# Patient Record
Sex: Female | Born: 1955 | Hispanic: No | Marital: Married | Smoking: Current every day smoker
Health system: Southern US, Community
[De-identification: ages and names within clinical notes are randomized; demographics above are authoritative.]

## PROBLEM LIST (undated history)

## (undated) DIAGNOSIS — I10 Essential (primary) hypertension: Secondary | ICD-10-CM

## (undated) DIAGNOSIS — F0280 Dementia in other diseases classified elsewhere without behavioral disturbance: Secondary | ICD-10-CM

## (undated) DIAGNOSIS — E7800 Pure hypercholesterolemia, unspecified: Secondary | ICD-10-CM

## (undated) DIAGNOSIS — E785 Hyperlipidemia, unspecified: Secondary | ICD-10-CM

## (undated) DIAGNOSIS — G309 Alzheimer's disease, unspecified: Secondary | ICD-10-CM

## (undated) HISTORY — DX: Essential (primary) hypertension: I10

## (undated) HISTORY — DX: Pure hypercholesterolemia, unspecified: E78.00

## (undated) HISTORY — DX: Hyperlipidemia, unspecified: E78.5

## (undated) HISTORY — DX: Dementia in other diseases classified elsewhere, unspecified severity, without behavioral disturbance, psychotic disturbance, mood disturbance, and anxiety: F02.80

## (undated) HISTORY — DX: Alzheimer's disease, unspecified: G30.9

---

## 1998-01-01 ENCOUNTER — Other Ambulatory Visit: Admission: RE | Admit: 1998-01-01 | Discharge: 1998-01-01 | Payer: Self-pay | Admitting: Obstetrics and Gynecology

## 1999-01-03 ENCOUNTER — Other Ambulatory Visit: Admission: RE | Admit: 1999-01-03 | Discharge: 1999-01-03 | Payer: Self-pay | Admitting: Obstetrics and Gynecology

## 2000-01-09 ENCOUNTER — Other Ambulatory Visit: Admission: RE | Admit: 2000-01-09 | Discharge: 2000-01-09 | Payer: Self-pay | Admitting: Obstetrics and Gynecology

## 2008-05-05 ENCOUNTER — Encounter: Payer: Self-pay | Admitting: Gastroenterology

## 2008-05-16 ENCOUNTER — Encounter: Payer: Self-pay | Admitting: Gastroenterology

## 2008-09-03 ENCOUNTER — Ambulatory Visit: Payer: Self-pay | Admitting: Gastroenterology

## 2008-09-27 ENCOUNTER — Ambulatory Visit: Payer: Self-pay | Admitting: Gastroenterology

## 2008-10-09 ENCOUNTER — Telehealth (INDEPENDENT_AMBULATORY_CARE_PROVIDER_SITE_OTHER): Payer: Self-pay | Admitting: *Deleted

## 2008-10-10 ENCOUNTER — Ambulatory Visit: Payer: Self-pay | Admitting: Gastroenterology

## 2008-10-11 ENCOUNTER — Telehealth (INDEPENDENT_AMBULATORY_CARE_PROVIDER_SITE_OTHER): Payer: Self-pay | Admitting: *Deleted

## 2008-10-17 ENCOUNTER — Encounter (INDEPENDENT_AMBULATORY_CARE_PROVIDER_SITE_OTHER): Payer: Self-pay | Admitting: *Deleted

## 2008-11-02 ENCOUNTER — Ambulatory Visit: Payer: Self-pay | Admitting: Gastroenterology

## 2008-11-06 ENCOUNTER — Ambulatory Visit: Payer: Self-pay | Admitting: Gastroenterology

## 2012-08-19 DIAGNOSIS — Z0289 Encounter for other administrative examinations: Secondary | ICD-10-CM

## 2013-10-11 ENCOUNTER — Encounter (INDEPENDENT_AMBULATORY_CARE_PROVIDER_SITE_OTHER): Payer: Self-pay

## 2013-10-11 ENCOUNTER — Encounter: Payer: Self-pay | Admitting: Neurology

## 2013-10-11 ENCOUNTER — Ambulatory Visit (INDEPENDENT_AMBULATORY_CARE_PROVIDER_SITE_OTHER): Payer: 59 | Admitting: Neurology

## 2013-10-11 VITALS — BP 113/70 | HR 89 | Temp 98.6°F | Ht 63.5 in | Wt 229.0 lb

## 2013-10-11 DIAGNOSIS — G478 Other sleep disorders: Secondary | ICD-10-CM

## 2013-10-11 DIAGNOSIS — G4733 Obstructive sleep apnea (adult) (pediatric): Secondary | ICD-10-CM

## 2013-10-11 DIAGNOSIS — F0280 Dementia in other diseases classified elsewhere without behavioral disturbance: Secondary | ICD-10-CM

## 2013-10-11 DIAGNOSIS — R400 Somnolence: Secondary | ICD-10-CM

## 2013-10-11 DIAGNOSIS — G309 Alzheimer's disease, unspecified: Secondary | ICD-10-CM

## 2013-10-11 DIAGNOSIS — F518 Other sleep disorders not due to a substance or known physiological condition: Secondary | ICD-10-CM

## 2013-10-11 DIAGNOSIS — G4710 Hypersomnia, unspecified: Secondary | ICD-10-CM

## 2013-10-11 DIAGNOSIS — E669 Obesity, unspecified: Secondary | ICD-10-CM

## 2013-10-11 NOTE — Patient Instructions (Signed)

## 2013-10-11 NOTE — Progress Notes (Signed)
Subjective:    Patient ID: Barbara Gill is a 58 y.o. female.  HPI    Star Age, MD, PhD [facility] [address] [place], [place] [address]  Dear Dr. Brigitte Pulse,  I saw your patient, Barbara Gill, upon your kind request in my neurologic clinic today for initial consultation of her sleep disorder, in particular, concern for underlying obstructive sleep apnea. The patient is accompanied by her husband today. As you know, Barbara Gill is a 58 year old right-handed woman with an underlying medical history of hypertension, hyperlipidemia, smoking, advanced Alderfer onset AD, anxiety, depression, ADD and chronic back pain, as well as severe obesity, who reports snoring and daytime somnolence. She has gained a lot of weight in the realm of 70 lb in the last year. 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  Her father had OSA and her brother has OSA. She has had some breathing pauses. She does not have a morning headache. She is a restless sleeper. She denies RLS symptoms and does not kick in her sleep. She talks in her sleep. She gets up at night 2 times per night, and sometimes goes to the living room and sleeps on the sofa. She goes to bed around 11 PM and falls asleep quickly. Her wake time is around 7-9 AM. She feels sluggish in the morning and naps during the day. She seems to sleep more in the last 18 months.    She denies cataplexy, sleep paralysis, hypnagogic or hypnopompic hallucinations, or sleep attacks. She does not report any vivid dreams, nightmares, dream enactments, or parasomnias, such as sleep talking or sleep walking. The patient has not had a sleep study or a home sleep test.  Her bedroom is usually dark and cool. There is no TV in the bedroom.   Her Past Medical History Is Significant For: Past Medical History  Diagnosis Date  . Hypertension   . High cholesterol   . Alzheimer's disease   . Hyperlipemia     Her Past  Surgical History Is Significant For: History reviewed. No pertinent past surgical history.  Her Family History Is Significant For: Family History  Problem Relation Age of Onset  . Cancer Father   . Hypothyroidism Father   . Hypertension Mother     borderline  . Diabetes Mother   . Breast cancer Mother     Her Social History Is Significant For: History   Social History  . Marital Status: Married    Spouse Name: [name]    Number of Children: 2  . Years of Education: 16   Occupational History  .      not employed   Social History Main Topics  . Smoking status: Current Every Day Smoker  . Smokeless tobacco: Never Used     Comment: 4 packs weekly  . Alcohol Use: Yes     Comment: occas.,once every two months  . Drug Use: No  . Sexual Activity: None   Other Topics Concern  . None   Social History Narrative   Patient is right handed and resides with husband    Her Allergies Are:  Allergies  Allergen Reactions  . Seasonal Ic [Cholestatin]     Runny nose, itchy eyes  . Poison Ivy Extract [Extract Of Poison Ivy] Rash  :   Her Current Medications Are:  Outpatient Encounter Prescriptions as of 10/11/2013  Medication Sig  . aspirin 81 MG tablet Take 81 mg by  mouth daily.  Marland Kitchen atorvastatin (LIPITOR) 20 MG tablet Take 20 mg by mouth at bedtime.  . Calcium-Vit D3-Phytosterols ([place]) [id number] MG-UNIT-MG TABS Take by mouth daily.  Marland Kitchen donepezil (ARICEPT) 10 MG tablet Take 10 mg by mouth at bedtime.  Marland Kitchen escitalopram (LEXAPRO) 10 MG tablet Take 10 mg by mouth daily.  . hydrochlorothiazide (HYDRODIURIL) 25 MG tablet Take 25 mg by mouth daily.  . Hydrocortisone (CORTIZONE-10 ECZEMA EX) Apply 1 % topically as needed.  . Memantine HCl ER (NAMENDA XR) 28 MG CP24 Take 28 mg by mouth daily.  . Multiple Vitamin (MULTIVITAMIN) capsule Take 1 capsule by mouth daily.  . Omega-3 Krill Oil 1000 MG CAPS Take by mouth daily.  Marland Kitchen telmisartan (MICARDIS) 80 MG tablet Take 80  mg by mouth daily.  :  Review of Systems:  Out of a complete 14 point review of systems, all are reviewed and negative with the exception of these symptoms as listed below:   Review of Systems  Constitutional: Positive for fatigue.       Weight gain  Respiratory: Positive for cough and wheezing.   Musculoskeletal:       Joint pain  Neurological:       Memory loss, confusion,slurred speech,snoring  Psychiatric/Behavioral:       Depression,anxiety,too much sleep    Objective:  Neurologic Exam  Physical Exam Physical Examination:   Filed Vitals:   10/11/13 1112  BP: 113/70  Pulse: 89  Temp: 98.6 F (37 C)    General Examination: The patient is a very pleasant 58 y.o. female in no acute distress. She appears well-developed and well-nourished and well groomed.   HEENT: Normocephalic, atraumatic, pupils are equal, round and reactive to light and accommodation. Funduscopic exam is normal with sharp disc margins noted. Extraocular tracking is good without limitation to gaze excursion or nystagmus noted. Normal smooth pursuit is noted. Hearing is grossly intact. Tympanic membranes are clear bilaterally. Face is symmetric with normal facial animation and normal facial sensation. Speech is clear with no dysarthria noted. There is no hypophonia. There is no lip, neck/head, jaw or voice tremor. Neck is supple with full range of passive and active motion. There are no carotid bruits on auscultation. Oropharynx exam reveals: mild mouth dryness, adequate dental hygiene and moderate airway crowding, due to larger tongue and thick soft palate and tonsils in place. Mallampati is class III. Tongue protrudes centrally and palate elevates symmetrically. Tonsils are 1+ in size. Neck size is 16.5 inches. She has a very mild overbite. Nasal inspection reveals no significant nasal mucosal bogginess or redness and no septal deviation.   Chest: Clear to auscultation without wheezing, rhonchi or crackles  noted.  Heart: S1+S2+0, regular and normal without murmurs, rubs or gallops noted.   Abdomen: Soft, non-tender and non-distended with normal bowel sounds appreciated on auscultation.  Extremities: There is no pitting edema in the distal lower extremities bilaterally. Pedal pulses are intact.  Skin: Warm and dry without trophic changes noted. There are no varicose veins.  Musculoskeletal: exam reveals no obvious joint deformities, tenderness or joint swelling or erythema.   Neurologically:  Mental status: The patient is awake, alert and oriented to self only. Her immediate is significantly impaired and remote memory seems good. There is no evidence of aphasia, agnosia, apraxia or anomia. Speech is clear with normal prosody and enunciation. Thought process is linear. Mood is normal and affect is normal.  Cranial nerves II - XII are as described above under HEENT  exam. In addition: shoulder shrug is normal with equal shoulder height noted. Motor exam: Normal bulk, strength and tone is noted. There is no drift, tremor or rebound. Romberg is negative. Reflexes are 2+ throughout. Babinski: Toes are flexor bilaterally. Fine motor skills and coordination: intact with normal finger taps, normal hand movements, normal rapid alternating patting, normal foot taps and normal foot agility.  Cerebellar testing: No dysmetria or intention tremor on finger to nose testing. Heel to shin is unremarkable bilaterally. There is no truncal or gait ataxia.  Sensory exam: intact to light touch, pinprick, vibration, temperature sense in the upper and lower extremities.  Gait, station and balance: She stands easily. No veering to one side is noted. No leaning to one side is noted. Posture is age-appropriate and stance is narrow based. Gait shows normal stride length and normal pace. No problems turning are noted. She turns en bloc. Tandem walk is unremarkable. Intact toe and heel stance is noted.               Assessment  and Plan:   In summary, JAYLAA GALLION is a very pleasant 59 y.o.-year old female with a history and physical exam concerning for obstructive sleep apnea (OSA). I had a long chat with the patient and her husband about my findings and the diagnosis of OSA, its prognosis and treatment options. We talked about medical treatments, surgical interventions and non-pharmacological approaches. I explained in particular the risks and ramifications of untreated moderate to severe OSA, especially with respect to developing cardiovascular disease down the Road, including congestive heart failure, difficult to treat hypertension, cardiac arrhythmias, or stroke. Even type 2 diabetes has, in part, been linked to untreated OSA. Symptoms of untreated OSA include daytime sleepiness, memory problems, mood irritability and mood disorder such as depression and anxiety, lack of energy, as well as recurrent headaches, especially morning headaches. We talked about smoking cessation and trying to maintain a healthy lifestyle in general, as well as the importance of weight control. I encouraged the patient to eat healthy, exercise daily and keep well hydrated, to keep a scheduled bedtime and wake time routine, to not skip any meals and eat healthy snacks in between meals. I advised the patient not to drive when feeling sleepy. I recommended the following at this time: sleep study with potential positive airway pressure titration. (We will score hypopneas at 4% and split the sleep study into diagnostic and treatment portion, if the estimated. 2 hour AHI is >20/h). Her husband will have to stay with her due to her memory disorder.   I explained the sleep test procedure to the patient and also outlined possible surgical and non-surgical treatment options of OSA, including the use of a custom-made dental device (which would require a referral to a specialist dentist or oral surgeon), upper airway surgical options, such as pillar implants,  radiofrequency surgery, tongue base surgery, and UPPP (which would involve a referral to an ENT surgeon). Rarely, jaw surgery such as mandibular advancement may be considered.  I also explained the CPAP treatment option to the patient, who indicated that she would be willing to try CPAP if the need arises. I explained the importance of being compliant with PAP treatment, not only for insurance purposes but primarily to improve Her symptoms, and for the patient's long term health benefit, including to reduce Her cardiovascular risks. I answered all their questions today and the patient and her husband were in agreement. I would like to see her back after  the sleep study is completed and encouraged her to call with any interim questions, concerns, problems or updates.   Thank you very much for allowing me to participate in the care of this nice patient. If I can be of any further assistance to you please do not hesitate to call me at [phone].  Sincerely,   Star Age, MD, PhD

## 2013-11-27 ENCOUNTER — Ambulatory Visit (INDEPENDENT_AMBULATORY_CARE_PROVIDER_SITE_OTHER): Payer: 59 | Admitting: Neurology

## 2013-11-27 DIAGNOSIS — G479 Sleep disorder, unspecified: Secondary | ICD-10-CM

## 2013-11-27 DIAGNOSIS — G4733 Obstructive sleep apnea (adult) (pediatric): Secondary | ICD-10-CM

## 2013-11-27 DIAGNOSIS — G4761 Periodic limb movement disorder: Secondary | ICD-10-CM

## 2013-11-27 DIAGNOSIS — G4720 Circadian rhythm sleep disorder, unspecified type: Secondary | ICD-10-CM

## 2013-11-27 DIAGNOSIS — G4734 Idiopathic sleep related nonobstructive alveolar hypoventilation: Secondary | ICD-10-CM

## 2013-11-27 NOTE — Sleep Study (Signed)
Please see the scanned sleep study interpretation located in the Procedure tab within the Chart Review section. 

## 2013-12-06 ENCOUNTER — Telehealth: Payer: Self-pay | Admitting: Neurology

## 2013-12-06 DIAGNOSIS — G4733 Obstructive sleep apnea (adult) (pediatric): Secondary | ICD-10-CM

## 2013-12-06 NOTE — Telephone Encounter (Signed)
Please call and notify the husband that the recent sleep study did confirm the diagnosis of obstructive sleep apnea and that I recommend treatment for this in the form of CPAP. This will require a repeat sleep study for proper titration and mask fitting. Please explain to patient - better husband - and arrange for a CPAP titration study. Please try to find an appointment as soon as possible for titration because of significant oxygen desaturations. I have placed an order in the chart. Thanks, Star Age, MD, PhD [facility])

## 2013-12-07 ENCOUNTER — Encounter: Payer: Self-pay | Admitting: Neurology

## 2013-12-07 NOTE — Telephone Encounter (Signed)
Patient was contacted and provided the results of her sleep study via her son [name] which revealed obstructive sleep apnea.  Patient's son was informed a CPAP titration study had been recommended and an appointment was made for Friday Dec. 18th at 08:00 pm.  Patient was mailed a copy of the results and a copy was sent to Dr. Marton Redwood.

## 2014-01-12 ENCOUNTER — Ambulatory Visit (INDEPENDENT_AMBULATORY_CARE_PROVIDER_SITE_OTHER): Payer: 59 | Admitting: Neurology

## 2014-01-12 VITALS — BP 144/80 | HR 88

## 2014-01-12 DIAGNOSIS — G4761 Periodic limb movement disorder: Secondary | ICD-10-CM

## 2014-01-12 DIAGNOSIS — G479 Sleep disorder, unspecified: Secondary | ICD-10-CM

## 2014-01-12 DIAGNOSIS — G4733 Obstructive sleep apnea (adult) (pediatric): Secondary | ICD-10-CM

## 2014-01-13 NOTE — Sleep Study (Signed)
Please see the attached sleep study interpretation located in the Procedure tab within the chart review section

## 2014-01-22 ENCOUNTER — Telehealth: Payer: Self-pay | Admitting: Neurology

## 2014-01-22 ENCOUNTER — Encounter: Payer: Self-pay | Admitting: Neurology

## 2014-01-22 DIAGNOSIS — G4733 Obstructive sleep apnea (adult) (pediatric): Secondary | ICD-10-CM

## 2014-01-22 NOTE — Telephone Encounter (Signed)
Please call and inform patient that I have entered an order for treatment with PAP. She did well during the latest sleep study with CPAP. We will, therefore, arrange for a machine for home use through a DME (durable medical equipment) company of Her choice; and I will see the patient back in follow-up in about 6 weeks. Please also explain to the patient that I will be looking out for compliance data downloaded from the machine, which can be done remotely through a modem at times or stored on an SD card in the back of the machine. At the time of the followup appointment we will discuss sleep study results and how it is going with PAP treatment at home. Please advise patient to bring Her machine at the time of the visit; at least for the first visit, even though this is cumbersome. Bringing the machine for every visit after that may not be needed, but often helps for the first visit. Please also make sure, the patient has a follow-up appointment with me in about 6 weeks from the setup date, thanks.   Mohanad Carsten, MD, PhD [facility] ([facility])  

## 2014-01-23 ENCOUNTER — Encounter: Payer: Self-pay | Admitting: *Deleted

## 2014-01-23 NOTE — Telephone Encounter (Signed)
Patient's husband returned my call and was provided the results of his wife's CPAP titration study.  He was informed that the CPAP treatment was effective in treatment and that CPAP therapy was recommended for home use.  Patient was referred to [employer] for CPAP set up.  The patient gave verbal permission to mail a copy of her test results.  Dr. Marton Redwood was faxed a copy of the results.   Patient instructed to contact our office 6-8 weeks post set up to schedule a follow up appointment.

## 2014-03-28 NOTE — Progress Notes (Signed)
Quick Note:  I reviewed the patient's CPAP compliance data from 01/30/2014 through 02/28/2014 which is a total of 30 days during which time she used her machine every night with percent used days greater than 4 hours of 77%, indicating adequate compliance with an average usage of 5 hours and 17 minutes and residual AHI low at 1.3 per hour and leak acceptable with the 95th percentile at 22.2 L/m on a pressure of 10 cm with EPR of 2. I will review this data with her during her appointment on 04/09/2014. No action required at this time. Star Age, MD, PhD [facility] ([facility])  ______

## 2014-04-09 ENCOUNTER — Ambulatory Visit (INDEPENDENT_AMBULATORY_CARE_PROVIDER_SITE_OTHER): Payer: 59 | Admitting: Neurology

## 2014-04-09 ENCOUNTER — Encounter: Payer: Self-pay | Admitting: Neurology

## 2014-04-09 VITALS — BP 99/66 | HR 72 | Temp 98.4°F | Resp 24 | Ht 63.5 in | Wt 232.0 lb

## 2014-04-09 DIAGNOSIS — G4733 Obstructive sleep apnea (adult) (pediatric): Secondary | ICD-10-CM | POA: Diagnosis not present

## 2014-04-09 DIAGNOSIS — G309 Alzheimer's disease, unspecified: Secondary | ICD-10-CM | POA: Diagnosis not present

## 2014-04-09 DIAGNOSIS — Z9989 Dependence on other enabling machines and devices: Principal | ICD-10-CM

## 2014-04-09 DIAGNOSIS — F0280 Dementia in other diseases classified elsewhere without behavioral disturbance: Secondary | ICD-10-CM

## 2014-04-09 NOTE — Patient Instructions (Signed)

## 2014-04-09 NOTE — Progress Notes (Signed)
Subjective:    Patient ID: Barbara Gill is a 59 y.o. female.  HPI     Interim history:   Barbara Gill is a very pleasant 59 year old right-handed woman with an underlying medical history of hypertension, hyperlipidemia, smoking, advanced Guevara onset AD, anxiety, depression, ADD and chronic back pain, as well as severe obesity, who presents for follow-up consultation of her obstructive sleep apnea. The patient is unaccompanied today. I first met her on 10/11/2013 at the request of her primary care physician, at which time she reported snoring and daytime somnolence as well as recent weight gain. I invited her back for sleep study. She had a baseline sleep study on 11/27/2013 followed by a CPAP titration study on 01/12/2014 and underwent over her test results with her and her husband in detail today. Her baseline sleep study from 11/27/2013 showed a sleep efficiency of 74% with a latency to sleep of 43.5 minutes and wake after sleep onset of 86 minutes with severe sleep fragmentation noted. She had absence of slow-wave and REM sleep. She had mild PLMS with very mild arousals. She had mild to moderate snoring. Her total AHI was 17.4 per hour. Average oxygen saturation was 88% only. Oxygen nadir was 78%. Based on her test results I asked her to come back for CPAP titration. She had this on 01/12/2014: Sleep efficiency was 90.2%, latency to sleep was 16.5 minutes, wake after sleep onset 28.5 minutes with moderate and mild sleep fragmentation noted. She had 23.1% of slow-wave sleep and 13.6% of REM sleep with a prolonged REM latency. CPAP was titrated from 5-10 cm of water pressure. Her AHI was 0 per hour on a pressure of 10. She had minimal periodic leg movements.  Today, 04/09/2014, I reviewed her compliance data from 03/07/2014 through 04/05/2014 which is a total of 30 days during which time she used her machine 29 days with percent used days greater than 4 hours of 50%, indicating suboptimal compliance  with an average usage of 4 hours and 3 minutes on a pressure of 10 cm with EPR of 2. Residual AHI at 2.5 per hour, leak acceptable with the 95th percentile at 19.4 L/m.   Today, 04/09/2014, her history is provided primarily by her husband and she provides some history but is only able to answer some brief simple questions. She feels that she is sleeping better. He feels that she is resting more soundly, her snoring is virtually gone, she seems to be sleeping with less interruptions. She had a checkup with her primary care physician. She has had no recent medication changes. It is difficult for her to maintain sleep with the mask on as she has a tendency to pull off the mask. He has issues with pain in his sleep is disrupted so he does not always catch it when she takes the mask off.  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  Her father had OSA and her brother has OSA. She has had some breathing pauses. She does not have a morning headache. She is a restless sleeper. She denies RLS symptoms and does not kick in her sleep. She talks in her sleep. She gets up at night 2 times per night, and sometimes goes to the living room and sleeps on the sofa. She goes to bed around 11 PM and falls asleep quickly. Her wake time is around 7-9 AM. She feels sluggish in the morning and naps during the day. She seems  to sleep more in the last 18 months.     She denies cataplexy, sleep paralysis, hypnagogic or hypnopompic hallucinations, or sleep attacks. She does not report any vivid dreams, nightmares, dream enactments, or parasomnias, such as sleep talking or sleep walking. The patient has not had a sleep study or a home sleep test.   Her bedroom is usually dark and cool. There is no TV in the bedroom.    Her Past Medical History Is Significant For: Past Medical History  Diagnosis Date  . Hypertension   . High cholesterol   . Alzheimer's disease   . Hyperlipemia     His Past  Surgical History Is Significant For: No past surgical history on file.  Her Family History Is Significant For: Family History  Problem Relation Age of Onset  . Cancer Father   . Hypothyroidism Father   . Hypertension Mother     borderline  . Diabetes Mother   . Breast cancer Mother     Her Social History Is Significant For: History   Social History  . Marital Status: Married    Spouse Name: Barbara Gill  . Number of Children: 2  . Years of Education: 16   Occupational History  .      not employed   Social History Main Topics  . Smoking status: Current Every Day Smoker  . Smokeless tobacco: Never Used     Comment: 4 packs weekly  . Alcohol Use: 0.0 oz/week    0 Standard drinks or equivalent per week     Comment: occas.,once every two months  . Drug Use: No  . Sexual Activity: Not on file   Other Topics Concern  . None   Social History Narrative   Patient is right handed and resides with husband    Her Allergies Are:  Allergies  Allergen Reactions  . Seasonal Ic [Cholestatin]     Runny nose, itchy eyes  . Poison Ivy Extract [Extract Of Poison Ivy] Rash  :   Her Current Medications Are:  Outpatient Encounter Prescriptions as of 04/09/2014  Medication Sig  . aspirin 81 MG tablet Take 81 mg by mouth daily.  Marland Kitchen atorvastatin (LIPITOR) 20 MG tablet Take 20 mg by mouth at bedtime.  . Calcium-Vit D3-Phytosterols (Cortez) [phone] MG-UNIT-MG TABS Take by mouth daily.  . cetirizine (ZYRTEC) 10 MG tablet Take 10 mg by mouth daily.  Marland Kitchen donepezil (ARICEPT) 10 MG tablet Take 10 mg by mouth at bedtime.  Marland Kitchen escitalopram (LEXAPRO) 10 MG tablet Take 10 mg by mouth daily.  . hydrochlorothiazide (HYDRODIURIL) 25 MG tablet Take 25 mg by mouth daily.  . Hydrocortisone (CORTIZONE-10 ECZEMA EX) Apply 1 % topically as needed.  . Memantine HCl ER (NAMENDA XR) 28 MG CP24 Take 28 mg by mouth daily.  . Multiple Vitamin (MULTIVITAMIN) capsule Take 1 capsule by mouth daily.   . Omega-3 Krill Oil 1000 MG CAPS Take by mouth daily.  Marland Kitchen telmisartan (MICARDIS) 80 MG tablet Take 80 mg by mouth daily.  :  Review of Systems:  Out of a complete 14 point review of systems, all are reviewed and negative with the exception of these symptoms as listed below:   Review of Systems  Respiratory: Positive for cough.   Musculoskeletal:       Joint pain   Allergic/Immunologic: Positive for environmental allergies.  Neurological:       Apnea, Daytime sleepiness, Snoring, Memory loss, Speech Difficulty   Psychiatric/Behavioral: Positive for confusion and decreased  concentration.    Objective:  Neurologic Exam  Physical Exam Physical Examination:   Filed Vitals:   04/09/14 0813  BP: 99/66  Pulse: 72  Temp: 98.4 F (36.9 C)  Resp: 24    General Examination: The patient is a very pleasant 59 y.o. female in no acute distress. She appears well-developed and well-nourished and well groomed.   HEENT: Normocephalic, atraumatic, pupils are equal, round and reactive to light and accommodation. Funduscopic exam is normal with sharp disc margins noted. Extraocular tracking is good without limitation to gaze excursion or nystagmus noted. Normal smooth pursuit is noted. Hearing is grossly intact. Face is symmetric with normal facial animation and normal facial sensation. Speech is clear with no dysarthria noted. There is no hypophonia. There is no lip, neck/head, jaw or voice tremor. Neck is supple with full range of passive and active motion. There are no carotid bruits on auscultation. Oropharynx exam reveals: mild mouth dryness, adequate dental hygiene and moderate airway crowding, due to larger tongue and thick soft palate and tonsils in place. Mallampati is class III. Tongue protrudes centrally and palate elevates symmetrically. Tonsils are 1+ in size. She has a very mild overbite. Nasal inspection reveals no significant nasal mucosal bogginess or redness and no septal deviation.    Chest: Clear to auscultation without wheezing, rhonchi or crackles noted.  Heart: S1+S2+0, regular and normal without murmurs, rubs or gallops noted.   Abdomen: Soft, non-tender and non-distended with normal bowel sounds appreciated on auscultation.  Extremities: There is no pitting edema in the distal lower extremities bilaterally. Pedal pulses are intact.  Skin: Warm and dry without trophic changes noted. There are no varicose veins.  Musculoskeletal: exam reveals no obvious joint deformities, tenderness or joint swelling or erythema.   Neurologically:  Mental status: The patient is awake, alert and oriented to self only. Her immediate is significantly impaired and remote memory seems good. There is no evidence of aphasia, agnosia, apraxia or anomia. Speech is clear with normal prosody and enunciation. Thought process is linear. Mood is normal and affect is normal.  Cranial nerves II - XII are as described above under HEENT exam. In addition: shoulder shrug is normal with equal shoulder height noted. Motor exam: Normal bulk, strength and tone is noted. There is no drift, tremor or rebound. Romberg is negative. Reflexes are 2+ throughout. Babinski: Toes are flexor bilaterally. Fine motor skills and coordination: intact with normal finger taps, normal hand movements, normal rapid alternating patting, normal foot taps and normal foot agility.  Cerebellar testing: No dysmetria or intention tremor on finger to nose testing. Heel to shin is unremarkable bilaterally. There is no truncal or gait ataxia.  Sensory exam: intact to light touch, pinprick, vibration, temperature sense in the upper and lower extremities.  Gait, station and balance: She stands easily. No veering to one side is noted. No leaning to one side is noted. Posture is age-appropriate and stance is narrow based. Gait shows normal stride length and normal pace. No problems turning are noted. She turns en bloc.               Assessment and Plan:   In summary, Barbara Gill is a very pleasant 59 year old female with an underlying medical history of hypertension, hyperlipidemia, smoking, advanced [place] onset AD, anxiety, depression, ADD and chronic back pain, as well as severe obesity, who presents for follow-up consultation of her moderate obstructive sleep apnea, now on treatment with CPAP. Today, we talked about her sleep  test results in detail. Her physical exam is stable. We talked about her compliance data and while she is suboptimally compliant as far as potential goes, she has done really well considering her medical issues. I congratulated her and her husband on the treatment adherence. I would like for her to try to maintain the mask throughout the night and he will work on this with her. As far as her memory loss, she has severe memory loss and a history of advanced Bogucki onset Alzheimer's disease for which she is on dual therapy. This is maintained by her primary care physician. From a sleep apnea standpoint, I talked to them about maintaining treatment with CPAP and how well she has done, thankfully she feels improved in her sleep and he agrees that she seems to sleep more soundly and with less interruptions. She does have to get up to use her bathroom once per night. We again talked about untreated OSA. Symptoms of untreated OSA include daytime sleepiness, memory problems, mood irritability and mood disorder such as depression and anxiety, lack of energy, as well as recurrent headaches, especially morning headaches. I explained the importance of being compliant with PAP treatment, not only for insurance purposes but primarily to improve Her symptoms, and for the patient's long term health benefit, including to reduce Her cardiovascular risks. I would like to see her back in 6 months, sooner if the need arises. I answered all his questions today and the patient and her husband were in agreement.   Most of my 25  minute visit today was spent in counseling and coordination of care, explaining test results, explaining compliance data and explaining the diagnosis of OSA, its prognosis and treatment options.

## 2014-04-11 ENCOUNTER — Encounter: Payer: Self-pay | Admitting: Neurology

## 2014-07-18 NOTE — Telephone Encounter (Signed)
Opened in error

## 2014-08-03 ENCOUNTER — Encounter: Payer: Self-pay | Admitting: Gastroenterology

## 2014-10-04 ENCOUNTER — Telehealth: Payer: Self-pay

## 2014-10-04 NOTE — Telephone Encounter (Signed)
I left a message asking if patient can change appt time on 9/14 from 11:45 to 10:30 that day. I asked for her to call back if she can change time, otherwise we will see her at 11:45.

## 2014-10-08 NOTE — Telephone Encounter (Signed)
There has been a cancellation on the schedule. If patient can come in at 11:30, that would be helpful.

## 2014-10-10 ENCOUNTER — Encounter: Payer: Self-pay | Admitting: Neurology

## 2014-10-10 ENCOUNTER — Ambulatory Visit (INDEPENDENT_AMBULATORY_CARE_PROVIDER_SITE_OTHER): Payer: 59 | Admitting: Neurology

## 2014-10-10 VITALS — BP 112/60 | HR 78 | Resp 22 | Ht 63.5 in | Wt 235.0 lb

## 2014-10-10 DIAGNOSIS — G309 Alzheimer's disease, unspecified: Secondary | ICD-10-CM

## 2014-10-10 DIAGNOSIS — E669 Obesity, unspecified: Secondary | ICD-10-CM

## 2014-10-10 DIAGNOSIS — G4733 Obstructive sleep apnea (adult) (pediatric): Secondary | ICD-10-CM | POA: Diagnosis not present

## 2014-10-10 DIAGNOSIS — Z9989 Dependence on other enabling machines and devices: Principal | ICD-10-CM

## 2014-10-10 DIAGNOSIS — F0280 Dementia in other diseases classified elsewhere without behavioral disturbance: Secondary | ICD-10-CM

## 2014-10-10 NOTE — Patient Instructions (Signed)

## 2014-10-10 NOTE — Progress Notes (Signed)
Subjective:    Patient ID: Barbara Gill is a 59 y.o. female.  HPI     Interim history:  Barbara Gill is a very pleasant 59 year old right-handed woman with an underlying medical history of hypertension, hyperlipidemia, smoking, advanced Prabhakar onset AD, anxiety, depression, ADD and chronic back pain, as well as severe obesity, who presents for follow-up consultation of Barbara obstructive sleep apnea, on treatment with CPAP. The patient is accompanied by Barbara Gill again today. I last saw Barbara on 04/09/2014, at which time she was not fully compliant with CPAP treatment. She reported very little history herself. Barbara Gill was providing Barbara history. He felt that she was sleeping better and resting more soundly. It was difficult for Barbara to maintain the mask on throughout the night as she had a tendency to pull the mask off.   Today, 10/10/2014: I reviewed Barbara CPAP compliance data from 09/09/2014 through 10/08/2014 which is a total of 30 days during which time she used Barbara machine 29 days with percent used days greater than 4 hours at 77%, indicating good compliance, improved from last time, with an average usage of 5 hours and 27 minutes, residual AHI low at 1.9 per hour, leak acceptable with the 95th percentile at 17 L/m on a pressure of 10 cm with EPR of 2.  Today, 10/10/2014: She reports doing well. Barbara Gill provides Barbara history mainly. He reports, that she has been able to tolerate the CPAP better. She needs new supplies. She was started on treatment in January 2016. He had recent back surgery. She has gained a little bit of weight. She does try to hydrate but does not always drink enough water. She likes to drink sodas and tea. She is doing well sleep wise.  Previously:  I first met Barbara on 10/11/2013 at the request of Barbara primary care physician, at which time she reported snoring and daytime somnolence as well as recent weight gain. I invited Barbara back for sleep study. She had a baseline sleep  study on 11/27/2013 followed by a CPAP titration study on 01/12/2014 and underwent over Barbara test results with Barbara and Barbara Gill in detail today. Barbara baseline sleep study from 11/27/2013 showed a sleep efficiency of 74% with a latency to sleep of 43.5 minutes and wake after sleep onset of 86 minutes with severe sleep fragmentation noted. She had absence of slow-wave and REM sleep. She had mild PLMS with very mild arousals. She had mild to moderate snoring. Barbara total AHI was 17.4 per hour. Average oxygen saturation was 88% only. Oxygen nadir was 78%. Based on Barbara test results I asked Barbara to come back for CPAP titration. She had this on 01/12/2014: Sleep efficiency was 90.2%, latency to sleep was 16.5 minutes, wake after sleep onset 28.5 minutes with moderate and mild sleep fragmentation noted. She had 23.1% of slow-wave sleep and 13.6% of REM sleep with a prolonged REM latency. CPAP was titrated from 5-10 cm of water pressure. Barbara AHI was 0 per hour on a pressure of 10. She had minimal periodic leg movements.  I reviewed Barbara compliance data from 03/07/2014 through 04/05/2014 which is a total of 30 days during which time she used Barbara machine 29 days with percent used days greater than 4 hours of 50%, indicating suboptimal compliance with an average usage of 4 hours and 3 minutes on a pressure of 10 cm with EPR of 2. Residual AHI at 2.5 per hour, leak acceptable with the 95th percentile at 19.4  L/m.   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   Barbara father had OSA and Barbara brother has OSA. She has had some breathing pauses. She does not have a morning headache. She is a restless sleeper. She denies RLS symptoms and does not kick in Barbara sleep. She talks in Barbara sleep. She gets up at night 2 times per night, and sometimes goes to the living room and sleeps on the sofa. She goes to bed around 11 PM and falls asleep quickly. Barbara wake time is around 7-9 AM. She feels sluggish in the morning and naps  during the day. She seems to sleep more in the last 18 months.     She denies cataplexy, sleep paralysis, hypnagogic or hypnopompic hallucinations, or sleep attacks. She does not report any vivid dreams, nightmares, dream enactments, or parasomnias, such as sleep talking or sleep walking. The patient has not had a sleep study or a home sleep test.   Barbara bedroom is usually dark and cool. There is no TV in the bedroom.   Barbara Past Medical History Is Significant For: Past Medical History  Diagnosis Date  . Hypertension   . High cholesterol   . Alzheimer's disease   . Hyperlipemia     Barbara Past Surgical History Is Significant For: No past surgical history on file.  Barbara Family History Is Significant For: Family History  Problem Relation Age of Onset  . Cancer Father   . Hypothyroidism Father   . Hypertension Mother     borderline  . Diabetes Mother   . Breast cancer Mother     Barbara Social History Is Significant For: Social History   Social History  . Marital Status: Married    Spouse Name: Barbara Gill  . Number of Children: 2  . Years of Education: 16   Occupational History  .      not employed   Social History Main Topics  . Smoking status: Current Every Day Smoker  . Smokeless tobacco: Never Used     Comment: 4 packs weekly  . Alcohol Use: 0.0 oz/week    0 Standard drinks or equivalent per week     Comment: occas.,once every two months  . Drug Use: No  . Sexual Activity: Not Asked   Other Topics Concern  . None   Social History Narrative   Patient is right handed and resides with Gill    Barbara Allergies Are:  Allergies  Allergen Reactions  . Seasonal Ic [Cholestatin]     Runny nose, itchy eyes  . Poison Ivy Extract [Extract Of Poison Ivy] Rash  :   Barbara Current Medications Are:  Outpatient Encounter Prescriptions as of 10/10/2014  Medication Sig  . aspirin 81 MG tablet Take 81 mg by mouth daily.  Marland Kitchen atorvastatin (LIPITOR) 20 MG tablet Take 20 mg by mouth at  bedtime.  . Calcium-Vit D3-Phytosterols ([place]) [phone] MG-UNIT-MG TABS Take by mouth daily.  . cetirizine (ZYRTEC) 10 MG tablet Take 10 mg by mouth daily.  Marland Kitchen donepezil (ARICEPT) 10 MG tablet Take 10 mg by mouth at bedtime.  Marland Kitchen escitalopram (LEXAPRO) 10 MG tablet Take 10 mg by mouth daily.  Marland Kitchen guaiFENesin (MUCINEX) 600 MG 12 hr tablet Take by mouth 2 (two) times daily.  . hydrochlorothiazide (HYDRODIURIL) 25 MG tablet Take 25 mg by mouth daily.  . Hydrocortisone (CORTIZONE-10 ECZEMA EX) Apply 1 % topically as needed.  . Memantine HCl ER (NAMENDA XR) 28  MG CP24 Take 28 mg by mouth daily.  . Multiple Vitamin (MULTIVITAMIN) capsule Take 1 capsule by mouth daily.  . Omega-3 Krill Oil 1000 MG CAPS Take by mouth daily.  Marland Kitchen telmisartan (MICARDIS) 80 MG tablet Take 80 mg by mouth daily.   No facility-administered encounter medications on file as of 10/10/2014.  :  Review of Systems:  Out of a complete 14 point review of systems, all are reviewed and negative with the exception of these symptoms as listed below:   Review of Systems  Neurological:       Patient and Gill states that she is doing well on CPAP, would like to talk about getting new supplies.     Objective:  Neurologic Exam  Physical Exam Physical Examination:   Filed Vitals:   10/10/14 1133  BP: 112/60  Pulse: 78  Resp: 22   General Examination: The patient is a very pleasant 59 y.o. female in no acute distress. She appears well-developed and well-nourished and well groomed. She looks to Barbara Gill for most answers.  HEENT: Normocephalic, atraumatic, pupils are equal, round and reactive to light and accommodation. Extraocular tracking is good without limitation to gaze excursion or nystagmus noted. Normal smooth pursuit is noted. Hearing is grossly intact. Face is symmetric with normal facial animation and normal facial sensation. Speech is clear with no dysarthria noted. There is no hypophonia. There  is no lip, neck/head, jaw or voice tremor. Neck is supple with full range of passive and active motion. There are no carotid bruits on auscultation. Oropharynx exam reveals: mild mouth dryness, adequate dental hygiene and moderate airway crowding, due to larger tongue and thick soft palate and tonsils in place. Mallampati is class III. Tongue protrudes centrally and palate elevates symmetrically. Tonsils are 1+ in size.   Chest: Clear to auscultation without wheezing, rhonchi or crackles noted.  Heart: S1+S2+0, regular and normal without murmurs, rubs or gallops noted.   Abdomen: Soft, non-tender and non-distended with normal bowel sounds appreciated on auscultation.  Extremities: There is no pitting edema in the distal lower extremities bilaterally. Pedal pulses are intact.  Skin: Warm and dry without trophic changes noted. There are no varicose veins.  Musculoskeletal: exam reveals no obvious joint deformities, tenderness or joint swelling or erythema.   Neurologically:  Mental status: The patient is awake, alert and oriented to self only. Barbara immediate is significantly impaired and remote memory seems good. There is no evidence of aphasia, agnosia, apraxia or anomia. Speech is clear with normal prosody and enunciation. Thought process is linear. Mood is normal and affect is normal.  Cranial nerves II - XII are as described above under HEENT exam. In addition: shoulder shrug is normal with equal shoulder height noted. Motor exam: Normal bulk, strength and tone is noted. There is no drift, tremor or rebound. Romberg is negative. Reflexes are 2+ throughout. Fine motor skills and coordination: intact with normal finger taps, normal hand movements, normal rapid alternating patting, normal foot taps and normal foot agility.  Cerebellar testing: No dysmetria or intention tremor on finger to nose testing. Heel to shin is unremarkable bilaterally. There is no truncal or gait ataxia.  Sensory exam:  intact to light touch in the upper and lower extremities.  Gait, station and balance: She stands easily. No veering to one side is noted. No leaning to one side is noted. Posture is age-appropriate and stance is narrow based. Gait shows normal stride length and normal pace. No problems turning are noted. She  turns en bloc.              Assessment and Plan:   In summary, Barbara Gill is a very pleasant 59 year old female with an underlying medical history of hypertension, hyperlipidemia, smoking, advanced [place] onset AD, anxiety, depression, ADD and chronic back pain, as well as severe obesity, who presents for follow-up consultation of Barbara moderate obstructive sleep apnea, now on treatment with CPAP. Today, we talked about Barbara most recent compliance data. She has improved in Barbara compliance and is commended for this. She has a stable physical exam. I talked to the patient and Barbara Gill about healthy lifestyle and weight loss. She does not always drink enough water and does not have enough physical activity. Once he is able to walk more after his recent back surgery, he will try to get Barbara to walk with him.  I talked to them about maintaining treatment with CPAP and how well she has done, feeling improved in Barbara sleep and he agrees that she seems to sleep more soundly and with less interruptions. I placed an order for new CPAP supplies. I explained the importance of being compliant with PAP treatment, not only for insurance purposes but primarily to improve Barbara symptoms, and for the patient's long term health benefit, including to reduce Barbara cardiovascular risks. I would like to see Barbara back in 6 months, sooner if the need arises. I answered all his questions today and the patient and Barbara Gill were in agreement.   Most of my 15 minute visit today was spent in counseling and coordination of care, explaining test results, explaining compliance data and explaining the diagnosis of OSA, its prognosis and  treatment options.

## 2015-04-09 ENCOUNTER — Ambulatory Visit: Payer: 59 | Admitting: Neurology

## 2015-04-10 ENCOUNTER — Ambulatory Visit (INDEPENDENT_AMBULATORY_CARE_PROVIDER_SITE_OTHER): Payer: PPO | Admitting: Neurology

## 2015-04-10 ENCOUNTER — Encounter: Payer: Self-pay | Admitting: Neurology

## 2015-04-10 VITALS — BP 126/64 | HR 78 | Resp 22 | Ht 63.5 in | Wt 240.0 lb

## 2015-04-10 DIAGNOSIS — F0280 Dementia in other diseases classified elsewhere without behavioral disturbance: Secondary | ICD-10-CM | POA: Diagnosis not present

## 2015-04-10 DIAGNOSIS — E669 Obesity, unspecified: Secondary | ICD-10-CM

## 2015-04-10 DIAGNOSIS — Z9989 Dependence on other enabling machines and devices: Principal | ICD-10-CM

## 2015-04-10 DIAGNOSIS — G4733 Obstructive sleep apnea (adult) (pediatric): Secondary | ICD-10-CM | POA: Diagnosis not present

## 2015-04-10 DIAGNOSIS — Z7729 Contact with and (suspected ) exposure to other hazardous substances: Secondary | ICD-10-CM

## 2015-04-10 DIAGNOSIS — Z7722 Contact with and (suspected) exposure to environmental tobacco smoke (acute) (chronic): Secondary | ICD-10-CM

## 2015-04-10 DIAGNOSIS — G300 Alzheimer's disease with early onset: Secondary | ICD-10-CM | POA: Diagnosis not present

## 2015-04-10 NOTE — Progress Notes (Signed)
Subjective:    Patient ID: EMALINE KARNES is a 60 y.o. female.  HPI     Interim history:   Ms. Ainley is a very pleasant 60 year old right-handed woman with an underlying medical history of hypertension, hyperlipidemia, smoking, advanced Mcconico onset AD, anxiety, depression, ADD and chronic back pain, as well as severe obesity, who presents for follow-up consultation of her obstructive sleep apnea, on treatment with CPAP. The patient is accompanied by her husband again today. I last saw her on 10/10/2014, at which time she was doing fairly well. She was able to tolerate CPAP better. She had started treatment in January 2016. He reported that he recently had back surgery. She had gained a little bit of weight.  Today, 04/10/2015: I reviewed her CPAP compliance data from 03/10/2015 through 04/08/2015 which is a total of 30 days during which time she used her CPAP 25 days with percent used days greater than 4 hours at only 7%, indicating poor compliance with an average usage for all days of only 1 hour and 27 minutes, residual AHI 4 per hour, leak at times high with the 95th percentile at 41.3 L/m on a pressure of 10 cm with EPR of 2.  Today, 04/10/2015: She reports very little in terms of her history. Her husband has had his own health issues and does not always sleep in the bed. Sometimes he sleeps in the recliner and then she gets up out of bed and sleeps in the couch. While they are able to put the CPAP, almost every night she does not keep it on on most nights be on a couple of hours. This has become a little worse and may be the result of multiple issues including his retirement and lack of sleep schedule, his sleep disturbance secondary to health issues that he has had, and her worsening memory loss.  Previously:  I saw her on 04/09/2014, at which time she was not fully compliant with CPAP treatment. She reported very little history herself. Her husband was providing her history. He felt that  she was sleeping better and resting more soundly. It was difficult for her to maintain the mask on throughout the night as she had a tendency to pull the mask off.   I reviewed her CPAP compliance data from 09/09/2014 through 10/08/2014 which is a total of 30 days during which time she used her machine 29 days with percent used days greater than 4 hours at 77%, indicating good compliance, improved from last time, with an average usage of 5 hours and 27 minutes, residual AHI low at 1.9 per hour, leak acceptable with the 95th percentile at 17 L/m on a pressure of 10 cm with EPR of 2.  I first met her on 10/11/2013 at the request of her primary care physician, at which time she reported snoring and daytime somnolence as well as recent weight gain. I invited her back for sleep study. She had a baseline sleep study on 11/27/2013 followed by a CPAP titration study on 01/12/2014 and underwent over her test results with her and her husband in detail today. Her baseline sleep study from 11/27/2013 showed a sleep efficiency of 74% with a latency to sleep of 43.5 minutes and wake after sleep onset of 86 minutes with severe sleep fragmentation noted. She had absence of slow-wave and REM sleep. She had mild PLMS with very mild arousals. She had mild to moderate snoring. Her total AHI was 17.4 per hour. Average oxygen saturation was 88%  only. Oxygen nadir was 78%. Based on her test results I asked her to come back for CPAP titration. She had this on 01/12/2014: Sleep efficiency was 90.2%, latency to sleep was 16.5 minutes, wake after sleep onset 28.5 minutes with moderate and mild sleep fragmentation noted. She had 23.1% of slow-wave sleep and 13.6% of REM sleep with a prolonged REM latency. CPAP was titrated from 5-10 cm of water pressure. Her AHI was 0 per hour on a pressure of 10. She had minimal periodic leg movements.  I reviewed her compliance data from 03/07/2014 through 04/05/2014 which is a total of 30 days during  which time she used her machine 29 days with percent used days greater than 4 hours of 50%, indicating suboptimal compliance with an average usage of 4 hours and 3 minutes on a pressure of 10 cm with EPR of 2. Residual AHI at 2.5 per hour, leak acceptable with the 95th percentile at 19.4 L/m.   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   Her father had OSA and her brother has OSA. She has had some breathing pauses. She does not have a morning headache. She is a restless sleeper. She denies RLS symptoms and does not kick in her sleep. She talks in her sleep. She gets up at night 2 times per night, and sometimes goes to the living room and sleeps on the sofa. She goes to bed around 11 PM and falls asleep quickly. Her wake time is around 7-9 AM. She feels sluggish in the morning and naps during the day. She seems to sleep more in the last 18 months.     She denies cataplexy, sleep paralysis, hypnagogic or hypnopompic hallucinations, or sleep attacks. She does not report any vivid dreams, nightmares, dream enactments, or parasomnias, such as sleep talking or sleep walking. The patient has not had a sleep study or a home sleep test.   Her bedroom is usually dark and cool. There is no TV in the bedroom.     Her Past Medical History Is Significant For: Past Medical History  Diagnosis Date  . Hypertension   . High cholesterol   . Alzheimer's disease   . Hyperlipemia     Her Past Surgical History Is Significant For: No past surgical history on file.  Her Family History Is Significant For: Family History  Problem Relation Age of Onset  . Cancer Father   . Hypothyroidism Father   . Hypertension Mother     borderline  . Diabetes Mother   . Breast cancer Mother     Her Social History Is Significant For: Social History   Social History  . Marital Status: Married    Spouse Name: [name]  . Number of Children: 2  . Years of Education: 16   Occupational History  .      not  employed   Social History Main Topics  . Smoking status: Current Every Day Smoker  . Smokeless tobacco: Never Used     Comment: 4 packs weekly  . Alcohol Use: 0.0 oz/week    0 Standard drinks or equivalent per week     Comment: occas.,once every two months  . Drug Use: No  . Sexual Activity: Not Asked   Other Topics Concern  . None   Social History Narrative   Patient is right handed and resides with husband    Her Allergies Are:  Allergies  Allergen Reactions  . Seasonal Ic [Cholestatin]  Runny nose, itchy eyes  . Poison Ivy Extract [Extract Of Poison Ivy] Rash  :   Her Current Medications Are:  Outpatient Encounter Prescriptions as of 04/10/2015  Medication Sig  . aspirin 81 MG tablet Take 81 mg by mouth daily.  Marland Kitchen atorvastatin (LIPITOR) 20 MG tablet Take 20 mg by mouth at bedtime.  . Calcium-Vit D3-Phytosterols ([place]) [phone] MG-UNIT-MG TABS Take by mouth daily.  . cetirizine (ZYRTEC) 10 MG tablet Take 10 mg by mouth daily.  . diphenhydrAMINE (BENADRYL) 25 MG tablet Take 25 mg by mouth every 6 (six) hours as needed.  . donepezil (ARICEPT) 10 MG tablet Take 10 mg by mouth at bedtime.  Marland Kitchen escitalopram (LEXAPRO) 10 MG tablet Take 10 mg by mouth daily.  Marland Kitchen guaiFENesin (MUCINEX) 600 MG 12 hr tablet Take by mouth 2 (two) times daily.  . hydrochlorothiazide (HYDRODIURIL) 25 MG tablet Take 25 mg by mouth daily.  . Hydrocortisone (CORTIZONE-10 ECZEMA EX) Apply 1 % topically as needed.  . Memantine HCl ER (NAMENDA XR) 28 MG CP24 Take 28 mg by mouth daily.  . Multiple Vitamin (MULTIVITAMIN) capsule Take 1 capsule by mouth daily.  . Omega-3 Krill Oil 1000 MG CAPS Take by mouth daily.  Marland Kitchen telmisartan (MICARDIS) 80 MG tablet Take 80 mg by mouth daily.   No facility-administered encounter medications on file as of 04/10/2015.  :  Review of Systems:  Out of a complete 14 point review of systems, all are reviewed and negative with the exception of these  symptoms as listed below:   Review of Systems  Neurological:       Husband reports that patient has been taking her CPAP more at night.     Objective:  Neurologic Exam  Physical Exam Physical Examination:   Filed Vitals:   04/10/15 0840  BP: 126/64  Pulse: 78  Resp: 22   General Examination: The patient is a very pleasant 60 y.o. female in no acute distress. She appears well-developed and well-nourished and well groomed. She is minimally verbal. She has trouble following verbal commands and even some trouble mimicking.  HEENT: Normocephalic, atraumatic, pupils are equal, round and reactive to light and accommodation. Extraocular tracking is  impaired today. She has difficulty following commands. There is no lip, neck/head, jaw or voice tremor. Neck is supple with full range of passive and active motion. There are no carotid bruits on auscultation. Oropharynx exam reveals: mild mouth dryness, adequate dental hygiene and moderate airway crowding, due to larger tongue and thick soft palate and tonsils in place. Mallampati is class III. Tongue protrudes centrally and palate elevates symmetrically. Tonsils are 1+ in size.   Chest: Clear to auscultation without wheezing, rhonchi or crackles noted.  Heart: S1+S2+0, regular and normal without murmurs, rubs or gallops noted.   Abdomen: Soft, non-tender and non-distended with normal bowel sounds appreciated on auscultation.  Extremities: There is no pitting edema in the distal lower extremities bilaterally. Pedal pulses are intact.  Skin: Warm and dry without trophic changes noted. There are no varicose veins.  Musculoskeletal: exam reveals no obvious joint deformities, tenderness or joint swelling or erythema.   Neurologically:  Mental status: The patient is awake, alert and oriented to self only. Her memory is significantly impaired.  she is minimally verbal. Mood is normal and affect is normal.  Cranial nerves II - XII are as described  above under HEENT exam. In addition: shoulder shrug is normal with equal shoulder height noted. Motor exam: Normal bulk,  strength and tone is noted. There is no drift, tremor or rebound. Romberg is negative. Reflexes are 1-2+ throughout. Fine motor skills and coordination: difficult to test today as she has trouble following complex commands but is able to do simple commands and one-step actions.  Cerebellar testing: No dysmetria or intention tremor. There is no truncal or gait ataxia.  Sensory exam: intact to light touch in the upper and lower extremities.  Gait, station and balance: She stands easily. No veering to one side is noted. No leaning to one side is noted. Posture is age-appropriate and stance is narrow based. Gait shows normal stride length and normal pace. No problems turning are noted. She turns en bloc.  she is unable to do tandem walk              Assessment and Plan:   In summary, KENSEY LUEPKE is a very pleasant 60 year old female with an underlying medical history of hypertension, hyperlipidemia, smoking, advanced Mottram onset AD, anxiety, depression, ADD and chronic back pain, as well as severe obesity, who presents for follow-up consultation of her moderate obstructive sleep apnea, now on treatment with CPAP. We again reviewed her sleep study results from November 2015 and December 2015. She had some telltale improvement in her CPAP titration study which I pointed out again today. We also reviewed her most recent compliance data. She is generally speaking using her CPAP every night but not keeping and on. She has declined in her compliance in that regard. Some of this may have to do with the sleep problems her husband has had. When he goes to the recliner in another room, she has a tendency to get out of bed and lay down on the couch close to him. She has been using a nasal mask. We will make sure that this mask is a good fit for her. They had an insurance change since he retired  recently. Overall, I commended him for trying to make sure she uses her CPAP. Her memory loss has continued to progress. She has difficulty following verbal commands. He is again advised to help her quit smoking. Since his retirement they no longer get free cigarettes. That may actually help her quit as well. She does not smoke heavily. Her physical exam is otherwise stable. I would like to see her back in 6 months, sooner if needed. I answered all their questions today and the patient and her husband were in agreement. Most of my 20 minute visit today was spent in counseling and coordination of care, explaining test results, explaining compliance data and explaining the diagnosis of OSA, its prognosis and treatment options.

## 2015-04-10 NOTE — Patient Instructions (Signed)
Please continue using your CPAP regularly. While your insurance requires that you use CPAP at least 4 hours each night on 70% of the nights, I recommend, that you not skip any nights and use it throughout the night if you can. Getting used to CPAP and staying with the treatment long term does take time and patience and discipline. Untreated obstructive sleep apnea when it is moderate to severe can have an adverse impact on cardiovascular health and raise her risk for heart disease, arrhythmias, hypertension, congestive heart failure, stroke and diabetes. Untreated obstructive sleep apnea causes sleep disruption, nonrestorative sleep, and sleep deprivation. This can have an impact on your day to day functioning and cause daytime sleepiness and impairment of cognitive function, memory loss, mood disturbance, and problems focussing. Using CPAP regularly can improve these symptoms.  Keep up the good work! I will see you back in 6 months for sleep apnea check up.  

## 2015-04-11 ENCOUNTER — Telehealth: Payer: Self-pay

## 2015-04-11 NOTE — Telephone Encounter (Signed)
I spoke to husband, patient was to come in to get CPAP mask fitting. Shirlean Mylar is out sick today. Rescheduled fitting for Monday at 2pm.

## 2015-10-14 ENCOUNTER — Encounter: Payer: Self-pay | Admitting: Neurology

## 2015-10-14 ENCOUNTER — Ambulatory Visit (INDEPENDENT_AMBULATORY_CARE_PROVIDER_SITE_OTHER): Payer: PPO | Admitting: Neurology

## 2015-10-14 VITALS — BP 98/62 | HR 78 | Resp 20 | Ht 63.5 in | Wt 240.0 lb

## 2015-10-14 DIAGNOSIS — G300 Alzheimer's disease with early onset: Secondary | ICD-10-CM

## 2015-10-14 DIAGNOSIS — E669 Obesity, unspecified: Secondary | ICD-10-CM

## 2015-10-14 DIAGNOSIS — G4733 Obstructive sleep apnea (adult) (pediatric): Secondary | ICD-10-CM

## 2015-10-14 DIAGNOSIS — Z9989 Dependence on other enabling machines and devices: Principal | ICD-10-CM

## 2015-10-14 DIAGNOSIS — F0280 Dementia in other diseases classified elsewhere without behavioral disturbance: Secondary | ICD-10-CM | POA: Diagnosis not present

## 2015-10-14 NOTE — Progress Notes (Signed)
Subjective:    Patient ID: Barbara Gill is a 60 y.o. female.  HPI     Interim history:   Barbara Gill is a very pleasant 60 year old right-handed woman with an underlying medical history of hypertension, hyperlipidemia, smoking, advanced Issac onset AD, anxiety, depression, ADD, chronic back pain, and obesity, who presents for follow-up consultation of Barbara Gill obstructive sleep apnea, on treatment with CPAP. The patient is accompanied by Barbara Gill husband Gill today. I last saw Barbara Gill on 04/10/2015, at which time Barbara Gill husband reported that she would not always keep the CPAP on at night. Unfortunately, he had his own health issues and they were not typically sleeping the same back together.   Today, 10/14/2015: I reviewed Barbara Gill CPAP compliance data from 09/10/2015 through 10/09/2015 which is a total of 30 days, during which time she used Barbara Gill machine 20 days with percent used days greater than 4 hours at 30%, indicating poor compliance with an average usage for all nights of 2 hours and 21 minutes, residual AHI 2.4 per hour, leak at times high, 95th percentile of 24.9 L/m on a pressure of 10 cm with EPR of 2.   Today, 10/14/2015: Barbara Gill husband reports that she has good nights and bad nights with the CPAP. Tends to pull of CPAP mask in the middle of the night and he has to help Barbara Gill get the mask back on. He is trying to push oral water intake. She does not exercise very much and he has a hard time getting Barbara Gill to do any physical activity. He also has to take care of his mother who is 35 years old and lives in [place].   Previously:  I saw Barbara Gill on 10/10/2014, at which time she was doing fairly well. She was able to tolerate CPAP better. She had started treatment in January 2016. He reported that he recently had back surgery. She had gained a little bit of weight.   I reviewed Barbara Gill CPAP compliance data from 03/10/2015 through 04/08/2015 which is a total of 30 days during which time she used Barbara Gill CPAP 25 days with  percent used days greater than 4 hours at only 7%, indicating poor compliance with an average usage for all days of only 1 hour and 27 minutes, residual AHI 4 per hour, leak at times high with the 95th percentile at 41.3 L/m on a pressure of 10 cm with EPR of 2.    I saw Barbara Gill on 04/09/2014, at which time she was not fully compliant with CPAP treatment. She reported very little history herself. Barbara Gill husband was providing Barbara Gill history. He felt that she was sleeping better and resting more soundly. It was difficult for Barbara Gill to maintain the mask on throughout the night as she had a tendency to pull the mask off.    I reviewed Barbara Gill CPAP compliance data from 09/09/2014 through 10/08/2014 which is a total of 30 days during which time she used Barbara Gill machine 29 days with percent used days greater than 4 hours at 77%, indicating good compliance, improved from last time, with an average usage of 5 hours and 27 minutes, residual AHI low at 1.9 per hour, leak acceptable with the 95th percentile at 17 L/m on a pressure of 10 cm with EPR of 2.   I first met Barbara Gill on 10/11/2013 at the request of Barbara Gill primary care physician, at which time she reported snoring and daytime somnolence as well as recent weight gain. I invited Barbara Gill back for sleep study. She had  a baseline sleep study on 11/27/2013 followed by a CPAP titration study on 01/12/2014 and underwent over Barbara Gill test results with Barbara Gill and Barbara Gill husband in detail today. Barbara Gill baseline sleep study from 11/27/2013 showed a sleep efficiency of 74% with a latency to sleep of 43.5 minutes and wake after sleep onset of 86 minutes with severe sleep fragmentation noted. She had absence of slow-wave and REM sleep. She had mild PLMS with very mild arousals. She had mild to moderate snoring. Barbara Gill total AHI was 17.4 per hour. Average oxygen saturation was 88% only. Oxygen nadir was 78%. Based on Barbara Gill test results I asked Barbara Gill to come back for CPAP titration. She had this on 01/12/2014: Sleep efficiency was  90.2%, latency to sleep was 16.5 minutes, wake after sleep onset 28.5 minutes with moderate and mild sleep fragmentation noted. She had 23.1% of slow-wave sleep and 13.6% of REM sleep with a prolonged REM latency. CPAP was titrated from 5-10 cm of water pressure. Barbara Gill AHI was 0 per hour on a pressure of 10. She had minimal periodic leg movements.   I reviewed Barbara Gill compliance data from 03/07/2014 through 04/05/2014 which is a total of 30 days during which time she used Barbara Gill machine 29 days with percent used days greater than 4 hours of 50%, indicating suboptimal compliance with an average usage of 4 hours and 3 minutes on a pressure of 10 cm with EPR of 2. Residual AHI at 2.5 per hour, leak acceptable with the 95th percentile at 19.4 L/m.    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   Barbara Gill father had OSA and Barbara Gill brother has OSA. She has had some breathing pauses. She does not have a morning headache. She is a restless sleeper. She denies RLS symptoms and does not kick in Barbara Gill sleep. She talks in Barbara Gill sleep. She gets up at night 2 times per night, and sometimes goes to the living room and sleeps on the sofa. She goes to bed around 11 PM and falls asleep quickly. Barbara Gill wake time is around 7-9 AM. She feels sluggish in the morning and naps during the day. She seems to sleep more in the last 18 months.     She denies cataplexy, sleep paralysis, hypnagogic or hypnopompic hallucinations, or sleep attacks. She does not report any vivid dreams, nightmares, dream enactments, or parasomnias, such as sleep talking or sleep walking. The patient has not had a sleep study or a home sleep test.   Barbara Gill bedroom is usually dark and cool. There is no TV in the bedroom.     Barbara Gill Past Medical History Is Significant For: Past Medical History:  Diagnosis Date  . Alzheimer's disease   . High cholesterol   . Hyperlipemia   . Hypertension     Barbara Gill Past Surgical History Is Significant For: No past surgical history on  file.  Barbara Gill Family History Is Significant For: Family History  Problem Relation Age of Onset  . Cancer Father   . Hypothyroidism Father   . Hypertension Mother     borderline  . Diabetes Mother   . Breast cancer Mother     Barbara Gill Social History Is Significant For: Social History   Social History  . Marital status: Married    Spouse name: Onalee Hua  . Number of children: 2  . Years of education: 16   Occupational History  .      not employed   Social History Main Topics  .  Smoking status: Current Every Day Smoker  . Smokeless tobacco: Never Used     Comment: 1 pack weekly   . Alcohol use 0.0 oz/week     Comment: occas.,once every two months  . Drug use: No  . Sexual activity: Not Asked   Other Topics Concern  . None   Social History Narrative   Patient is right handed and resides with husband    Barbara Gill Allergies Are:  Allergies  Allergen Reactions  . Seasonal Ic [Cholestatin]     Runny nose, itchy eyes  . Poison Ivy Extract [Poison Ivy Extract] Rash  :   Barbara Gill Current Medications Are:  Outpatient Encounter Prescriptions as of 10/14/2015  Medication Sig  . aspirin 81 MG tablet Take 81 mg by mouth daily.  Marland Kitchen atorvastatin (LIPITOR) 20 MG tablet Take 20 mg by mouth at bedtime.  . Calcium-Vit D3-Phytosterols (CITRACAL PLUS HEART HEALTH) [phone] MG-UNIT-MG TABS Take by mouth daily.  . cetirizine (ZYRTEC) 10 MG tablet Take 10 mg by mouth daily.  . diphenhydrAMINE (BENADRYL) 25 MG tablet Take 25 mg by mouth every 6 (six) hours as needed.  . donepezil (ARICEPT) 10 MG tablet Take 10 mg by mouth at bedtime.  Marland Kitchen escitalopram (LEXAPRO) 10 MG tablet Take 10 mg by mouth daily.  Marland Kitchen guaiFENesin (MUCINEX) 600 MG 12 hr tablet Take by mouth 2 (two) times daily.  . hydrochlorothiazide (HYDRODIURIL) 25 MG tablet Take 25 mg by mouth daily.  . Hydrocortisone (CORTIZONE-10 ECZEMA EX) Apply 1 % topically as needed.  . Memantine HCl ER (NAMENDA XR) 28 MG CP24 Take 28 mg by mouth daily.  .  Multiple Vitamin (MULTIVITAMIN) capsule Take 1 capsule by mouth daily.  . Omega-3 Krill Oil 1000 MG CAPS Take by mouth daily.  Marland Kitchen telmisartan (MICARDIS) 80 MG tablet Take 80 mg by mouth daily.   No facility-administered encounter medications on file as of 10/14/2015.   :  Review of Systems:  Out of a complete 14 point review of systems, all are reviewed and negative with the exception of these symptoms as listed below: Review of Systems  Neurological:       Husband reports that patient has good nights and bad nights with CPAP. Husband has to put the CPAP back on Barbara Gill.     Objective:  Neurologic Exam  Physical Exam Physical Examination:   Vitals:   10/14/15 1601  BP: 98/62  Pulse: 78  Resp: 20   General Examination: The patient is a very pleasant 60 y.o. female in no acute distress. She appears well-developed and well-nourished and well groomed. She is minimally verbal. She has trouble following verbal commands, but can follow simple commands by mimicking.  HEENT: Normocephalic, atraumatic, pupils are equal, round and reactive to light and accommodation. Extraocular tracking is  impaired today. She has difficulty following commands. There is no lip, neck/head, jaw or voice tremor. Neck is supple with full range of passive and active motion. There are no carotid bruits on auscultation. Oropharynx exam reveals: mild to moderate mouth dryness, adequate dental hygiene and moderate airway crowding, due to larger tongue and thick soft palate and tonsils in place. Mallampati is class III. Tongue protrudes centrally and palate elevates symmetrically. Tonsils are 1+ in size.   Chest: Clear to auscultation without wheezing, rhonchi or crackles noted.  Heart: S1+S2+0, regular and normal without murmurs, rubs or gallops noted.   Abdomen: Soft, non-tender and non-distended with normal bowel sounds appreciated on auscultation.  Extremities: There is no pitting edema  in the distal lower extremities  bilaterally. Pedal pulses are intact.  Skin: Warm and dry without trophic changes noted. There are no varicose veins.  Musculoskeletal: exam reveals no obvious joint deformities, tenderness or joint swelling or erythema.   Neurologically:  Mental status: The patient is awake, alert and oriented to self only. Barbara Gill memory is significantly impaired. She is minimally verbal. Mood is normal and affect is normal.  Cranial nerves II - XII are as described above under HEENT exam. In addition: shoulder shrug is normal with equal shoulder height noted.  Motor exam: Normal bulk, strength and tone is noted. There is no drift, tremor or rebound. Romberg is negative. Reflexes are 1-2+ throughout. Fine motor skills and coordination: difficult to test today as she has trouble following complex commands but is able to do simple commands and one-step actions.  Cerebellar testing: No dysmetria or intention tremor. There is no truncal or gait ataxia.  Sensory exam: intact to light touch in the upper and lower extremities.  Gait, station and balance: She stands easily. No veering to one side is noted. No leaning to one side is noted. Posture is age-appropriate and stance is narrow based. Gait shows normal stride length and normal pace. No problems turning are noted. She is unable to do tandem walk              Assessment and Plan:   In summary, Barbara Gill is a very pleasant 60 year old female with an underlying medical history of hypertension, hyperlipidemia, smoking, advanced Henkels onset AD, anxiety, depression, ADD and chronic back pain, as well as severe obesity, who presents for follow-up consultation of Barbara Gill moderate obstructive sleep apnea, on treatment with CPAP with off and on compliance. We Gill reviewed Barbara Gill sleep study results from November 2015 and December 2015 briefly today. She had some telltale improvement of Barbara Gill sleep apnea post CPAP. She has been using CPAP compliance is a problem because of Barbara Gill  advanced dementia. Barbara Gill exam is stable. I think we have reached a plateau in Barbara Gill compliance. I asked Barbara Gill husband to try as best as he can to remind Barbara Gill to put the CPAP mask back on in the middle of the night. She needs new supplies and I renewed Barbara Gill order in that regard. I suggested a one-year checkup, sooner as needed. I commended Barbara Gill husband for trying to make sure she uses Barbara Gill CPAP and for pushing oral water intake. She has advanced dementia of Wynns onset, on dual therapy.  I answered all his questions today and the patient and Barbara Gill husband were in agreement. Most of my 25 minute visit today was spent in counseling and coordination of care, explaining test results, explaining compliance data and explaining the diagnosis of OSA, its prognosis and treatment options.

## 2015-10-14 NOTE — Patient Instructions (Signed)
Please continue using your CPAP regularly. While your insurance requires that you use CPAP at least 4 hours each night on 70% of the nights, I recommend, that you not skip any nights and use it throughout the night if you can. Getting used to CPAP and staying with the treatment long term does take time and patience and discipline. Untreated obstructive sleep apnea when it is moderate to severe can have an adverse impact on cardiovascular health and raise her risk for heart disease, arrhythmias, hypertension, congestive heart failure, stroke and diabetes. Untreated obstructive sleep apnea causes sleep disruption, nonrestorative sleep, and sleep deprivation. This can have an impact on your day to day functioning and cause daytime sleepiness and impairment of cognitive function, memory loss, mood disturbance, and problems focussing. Using CPAP regularly can improve these symptoms.  I will see you back in 12 months for sleep apnea check up.

## 2015-10-21 DIAGNOSIS — G4733 Obstructive sleep apnea (adult) (pediatric): Secondary | ICD-10-CM | POA: Diagnosis not present

## 2015-10-22 DIAGNOSIS — I10 Essential (primary) hypertension: Secondary | ICD-10-CM | POA: Diagnosis not present

## 2015-10-22 DIAGNOSIS — G4733 Obstructive sleep apnea (adult) (pediatric): Secondary | ICD-10-CM | POA: Diagnosis not present

## 2015-10-22 DIAGNOSIS — E784 Other hyperlipidemia: Secondary | ICD-10-CM | POA: Diagnosis not present

## 2015-10-22 DIAGNOSIS — E119 Type 2 diabetes mellitus without complications: Secondary | ICD-10-CM | POA: Diagnosis not present

## 2015-10-22 DIAGNOSIS — R7301 Impaired fasting glucose: Secondary | ICD-10-CM | POA: Diagnosis not present

## 2015-10-22 DIAGNOSIS — R946 Abnormal results of thyroid function studies: Secondary | ICD-10-CM | POA: Diagnosis not present

## 2015-10-22 DIAGNOSIS — G308 Other Alzheimer's disease: Secondary | ICD-10-CM | POA: Diagnosis not present

## 2015-10-22 DIAGNOSIS — E1165 Type 2 diabetes mellitus with hyperglycemia: Secondary | ICD-10-CM | POA: Diagnosis not present

## 2015-10-22 DIAGNOSIS — Z1389 Encounter for screening for other disorder: Secondary | ICD-10-CM | POA: Diagnosis not present

## 2015-10-22 DIAGNOSIS — Z6841 Body Mass Index (BMI) 40.0 and over, adult: Secondary | ICD-10-CM | POA: Diagnosis not present

## 2015-11-01 ENCOUNTER — Other Ambulatory Visit: Payer: Self-pay

## 2015-11-01 NOTE — Patient Outreach (Signed)
[place] [facility]) Care Management  11/01/2015  ANIRA BLOODSAW 04/21/1955 [id number]   Telephonic Screening   Referral Date:  10/29/15 Source:  [facility]  Issue:  Severe Dementia. / advanced [place] onset Alzheimer's Disease.   Contact:  Husband [name] PCP:  Dr. Hunt Oris A1C 8.500 % 10/22/2015  Outreach call to patient's caregiver/husband/[name] Smedberg.  [phone] home [phone] cell ([phone]) Contact not reached.  RN CM left HIPAA compliant voice message with name and number.  RN CM scheduled for next contact call within one week.    [name], BSN, RN, [place] [facility] Care Management Coordinator [phone] Direct [phone] Cell [phone] Office [phone] Fax [email or web address]

## 2015-11-07 ENCOUNTER — Other Ambulatory Visit: Payer: Self-pay

## 2015-11-07 DIAGNOSIS — E1165 Type 2 diabetes mellitus with hyperglycemia: Secondary | ICD-10-CM

## 2015-11-07 DIAGNOSIS — G4733 Obstructive sleep apnea (adult) (pediatric): Secondary | ICD-10-CM

## 2015-11-07 DIAGNOSIS — G300 Alzheimer's disease with early onset: Secondary | ICD-10-CM

## 2015-11-07 DIAGNOSIS — F329 Major depressive disorder, single episode, unspecified: Secondary | ICD-10-CM

## 2015-11-07 DIAGNOSIS — F411 Generalized anxiety disorder: Secondary | ICD-10-CM

## 2015-11-07 DIAGNOSIS — F32A Depression, unspecified: Secondary | ICD-10-CM

## 2015-11-07 DIAGNOSIS — F17210 Nicotine dependence, cigarettes, uncomplicated: Secondary | ICD-10-CM

## 2015-11-07 DIAGNOSIS — F988 Other specified behavioral and emotional disorders with onset usually occurring in childhood and adolescence: Secondary | ICD-10-CM

## 2015-11-07 DIAGNOSIS — F0280 Dementia in other diseases classified elsewhere without behavioral disturbance: Secondary | ICD-10-CM

## 2015-11-07 NOTE — Patient Outreach (Signed)
[place] [facility]) Care Management  11/07/2015  Barbara Gill [birth year] [id number]   Telephonic Screening   Referral Date:  10/29/15 Source:  [facility]  Issue:  Severe Dementia. / advanced Gift onset Alzheimer's Disease.   Contact:  Husband [name] PCP:  Dr. Hunt Oris A1C 8.500 % 10/22/2015  Outreach call #2 to patient's caregiver/husband/[name] Fortunato.  [phone] home [phone] cell  Contact not reached.  RN CM left HIPAA compliant voice message with name and number and requested return call.   RN CM scheduled for next contact call within one week.  [name], BSN, RN, [place] [facility] Care Management Coordinator [phone] Direct [phone] Cell [phone] Office [phone] Fax [email or web address]

## 2015-11-07 NOTE — Patient Outreach (Addendum)
[facility]) Care Management  11/07/2015  Barbara Gill 1955-05-31 [id number]   Telephonic Screening and Initial Assessment    Referral Date:10/29/2015 Referral Source: Dr. Gwyndolyn Saxon [facility] Issue: Severe Dementia Insurance: [employer] Medicare Program:   Providers: Primary MD: Dr. Lendon Ka. Brigitte Pulse -  last appt: 10/30/2015   next appt:  Neurologist:  Past services with [facility] but is no longer seeing.  Neurologist:  Dr. Star Age, [facility]  (OSA & CPAP services)  HH: None  Psycho/Social: Patient lives in the home with her husband, caregiver, Kaarina Folkert [phone] cell.  Husband is also trying to manage his 35 yo mothers care in [place], [place].  Husband retired last year and is a Quarry manager.  H/o remarried with 2 sons, 1 granddaughter and 1 grandson.  Husband reports no support provided by family and he is the sole caregiver.  Husband states the care of the house has deteriorated due to the time it takes to manage his wife's care.  Mobility: ambulates with no assistive devices Falls: none Pain: none Depression: none Transportation: Husband Caregiver: Husband  Emergency Contact: Husband Advance Directive: Yes; Lonna Cobb Consent:  Per Dr. Brigitte Pulse:  Contact patients PCG/husband/[name] Trent.  Husband consents to [facility] services.   LTC Plan:  Husband states he is not ready to place patient in a Memory Care Unit but has been thinking about options for LTC needs.  Husband has not yet researched or visited any facilities. Husband confirms NO LTC insurance.   Husband is open to resource materials regarding options of Memory Care Units in [facility] and / or in-home service options to assist with care and increasing level of care needs.  DME: CBG meter and supplies, CPAP, eyeglasses   Co-morbidities:   Alzheimer's / Severe Dementia, DM type 2 with hyperglycemia, Hyperlipidemia,  Nicotine addiction, anxiety, depression, ADD, OSA with CPAP, Morbid Obesity Admissions: 0 ER visits: 0  DM Weight:  239 10/29/2015 Height 63 in 10/29/2015 BP:  96/70  10/29/2015 BMI:  42.34  10/29/2015 A1C 8.500 % 10/22/2015 CBG: 255 10/29/2015 H/o Newly diagnosed diabetes with significant increase in A1c with dietary indiscretion. H/o drinking lots of cokes and eating more desserts.  No weight loss, urinary frequency, polydipsia. Treatment started with metformin to hep prevent significant hyperglycemia.  Primary MD hopeful to improve with diet modification to avoid additional medication management due to progressive dementia.  Husband reports diarrhea episodes while in the shower since starting Metformin but have resolved.   Alzheimer's  / Severe Dementia Husband reports memory is deteriorating  increasing memory problems and patient has difficulty completing sentences.  States patient has to have 24/7 supervised care and is unable to perform ADLs or IADLs without supervision and assistance.  Husband has noted rapid changes in memory over the past several months.    Medications:  Patient taking more than / less than 15 medications  Co-pay cost issues: none  Prevnar (PCV13) Va N/D Pneumovax (PPSV2 05/22/2008 Flu Vaccine 10/22/2015 tDAP Vaccine N/D Medication Reconciliation completed with husband 11/08/15  Encounter Medications:  Outpatient Encounter Prescriptions as of 11/07/2015  Medication Sig Note  . aspirin 81 MG tablet Take 81 mg by mouth daily.   Marland Kitchen atorvastatin (LIPITOR) 20 MG tablet Take 20 mg by mouth at bedtime.   . metFORMIN (GLUCOPHAGE) 500 MG tablet Take 500 mg by mouth 2 (two) times daily with a meal.   . telmisartan (MICARDIS) 80 MG tablet Take 80  mg by mouth daily.   . Calcium-Vit D3-Phytosterols ([place]) [phone] MG-UNIT-MG TABS Take by mouth daily.   . cetirizine (ZYRTEC) 10 MG tablet Take 10 mg by mouth daily.   . diphenhydrAMINE (BENADRYL) 25 MG  tablet Take 25 mg by mouth every 6 (six) hours as needed.   . donepezil (ARICEPT) 10 MG tablet Take 10 mg by mouth at bedtime.   Marland Kitchen escitalopram (LEXAPRO) 10 MG tablet Take 10 mg by mouth daily.   Marland Kitchen guaiFENesin (MUCINEX) 600 MG 12 hr tablet Take by mouth 2 (two) times daily.   . hydrochlorothiazide (HYDRODIURIL) 25 MG tablet Take 25 mg by mouth daily. 11/08/2015: Not Taking:  Discontinued 10/29/15 by Dr. Brigitte Pulse due to relative hypotension.    . Hydrocortisone (CORTIZONE-10 ECZEMA EX) Apply 1 % topically as needed.   . Memantine HCl ER (NAMENDA XR) 28 MG CP24 Take 28 mg by mouth daily.   . Multiple Vitamin (MULTIVITAMIN) capsule Take 1 capsule by mouth daily.   . Omega-3 Krill Oil 1000 MG CAPS Take by mouth daily.    No facility-administered encounter medications on file as of 11/07/2015.    Fall Risk  11/08/2015  Falls in the past year? No  Risk for fall due to : Impaired vision;Medication side effect;Mental status change    Preventives: Hearing: no issues / never screened Eyes: Dr.   Due for an exam and wears glasses.  Dentist: yearly Podiatrist: none  Mammogram: 06/13/2013 PAP: 09/05/2012 Bone Density: unknown Colonoscopy:  11/06/2008 Smoker:  Current everyday smoker<1/2 pack per day    Plan:  Referral Date:  103/2017 Screening and Initial Assessment: 11/07/2015 Telephonic RN CM date:  11/07/2015 Program:  DM  11/07/2015  DM:  Newly diagnosed diabetes with significant increase in A1c 8.5 with dietary indiscretion -RN CM will provide education on DM education and management.  -RN CM will provide Diabetes diet education.  -RN CM will discuss health risk associated to unamanged diabetes Nature conservation officer (mailed 11/08/2015) -Diabetes Diet - Type 2 -Diabetes: Why Get Your A1C Checked? -Diabetes: Controlling Blood Sugar -Diabetes: How To Check Your Blood Sugar -Diabetes - When You Are Sick -Blood Sugar, Weight and  Blood Pressure tracking forms  Alzheimer with  progressive memory decline -RN CM will discuss health and  safety concerns relating to smoking and memory issues.  Emmi Educational Materials (mailed 11/08/2015) -Alzheimer's - Resources   Advance Directives: -RN CM requested copy of Advance Directive to place on file in the [place]. -RN CM encouraged to provide copy of Advance Directive to Dr. Brigitte Pulse  [facility] Social Work Referral H/o Alzheimer's disease, severe with progression over the past several months. -Emergency planning/management officer for [employer]; [facility] for [employer];  and / or LTC resources for in-home services.     RN CM advised in next [facility] scheduled contact call within next 30 days for monthly assessment and / or care coordination services as needed.  RN CM advised to please notify MD of any changes in condition prior to scheduled appt's.   RN CM provided contact name and # [phone] or main office # [phone] and 24-hour nurse line # [phone].  RN CM confirmed patient is aware of 911 services for urgent emergency needs.  RN CM notified [place] Management Assistant: agreed to services/case opened RN CM sent successful outreach letter and  [facility] Introductory package. RN CM sent Physician Enrollment/Barriers Letter and Initial Assessment to Primary MD  [facility] CM Care Plan Problem One  Flowsheet Row Most Recent Value  Care Plan Problem One  Knowledge deficit relating to new diagnosis of DM 2.   Role Documenting the Problem One  Care Management Telephonic Coordinator  Care Plan for Problem One  Active  THN Long Term Goal (31-90 days)  PCG's will engage in DM education and demonstrate improved knowledge of home self management program over the next 31-90 days.   THN Long Term Goal Start Date  11/08/15  Interventions for Problem One Long Term Goal  RN CM will provide verbal and written eduation over the next 31-90 days.   THN CM Short Term Goal #1 (0-30 days)  PCG will monitor home BS readings over the next  30 days.   THN CM Short Term Goal #1 Start Date  11/08/15  Interventions for Short Term Goal #1  RN CM will provide education on BS management interventions over the next 30 days.   THN CM Short Term Goal #2 (0-30 days)  PCG will improve knowledge of DM diet over the next 30 days.   THN CM Short Term Goal #2 Start Date  11/08/15  Interventions for Short Term Goal #2  RN CM will provide education on DM diet mangement over the next 30 days.   THN CM Short Term Goal #3 (0-30 days)  PCG will review a couple of [employer] to be discussed over the next 30 days.   THN CM Short Term Goal #3 Start Date  11/08/15  Interventions for Short Tern Goal #3  RN CM will send Emmi Education materials to prepare for future calls and education over the next 30 days.     [facility] CM Care Plan Problem Two   Flowsheet Row Most Recent Value  Care Plan Problem Two  Community Resources   Role Documenting the Problem Two  Care Management Telephonic Coordinator  Care Plan for Problem Two  Active  THN CM Short Term Goal #1 (0-30 days)  PCG will engage with [facility] SW Referral over the next 30 days.   THN CM Short Term Goal #1 Start Date  11/08/15  Interventions for Short Term Goal #2   RN CM will send SW Referral for Community LTC resources over the next 30 days.     [facility] CM Care Plan Problem Three   Flowsheet Row Most Recent Value  Care Plan Problem Three  Knowledge deficit associated to patietn with Alzheimers and smoking.   Role Documenting the Problem Three  Care Management Telephonic Coordinator  Care Plan for Problem Three  Active  THN CM Short Term Goal #1 (0-30 days)  PCG will monitor safety and remain with patient while smoking over the next 30 days.   THN CM Short Term Goal #1 Start Date  11/08/15  Interventions for Short Term Goal #1  RN CM to provide ongoing safety monitoring of Alzheimer's patient and smoking over the next 30 days.      [name], BSN, RN, [place] Management Care Management Coordinator [phone] Direct [phone] Cell [phone] Office [phone] Fax [email or web address]

## 2015-11-08 DIAGNOSIS — E119 Type 2 diabetes mellitus without complications: Secondary | ICD-10-CM | POA: Insufficient documentation

## 2015-11-08 DIAGNOSIS — F411 Generalized anxiety disorder: Secondary | ICD-10-CM | POA: Insufficient documentation

## 2015-11-08 DIAGNOSIS — F329 Major depressive disorder, single episode, unspecified: Secondary | ICD-10-CM | POA: Insufficient documentation

## 2015-11-08 DIAGNOSIS — E785 Hyperlipidemia, unspecified: Secondary | ICD-10-CM | POA: Insufficient documentation

## 2015-11-08 DIAGNOSIS — G4733 Obstructive sleep apnea (adult) (pediatric): Secondary | ICD-10-CM | POA: Insufficient documentation

## 2015-11-08 DIAGNOSIS — F32A Depression, unspecified: Secondary | ICD-10-CM | POA: Insufficient documentation

## 2015-11-08 DIAGNOSIS — F17200 Nicotine dependence, unspecified, uncomplicated: Secondary | ICD-10-CM | POA: Insufficient documentation

## 2015-11-08 DIAGNOSIS — F988 Other specified behavioral and emotional disorders with onset usually occurring in childhood and adolescence: Secondary | ICD-10-CM | POA: Insufficient documentation

## 2015-11-08 DIAGNOSIS — G309 Alzheimer's disease, unspecified: Secondary | ICD-10-CM

## 2015-11-08 DIAGNOSIS — F0280 Dementia in other diseases classified elsewhere without behavioral disturbance: Secondary | ICD-10-CM | POA: Insufficient documentation

## 2015-11-08 NOTE — Addendum Note (Signed)
Addended by: Standley Brooking on: 11/08/2015 12:17 PM   Modules accepted: Orders

## 2015-11-11 ENCOUNTER — Other Ambulatory Visit: Payer: Self-pay | Admitting: Licensed Clinical Social Worker

## 2015-11-11 ENCOUNTER — Ambulatory Visit: Payer: Self-pay

## 2015-11-11 NOTE — Patient Outreach (Signed)
[place] [facility]) Care Management  11/11/2015  Barbara Gill 05/27/1955 [id number]   Assessment-CSW completed initial outreach attempt today after receiving new referral on 11/08/15. CSW unable to reach patient successfully. CSW left a HIPPA compliant voice message encouraging patient to return call once available.  Plan-CSW will await return call or complete an additional outreach if needed.  [name], BSW, MSW, [email or web address] Phone: [phone] Fax: [phone]

## 2015-11-12 ENCOUNTER — Other Ambulatory Visit: Payer: Self-pay | Admitting: Licensed Clinical Social Worker

## 2015-11-12 NOTE — Patient Outreach (Signed)
[facility]) Care Management  11/12/2015  NOAMI AMOR 1955/03/14 [id number]   Assessment-CSW completed second outreach attempt today. CSW unable to reach patient successfully. CSW left a HIPPA compliant voice message encouraging patient to return call once available.  Plan-CSW will await return call or complete an additional outreach if needed.  [name], BSW, MSW, [email or web address] Phone: [phone] Fax: [phone]

## 2015-11-14 ENCOUNTER — Other Ambulatory Visit: Payer: Self-pay | Admitting: Licensed Clinical Social Worker

## 2015-11-15 ENCOUNTER — Encounter: Payer: Self-pay | Admitting: Licensed Clinical Social Worker

## 2015-11-15 NOTE — Patient Outreach (Signed)
[facility]) Care Management  11/15/2015  ANASHIA RILL 1955-01-30 [id number]   Assessment- CSW received voice message from spouse requesting return call. CSW completed call back to family. Spouse answered and provided HIPPA verifications. He is agreeable to schedule a home visit for 11/19/15 at 1:00pm.  Plan-CSW will send involvement letter to PCP. CSW will complete home visit next week.  [name], BSW, MSW, [email or web address] Phone: [phone] Fax: [phone]

## 2015-11-19 ENCOUNTER — Other Ambulatory Visit: Payer: Self-pay | Admitting: Licensed Clinical Social Worker

## 2015-11-19 NOTE — Patient Outreach (Signed)
[place] [facility]) Care Management  [facility] Social Work  11/19/2015  Barbara Gill [birth year] [id number]  Encounter Medications:  Outpatient Encounter Prescriptions as of 11/19/2015  Medication Sig Note  . aspirin 81 MG tablet Take 81 mg by mouth daily.   Marland Kitchen atorvastatin (LIPITOR) 20 MG tablet Take 20 mg by mouth at bedtime.   . Calcium-Vit D3-Phytosterols ([place]) [phone] MG-UNIT-MG TABS Take by mouth daily.   . cetirizine (ZYRTEC) 10 MG tablet Take 10 mg by mouth daily.   . diphenhydrAMINE (BENADRYL) 25 MG tablet Take 25 mg by mouth every 6 (six) hours as needed.   . donepezil (ARICEPT) 10 MG tablet Take 10 mg by mouth at bedtime.   Marland Kitchen escitalopram (LEXAPRO) 10 MG tablet Take 10 mg by mouth daily.   Marland Kitchen guaiFENesin (MUCINEX) 600 MG 12 hr tablet Take by mouth 2 (two) times daily.   . hydrochlorothiazide (HYDRODIURIL) 25 MG tablet Take 25 mg by mouth daily. 11/08/2015: Not Taking:  Discontinued 10/29/15 by Dr. Brigitte Pulse due to relative hypotension.    . Hydrocortisone (CORTIZONE-10 ECZEMA EX) Apply 1 % topically as needed.   . Memantine HCl ER (NAMENDA XR) 28 MG CP24 Take 28 mg by mouth daily.   . metFORMIN (GLUCOPHAGE) 500 MG tablet Take 500 mg by mouth 2 (two) times daily with a meal.   . Multiple Vitamin (MULTIVITAMIN) capsule Take 1 capsule by mouth daily.   . Omega-3 Krill Oil 1000 MG CAPS Take by mouth daily.   Marland Kitchen telmisartan (MICARDIS) 80 MG tablet Take 80 mg by mouth daily.    No facility-administered encounter medications on file as of 11/19/2015.     Functional Status:  In your present state of health, do you have any difficulty performing the following activities: 11/19/2015 11/08/2015  Hearing? N N  Vision? N N  Difficulty concentrating or making decisions? Tempie Donning  Walking or climbing stairs? N N  Dressing or bathing? Y Y  Doing errands, shopping? Tempie Donning  Preparing Food and eating ? - Y  Using the Toilet? - Y  In the past six months, have you  accidently leaked urine? - N  Do you have problems with loss of bowel control? - Y  Managing your Medications? - Y  Managing your Finances? - Y  Housekeeping or managing your Housekeeping? - Y  Some recent data might be hidden    Fall/Depression Screening:  PHQ 2/9 Scores 11/19/2015 11/08/2015  PHQ - 2 Score 0 0    Assessment: CSW completed home visit with patient and spouse on 11/19/15. Patient has severe dementia and is in need of personal care resources. Family is not agreeable to LTC placement at this time and would like to gain resources to help keep patient at home as long as possible. Patient and spouse have been married for 6 years. Patient has been suffering from dementia for almost 6 years as well. Patient has difficulty articulating words and cannot state her name, DOB or address. Patient often repeats exactly what is being said to her. Family own home and have several assets including a truck, $150,000 in retirement for patient, another house in patient's name, owns their current residence and spouse's savings. Patient would not be eligible for Full Adult Medicaid even though she receives $900 per month due to these assets. Spouses income is $1,500 per month and he retired last year. Spouse is the primary caregiver for patient and states that he also provides care when he can to  his 22 year old mother. Spouse reports that he has to assist patient with showering, preparing meals and putting on her clothes. Patient does not have any medical equipment that she uses other than eyeglasses, CBG Meter and CPAP. Spouse reports that he can leave patient at home in order to run errands for up to two hours. CSW provided education on Mobile Meals but family denied this service. Family deny no concerns with transportation. Spouse requested information on legal guardianship and education was provided. Patient has both an HCPOA and Living Will but copy is not in chart.   CSW provided family with a packet  of resources that included: Development worker, community, personal care resources, [place], PACE, ACE, [employer] and LTC planning tips. CSW reviewed each resource family. Family understand that private pay aides average cost in [place] is $18.00. Family state that they are more interested in ACE, [facility], [facility] [employer] and [employer]. Spouse reports he has went to PACE to discuss enrollment but that they could not afford their monthly cost. Patient and spouse are interested in ACE because they would not have to change primary care providers and could afford to go there a few days out of the week. CSW provided location of newest ACE and family have agreed to contact them within 30 days. CSW educated family on In Home Aide Services and [facility] and family are agreeable to BOTH referrals. [facility] wait list is 1 year and would only be $4.00 per hour for patient. [facility] [place] will be a free service for family but has a wait list of 2 years and a requirement to serve clients ages 29 and over. Family is also interested in [facility] and [facility] but CSW informed them that patient will not be able to be left alone there. CSW also educated family on LTC planning. Family appreciative of all resource information.   CSW contacted [facility] and successfully put patient on the wait list for CNA.  CSW contacted [employer] program and completed referral. Patient is eligible for Level 2 program which is 8 hours per week.   Plan: CSW will route entire assessment to PCP and will follow up with family within 30 days.  [facility] CM Care Plan Problem One   Flowsheet Row Most Recent Value  Care Plan Problem One  [place] deficit related to personal care resources  Role Documenting the Problem One  Clinical Social Worker  Care Plan for Problem One  Active  THN Long Term Goal (31-90 days)  Family will be educated on appropriate personal care resources within 90 days as evidenced by need  for more support in the home  [facility] Long Term Goal Start Date  11/15/15  Interventions for Problem One Long Term Goal  CSW will complete home visit and will provide handouts and education on community resources in [place] to assist them with care in the home. CSW will re educate them as needed and make referrals if needed as well.  THN CM Short Term Goal #1 (0-30 days)  Family will contact ACE program within 30 days in order to gather further information on program due to patient needed more support and socialization  [facility] CM Short Term Goal #1 Start Date  11/19/15  Interventions for Short Term Goal #1  CSW completed home visit and assisted family with providing resource education on ACE, their average price, what their program includes, how often they can go, etc. CSW  will follow up with family within 30 days.       [name], BSW, MSW, [email or web address] Phone: [phone] Fax: [phone]

## 2015-12-06 ENCOUNTER — Other Ambulatory Visit: Payer: Self-pay | Admitting: Licensed Clinical Social Worker

## 2015-12-06 ENCOUNTER — Other Ambulatory Visit: Payer: Self-pay

## 2015-12-06 NOTE — Patient Outreach (Signed)
[place] [facility]) Care Management  12/06/2015  Barbara Gill [birth year] [id number]   Telephonic Monthly Assessment  Outreach call #1.  Patient/Caregiver/Husband not reached.  RN CM left HIPAA complaint voice message with name and number.  RN CM scheduled for next contact call within one week.   [name], BSN, RN, [place] Care Management Care Management Coordinator [phone] Direct [phone] Cell [phone] Office [phone] Fax [email or web address]

## 2015-12-06 NOTE — Patient Outreach (Signed)
[facility]) Care Management  12/06/2015  ETOSHA HRBEK 1955-05-17 [id number]  Assessment-CSW completed outreach attempt today. CSW unable to reach patient or patient's spouse successfully. CSW left a HIPPA compliant voice message encouraging spouse to return call once available.  Plan-CSW will await return call or complete an additional outreach if needed within two weeks.  [name], BSW, MSW, [email or web address] Phone: [phone] Fax: [phone]

## 2015-12-10 ENCOUNTER — Other Ambulatory Visit: Payer: Self-pay | Admitting: Licensed Clinical Social Worker

## 2015-12-10 ENCOUNTER — Ambulatory Visit: Payer: Self-pay

## 2015-12-10 NOTE — Patient Outreach (Signed)
[place] [facility]) Care Management  12/10/2015  Barbara Gill February 03, 1955 [id number]   Assessment-CSW completed second outreach attempt today. CSW unable to reach patient successfully. CSW left a HIPPA compliant voice message encouraging patient to return call once available.  Plan-CSW will await return call or complete an additional outreach within two to three weeks.  [name], BSW, MSW, [email or web address] Phone: [phone] Fax: [phone]

## 2015-12-11 ENCOUNTER — Other Ambulatory Visit: Payer: Self-pay

## 2015-12-11 NOTE — Patient Outreach (Signed)
[facility]) Care Management  12/11/2015  Barbara Gill Oct 19, 1955 [id number]   Telephonic Monthly Assessment   Referral Date:10/29/2015 Referral Source: Dr. Gwyndolyn Saxon [facility] Issue: Severe Dementia Insurance: Health Team Advantage Medicare Program:  DM  11/07/2015  Providers: Primary MD: Dr. Lendon Ka. Brigitte Pulse -  last appt: 10/30/2015  Neurologist:  Past services with [facility] but is no longer seeing.  Neurologist:  Dr. Star Age, [facility]  (OSA & CPAP services)  HH: None  Psycho/Social: Patient lives in the home with her husband, caregiver, Danamarie Minami [phone] cell.  Husband is also trying to manage his 89 yo mothers care in [place], [place].  Husband retired last year and is a Quarry manager.  H/o remarried with 2 sons, 1 granddaughter and 1 grandson.  Husband reports no support provided by family and he is the sole caregiver.   Mobility: ambulates with no assistive devices Falls: none Pain: none Depression: none Transportation: Husband Caregiver: Husband  Emergency Contact: Husband Advance Directive: Yes; HCPOA, Husband, [name] Consent:  Per Dr. Brigitte Pulse:  Contact patients PCG/husband/[name] Curfman.  Husband consents to [facility] services.   LTC Plan:  H/o Husband is not ready to place patient in a Memory Care Unit but has been thinking about options for LTC needs.  Husband has not yet researched or visited any facilities. Patient has NO LTC insurance.   Husband is open to resource materials regarding options of Memory Care Units in [facility] and / or in-home service options to assist with care and increasing level of care needs.  Resources provided on 11/07/2015 but husband states he has not had a chance to follow-up this month.  DME: CBG meter and supplies, CPAP, eyeglasses   Co-morbidities:   Alzheimer's / Severe Dementia, DM type 2 with hyperglycemia, Hyperlipidemia, Nicotine addiction,  anxiety, depression, ADD, OSA with CPAP, Morbid Obesity Admissions: 0 ER visits: 0  DM Weight:  239 10/29/2015 Height 63 in 10/29/2015 BP:  96/70  10/29/2015 BMI:  42.34  10/29/2015 A1C 8.500 % 10/22/2015 CBG: 255 10/29/2015 H/o Newly diagnosed diabetes with significant increase in A1c with dietary indiscretion. H/o drinking lots of cokes and eating more desserts.  No weight loss, urinary frequency, polydipsia. Treatment started with metformin to hep prevent significant hyperglycemia.  Primary MD hopeful to improve with diet modification to avoid additional medication management due to progressive dementia.   Alzheimer's  / Severe Dementia H/o patient has memory problems and difficulty completing sentences.  Patient requires 24/7 supervised care and is unable to perform ADLs or IADLs without supervision and assistance.  Husband has noted rapid changes in memory over the past several months; states no worsening symptoms over the past month.     Medications:  Co-pay cost issues: none  Prevnar (PCV13) Va N/D Pneumovax (PPSV2 05/22/2008 Flu Vaccine 10/22/2015 tDAP Vaccine N/D Medication Reconciliation completed with husband 12/11/2015  Encounter Medications:  Outpatient Encounter Prescriptions as of 12/11/2015  Medication Sig Note  . aspirin 81 MG tablet Take 81 mg by mouth daily.   Marland Kitchen atorvastatin (LIPITOR) 20 MG tablet Take 20 mg by mouth at bedtime.   . Calcium-Vit D3-Phytosterols ([place]) [phone] MG-UNIT-MG TABS Take by mouth daily.   . cetirizine (ZYRTEC) 10 MG tablet Take 10 mg by mouth daily.   . diphenhydrAMINE (BENADRYL) 25 MG tablet Take 25 mg by mouth every 6 (six) hours as needed.   . donepezil (ARICEPT) 10 MG tablet Take 10 mg by  mouth at bedtime.   Marland Kitchen escitalopram (LEXAPRO) 10 MG tablet Take 10 mg by mouth daily.   Marland Kitchen guaiFENesin (MUCINEX) 600 MG 12 hr tablet Take by mouth 2 (two) times daily.   . hydrochlorothiazide (HYDRODIURIL) 25 MG tablet Take 25 mg by  mouth daily. 11/08/2015: Not Taking:  Discontinued 10/29/15 by Dr. Brigitte Pulse due to relative hypotension.    . Hydrocortisone (CORTIZONE-10 ECZEMA EX) Apply 1 % topically as needed.   . Memantine HCl ER (NAMENDA XR) 28 MG CP24 Take 28 mg by mouth daily.   . metFORMIN (GLUCOPHAGE) 500 MG tablet Take 500 mg by mouth 2 (two) times daily with a meal.   . Multiple Vitamin (MULTIVITAMIN) capsule Take 1 capsule by mouth daily.   . Omega-3 Krill Oil 1000 MG CAPS Take by mouth daily.   Marland Kitchen telmisartan (MICARDIS) 80 MG tablet Take 80 mg by mouth daily.    No facility-administered encounter medications on file as of 12/11/2015.    Fall Risk  11/19/2015 11/08/2015  Falls in the past year? No No  Risk for fall due to : Impaired vision;Medication side effect;Mental status change Impaired vision;Medication side effect;Mental status change    Preventives: Hearing: no issues / never screened Eyes: Dr.   Due for an exam and wears glasses.  Dentist: yearly Podiatrist: none  Mammogram: 06/13/2013 PAP: 09/05/2012 Colonoscopy:  11/06/2008 Smoker:  Current everyday smoker<1/2 pack per day  Assessment: Husband states he has not had time to follow-up on resources provided and requested another follow-up call in one month to discuss again.   Plan:  Referral Date:  103/2017 Screening and Initial Assessment: 11/07/2015 Telephonic RN CM date:  11/07/2015 Program:  DM  11/07/2015  DM:  Newly diagnosed diabetes with significant increase in A1c 8.5 with dietary indiscretion -RN CM provided education on DM education and management.  -RN CM provided  Diabetes diet education.  -RN CM provided health risk associated to unmanaged diabetes  Emmi Educational Materials (mailed 11/08/2015 - reviewed 12/11/15) -Diabetes Diet - Type 2 -Diabetes: Why Get Your A1C Checked? -Diabetes: Controlling Blood Sugar -Diabetes: How To Check Your Blood Sugar -Diabetes - When You Are Sick -Blood Sugar, Weight and  Blood Pressure  tracking forms  Alzheimer with progressive memory decline -RN CM discussed health and  safety concerns relating to smoking and memory issues.  Emmi Educational Materials (mailed 11/08/2015 - reviewed 12/11/2015 but husband has not followed-up yet.) -Alzheimer's - Resources   Advance Directives: -RN CM requested copy of Advance Directive to place on file in the [place]. -RN CM encouraged to provide copy of Advance Directive to Dr. Brigitte Pulse No progress over the past 30 days. / Continued.  [facility] Social Work Referral 11/07/2015 (remains active) H/o Alzheimer's disease, severe with progression over the past several months. -Emergency planning/management officer for Alzheimer's; [facility] for [employer];  and / or LTC resources for in-home services.     RN CM advised in next [facility] scheduled contact call within next 30 days for monthly assessment and / or care coordination services as needed.  RN CM advised to please notify MD of any changes in condition prior to scheduled appt's.   RN CM provided contact name and # [phone] or main office # [phone] and 24-hour nurse line # [phone].  RN CM confirmed patient is aware of 911 services for urgent emergency needs.  [facility] CM Care Plan Problem One   Flowsheet Row Most Recent Value  Care Plan Problem One  Knowledge deficit relating  to new diagnosis of DM 2.   Role Documenting the Problem One  Care Management Telephonic Coordinator  Care Plan for Problem One  Active  THN Long Term Goal (31-90 days)  PCG's will engage in DM education and demonstrate improved knowledge of home self management program over the next 31-90 days.   THN Long Term Goal Start Date  11/08/15  Interventions for Problem One Long Term Goal  RN CM will provide verbal and written eduation over the next 31-90 days.   THN CM Short Term Goal #1 (0-30 days)  PCG will monitor home BS readings over the next 30 days.   THN CM Short Term Goal #1 Start Date  12/11/15   Interventions for Short Term Goal #1  RN CM will provide education on BS management interventions over the next 30 days.   THN CM Short Term Goal #2 (0-30 days)  PCG will improve knowledge of DM diet over the next 30 days.   THN CM Short Term Goal #2 Start Date  12/11/15  Interventions for Short Term Goal #2  RN CM will provide education on DM diet mangement over the next 30 days.   THN CM Short Term Goal #3 (0-30 days)  PCG will review a couple of [employer] to be discussed over the next 30 days.   THN CM Short Term Goal #3 Start Date  11/08/15  Interventions for Short Tern Goal #3  RN CM will send Emmi Education materials to prepare for future calls and education over the next 30 days.     [facility] CM Care Plan Problem Two   Flowsheet Row Most Recent Value  Care Plan Problem Two  Community Resources   Role Documenting the Problem Two  Care Management Telephonic Coordinator  Care Plan for Problem Two  Active  THN CM Short Term Goal #1 (0-30 days)  PCG will engage with [facility] SW Referral over the next 30 days.   THN CM Short Term Goal #1 Start Date  11/08/15  [facility] CM Short Term Goal #1 Met Date   12/11/15  Interventions for Short Term Goal #2   RN CM will send SW Referral for Community LTC resources over the next 30 days.     [facility] CM Care Plan Problem Three   Flowsheet Row Most Recent Value  Care Plan Problem Three  Knowledge deficit associated to patietn with Alzheimers and smoking.   Role Documenting the Problem Three  Care Management Telephonic Coordinator  Care Plan for Problem Three  Active  THN CM Short Term Goal #1 (0-30 days)  PCG will monitor safety and remain with patient while smoking over the next 30 days.   THN CM Short Term Goal #1 Start Date  12/11/15  THN CM Short Term Goal #1 Met Date  12/11/15  Interventions for Short Term Goal #1  RN CM to provide ongoing safety monitoring of Alzheimer's patient and smoking over the next 30 days.      [name],  BSN, RN, [place] Management Care Management Coordinator [phone] Direct [phone] Cell [phone] Office [phone] Fax [email or web address]

## 2015-12-23 ENCOUNTER — Other Ambulatory Visit: Payer: Self-pay | Admitting: Licensed Clinical Social Worker

## 2015-12-23 NOTE — Patient Outreach (Signed)
[facility]) Care Management  12/23/2015  OSCEOLA WEVER 07-15-1955 [id number]   Assessment-CSW completed third outreach attempt today. CSW unable to reach patient successfully. CSW left a HIPPA compliant voice message encouraging patient to return call once available.  Plan-CSW will await return call or will complete fourth and final outreach within two weeks.  [name], BSW, MSW, [email or web address] Phone: [phone] Fax: [phone]

## 2015-12-25 ENCOUNTER — Other Ambulatory Visit: Payer: Self-pay | Admitting: Licensed Clinical Social Worker

## 2015-12-25 ENCOUNTER — Encounter: Payer: Self-pay | Admitting: Licensed Clinical Social Worker

## 2015-12-25 NOTE — Patient Outreach (Signed)
[facility]) Care Management  12/25/2015  RONNEKA KELCH [birth year] [id number]   Assessment- CSW completed final outreach attempt on 12/25/15 but was unable to reach family successfully. CSW has had difficulty maintaining contact with family after initial home visit. CSW will send outreach barrier letter at this time.  Plan-CSW will await 10 business days before completing social work discharge.  [name], BSW, MSW, [email or web address] Phone: [phone] Fax: [phone]

## 2016-01-03 ENCOUNTER — Ambulatory Visit: Payer: Self-pay

## 2016-01-07 ENCOUNTER — Other Ambulatory Visit: Payer: Self-pay | Admitting: Licensed Clinical Social Worker

## 2016-01-07 NOTE — Patient Outreach (Signed)
[place] [facility]) Care Management  01/07/2016  Barbara Gill [birth year] [id number]   Assessment- CSW will complete social work discharge at this time. CSW has been unable to maintain contact with family. CSW has mailed outreach barrier letter and waited to hear back without success.  Plan-CSW will provide notification to [facility] and discharge patient from caseload at this time.  Barbara Gill, BSW, MSW, [name] Phone: [phone] Fax: [phone]

## 2016-01-08 ENCOUNTER — Ambulatory Visit: Payer: Self-pay

## 2016-01-09 ENCOUNTER — Ambulatory Visit: Payer: Self-pay

## 2016-01-10 ENCOUNTER — Other Ambulatory Visit: Payer: Self-pay

## 2016-01-10 NOTE — Patient Outreach (Signed)
[facility] [facility]) Care Management  01/10/2016  Barbara Gill [birth year] [id number]   Telephonic Monthly Assessment   Referral Date:10/29/2015 Referral Source: Dr. Gwyndolyn Saxon [facility] Issue: Severe Dementia Insurance: [employer] Ad[employer]age Medicare Program:  DM  11/07/2015  Providers: Primary MD: Dr. Lendon Ka. Brigitte Pulse -  last appt: 10/30/2015  Neurologist:  Past services with [facility] but is no longer seeing.  Neurologist:  Dr. Star Age, [facility]  (OSA & CPAP services)  HH: None  Psycho/Social: [place] [address] [phone] (H) Patient lives in the home with her husband, caregiver, Kataryna Kin [phone] cell.  Husband is also manages his 3 yo mothers care in [place], [place].  Husband retired last year and is a Quarry manager.  H/o remarried with 2 sons, 1 granddaughter and 1 grandson.  Husband reports no support provided by family and he is the sole caregiver.   Mobility: ambulates with no assistive devices Falls: none Pain: none Depression: none- states patient is laughing more over the last month and seems happier.  Transportation: Husband Caregiver: Husband  Emergency Contact: Husband Advance Directive: Yes; HCPOA, Husband, Khalise Bizzle Consent:  Per Dr. Brigitte Pulse:  Contact patients PCG/husband/[name] Offerdahl.  Husband consents to [facility] services.  DME: CBG meter and supplies, CPAP, eyeglasses   Co-morbidities:   Alzheimer's / Severe Dementia, DM type 2 with hyperglycemia, Hyperlipidemia, Nicotine addiction, anxiety, depression, ADD, OSA with CPAP, Morbid Obesity Admissions: 0 ER visits: 0  DM 2 H/o Newly diagnosed diabetes with significant increase in A1c 8.5  with dietary indiscretion.  H/o drinking lots of cokes and eating more desserts.  No weight loss, urinary frequency, polydipsia.  Treatment started with metformin to hep prevent significant hyperglycemia.  Primary MD hopeful to improve  with diet modification to avoid additional medication management due to progressive dementia.  Once a day blood sugar checks:  Mornings 120-130 average readings.   BP 96/70 11/07/2015 Weight 239 lb (108 kg) 11/07/2015 Height 63 in (160 cm) 11/07/2015 BMI 42.40 (Obese Class III) 11/08/2015  Lipid Panel completed 08/13/2014 HDL 34.000 % 08/13/2014 LDL 66.000 mg 08/13/2014 Cholesterol, total 131.000 m 08/13/2014 Triglycerides 153.000 08/13/2014 A1C 8.500 % 10/22/2015 Glucose Random 228.000 m 10/23/2015  Alzheimer's  / Severe Dementia H/o patient has memory problems and difficulty completing sentences.  Patient requires 24/7 supervised care and is unable to perform ADLs or IADLs without supervision and assistance.  Husband has noted rapid changes in memory over the past several months; states no worsening symptoms over the past month.     LTC Plan:   H/o Husband is not ready to place patient in a Memory Care Unit but has been thinking about options for LTC needs.  Husband has not yet researched or visited any facilities. Patient has NO LTC insurance.   Husband is open to resource materials regarding options of Memory Care Units in [facility] and / or in-home service options to assist with care and increasing level of care needs.  Resources provided on 11/07/2015 but husband states he has not had a chance to follow-up this month.   Medications:  Co-pay cost issues: none  Prevnar (PCV13) Va N/D Pneumovax (PPSV2 05/22/2008 Flu Vaccine 10/22/2015 tDAP Vaccine N/D  Encounter Medications:  Outpatient Encounter Prescriptions as of 01/10/2016  Medication Sig Note  . aspirin 81 MG tablet Take 81 mg by mouth daily.   Marland Kitchen atorvastatin (LIPITOR) 20 MG tablet Take 20 mg by mouth at bedtime.   Marland Kitchen  Calcium-Vit D3-Phytosterols ([place]) [phone] MG-UNIT-MG TABS Take by mouth daily.   . cetirizine (ZYRTEC) 10 MG tablet Take 10 mg by mouth daily.   . diphenhydrAMINE (BENADRYL) 25 MG  tablet Take 25 mg by mouth every 6 (six) hours as needed.   . donepezil (ARICEPT) 10 MG tablet Take 10 mg by mouth at bedtime.   Marland Kitchen escitalopram (LEXAPRO) 10 MG tablet Take 10 mg by mouth daily.   Marland Kitchen guaiFENesin (MUCINEX) 600 MG 12 hr tablet Take by mouth 2 (two) times daily.   . hydrochlorothiazide (HYDRODIURIL) 25 MG tablet Take 25 mg by mouth daily. 11/08/2015: Not Taking:  Discontinued 10/29/15 by Dr. Brigitte Pulse due to relative hypotension.    . Hydrocortisone (CORTIZONE-10 ECZEMA EX) Apply 1 % topically as needed.   . Memantine HCl ER (NAMENDA XR) 28 MG CP24 Take 28 mg by mouth daily.   . metFORMIN (GLUCOPHAGE) 500 MG tablet Take 500 mg by mouth 2 (two) times daily with a meal.   . Multiple Vitamin (MULTIVITAMIN) capsule Take 1 capsule by mouth daily.   . Omega-3 Krill Oil 1000 MG CAPS Take by mouth daily.   Marland Kitchen telmisartan (MICARDIS) 80 MG tablet Take 80 mg by mouth daily.    No facility-administered encounter medications on file as of 01/10/2016.    Fall Risk  12/11/2015 11/19/2015 11/08/2015  Falls in the past year? No No No  Risk for fall due to : Impaired vision;Medication side effect;Mental status change Impaired vision;Medication side effect;Mental status change Impaired vision;Medication side effect;Mental status change    Preventives: Hearing: no issues / never screened Eyes: Dr.   Due for an exam and wears glasses.  Dentist: yearly Podiatrist: none  Mammogram: 06/13/2013 PAP: 09/05/2012 Colonoscopy:  11/06/2008 Smoker:  Current everyday smoker<1/2 pack per day  Assessment: Husband states he has not had time to follow-up on resources provided and requested another follow-up call in one month to discuss again.  Unsuccessful SW engagement.   RN CM will continue to follow A1C following new diagnosis, new treatment and home Blood sugar monitoring.  RN CM will discharge once A1C repeated and shows improvement.    Plan:  Referral Date:  103/2017 Screening and Initial Assessment:  11/07/2015 Telephonic RN CM date:  11/07/2015 Program:  DM  11/07/2015 MD Quarterly Report due 01/2016  DM:  Newly diagnosed diabetes with significant increase in A1c 8.5 with dietary indiscretion -RN CM provided education on DM education and management.  -RN CM provided  Diabetes diet education.  -RN CM provided health risk associated to unmanaged diabetes  Emmi Educational Materials (mailed 11/08/2015 - reviewed 12/11/15.  Husband is also diabetic and verbalized understanding of DM management. ) -Diabetes Diet - Type 2 -Diabetes: Why Get Your A1C Checked? -Diabetes: Controlling Blood Sugar -Diabetes: How To Check Your Blood Sugar -Diabetes - When You Are Sick -Blood Sugar, Weight and  Blood Pressure tracking forms  Alzheimer with progressive memory decline -RN CM discussed health and  safety concerns relating to smoking and memory issues.  Emmi Educational Materials (mailed 11/08/2015 - reviewed 12/11/2015) -Alzheimer's - Resources   Advance Directives: -RN CM requested copy of Advance Directive to place on file in the [place]. -RN CM encouraged to provide copy of Advance Directive to Dr. Brigitte Pulse No progress over the past 30 days. / Continued.  [facility] Social Work Referral 11/07/2015  (closed 01/07/16 due to unsuccessful outreach).  H/o Alzheimer's disease, severe with progression over the past several months. -Emergency planning/management officer for Alzheimer's;  [facility] for [employer] Units;  and / or LTC resources for in-home services.  Husband states he is just not ready to start any thing until after the first of the year (01/2016).   RN CM advised to notify RN CM or Primary when ready and New SW Referral can be sent.      RN CM advised in next [facility] scheduled contact call within next 30 days for monthly assessment and / or care coordination services as needed.  RN CM advised to please notify MD of any changes in condition prior to scheduled appt's.   RN CM provided contact name  and # [phone] or main office # [phone] and 24-hour nurse line # [phone].  RN CM confirmed patient is aware of 911 services for urgent emergency needs.  [name], BSN, RN, [place] Management Care Management Coordinator [phone] Direct [phone] Cell [phone] Office [phone] Fax [email or web address]

## 2016-01-13 DIAGNOSIS — D485 Neoplasm of uncertain behavior of skin: Secondary | ICD-10-CM | POA: Diagnosis not present

## 2016-01-13 DIAGNOSIS — L814 Other melanin hyperpigmentation: Secondary | ICD-10-CM | POA: Diagnosis not present

## 2016-01-13 DIAGNOSIS — L700 Acne vulgaris: Secondary | ICD-10-CM | POA: Diagnosis not present

## 2016-01-13 DIAGNOSIS — L821 Other seborrheic keratosis: Secondary | ICD-10-CM | POA: Diagnosis not present

## 2016-02-03 DIAGNOSIS — I10 Essential (primary) hypertension: Secondary | ICD-10-CM | POA: Diagnosis not present

## 2016-02-03 DIAGNOSIS — E784 Other hyperlipidemia: Secondary | ICD-10-CM | POA: Diagnosis not present

## 2016-02-03 DIAGNOSIS — Z6841 Body Mass Index (BMI) 40.0 and over, adult: Secondary | ICD-10-CM | POA: Diagnosis not present

## 2016-02-03 DIAGNOSIS — G308 Other Alzheimer's disease: Secondary | ICD-10-CM | POA: Diagnosis not present

## 2016-02-03 DIAGNOSIS — R946 Abnormal results of thyroid function studies: Secondary | ICD-10-CM | POA: Diagnosis not present

## 2016-02-03 DIAGNOSIS — E1165 Type 2 diabetes mellitus with hyperglycemia: Secondary | ICD-10-CM | POA: Diagnosis not present

## 2016-02-07 ENCOUNTER — Ambulatory Visit: Payer: Self-pay

## 2016-02-10 ENCOUNTER — Other Ambulatory Visit: Payer: Self-pay

## 2016-02-10 NOTE — Patient Outreach (Signed)
[place] [facility]) Care Management  02/10/2016  KRISTEY TOMSON 1956-01-16 [id number]   Telephonic Monthly Assessment  Outreach call #1.  Contact not reached.  RN CM left HIPAA compliant voice message with name and number for call back.   [name], BSN, RN, [place] Management Care Management Coordinator [phone] Direct [phone] Cell [phone] Office [phone] Fax [email or web address]

## 2016-02-11 ENCOUNTER — Other Ambulatory Visit: Payer: Self-pay

## 2016-02-11 NOTE — Patient Outreach (Signed)
[place] [facility]) Care Management  02/11/2016  [name] 1955-07-08 [id number]   Telephonic Monthly Assessment  Outreach call #2.  Contact not reached. No answer following multiple rings.  RN CM will follow up with next contact attempt within one week.   [name], BSN, RN, [place] Care Management Care Management Coordinator [phone] Direct [phone] Cell [phone] Office [phone] Fax [email or web address]

## 2016-02-12 ENCOUNTER — Other Ambulatory Visit: Payer: Self-pay

## 2016-02-12 NOTE — Patient Outreach (Signed)
[place] [facility]) Care Management  02/12/2016  Barbara Gill 12-21-1955 [id number]   Telephonic Monthly Assessment  Outreach call #3.  Contact, husband, caregiver, [name] [name] [phone] cell not reached.  RN CM left HIPAA compliant voice message with name and number for call back.   RN CM mailed unsuccessful outreach letter and will close case within 2 weeks if no response.   [name], BSN, RN, [place] Management Care Management Coordinator [phone] Direct [phone] Cell [phone] Office [phone] Fax [name]

## 2016-02-26 ENCOUNTER — Other Ambulatory Visit: Payer: Self-pay

## 2016-02-26 NOTE — Patient Outreach (Addendum)
[facility]) Care Management  02/26/2016  Barbara Gill 02/18/1955 [id number]   Case Closure  Patient or Caregiver not reached with several attempted calls and no response from unsuccessful outreach letter.  H/o PER MR: KPN:   A1C 6.100 % 02/03/2016 BP 118/83 02/03/2016 Weight 233 lb (106 kg) 02/03/2016 Height 63 in (160 cm) 02/03/2016 BMI 41.27 (Obese Class III) 02/03/2016 Case closure letter sent to PCP and patient.  [place] notified.    [name], BSN, RN, [place] Management Care Management Coordinator [phone] Direct [phone] Cell [phone] Office [phone] Fax [email or web address]

## 2016-05-04 DIAGNOSIS — I10 Essential (primary) hypertension: Secondary | ICD-10-CM | POA: Diagnosis not present

## 2016-05-04 DIAGNOSIS — E784 Other hyperlipidemia: Secondary | ICD-10-CM | POA: Diagnosis not present

## 2016-05-04 DIAGNOSIS — E1165 Type 2 diabetes mellitus with hyperglycemia: Secondary | ICD-10-CM | POA: Diagnosis not present

## 2016-05-08 DIAGNOSIS — G4733 Obstructive sleep apnea (adult) (pediatric): Secondary | ICD-10-CM | POA: Diagnosis not present

## 2016-05-08 DIAGNOSIS — E1165 Type 2 diabetes mellitus with hyperglycemia: Secondary | ICD-10-CM | POA: Diagnosis not present

## 2016-05-08 DIAGNOSIS — F17201 Nicotine dependence, unspecified, in remission: Secondary | ICD-10-CM | POA: Diagnosis not present

## 2016-05-08 DIAGNOSIS — I10 Essential (primary) hypertension: Secondary | ICD-10-CM | POA: Diagnosis not present

## 2016-05-08 DIAGNOSIS — R4701 Aphasia: Secondary | ICD-10-CM | POA: Diagnosis not present

## 2016-05-08 DIAGNOSIS — E784 Other hyperlipidemia: Secondary | ICD-10-CM | POA: Diagnosis not present

## 2016-05-08 DIAGNOSIS — G309 Alzheimer's disease, unspecified: Secondary | ICD-10-CM | POA: Diagnosis not present

## 2016-05-08 DIAGNOSIS — Z0000 Encounter for general adult medical examination without abnormal findings: Secondary | ICD-10-CM | POA: Diagnosis not present

## 2016-05-08 DIAGNOSIS — F900 Attention-deficit hyperactivity disorder, predominantly inattentive type: Secondary | ICD-10-CM | POA: Diagnosis not present

## 2016-05-08 DIAGNOSIS — F418 Other specified anxiety disorders: Secondary | ICD-10-CM | POA: Diagnosis not present

## 2016-05-08 DIAGNOSIS — Z23 Encounter for immunization: Secondary | ICD-10-CM | POA: Diagnosis not present

## 2016-08-12 DIAGNOSIS — I10 Essential (primary) hypertension: Secondary | ICD-10-CM | POA: Diagnosis not present

## 2016-08-12 DIAGNOSIS — F325 Major depressive disorder, single episode, in full remission: Secondary | ICD-10-CM | POA: Diagnosis not present

## 2016-08-12 DIAGNOSIS — E1165 Type 2 diabetes mellitus with hyperglycemia: Secondary | ICD-10-CM | POA: Diagnosis not present

## 2016-08-12 DIAGNOSIS — G308 Other Alzheimer's disease: Secondary | ICD-10-CM | POA: Diagnosis not present

## 2016-08-12 DIAGNOSIS — Z6838 Body mass index (BMI) 38.0-38.9, adult: Secondary | ICD-10-CM | POA: Diagnosis not present

## 2016-08-12 DIAGNOSIS — E784 Other hyperlipidemia: Secondary | ICD-10-CM | POA: Diagnosis not present

## 2016-08-12 DIAGNOSIS — R4701 Aphasia: Secondary | ICD-10-CM | POA: Diagnosis not present

## 2016-10-14 ENCOUNTER — Ambulatory Visit: Payer: PPO | Admitting: Neurology

## 2016-11-20 DIAGNOSIS — I10 Essential (primary) hypertension: Secondary | ICD-10-CM | POA: Diagnosis not present

## 2016-11-20 DIAGNOSIS — E1165 Type 2 diabetes mellitus with hyperglycemia: Secondary | ICD-10-CM | POA: Diagnosis not present

## 2016-11-20 DIAGNOSIS — F325 Major depressive disorder, single episode, in full remission: Secondary | ICD-10-CM | POA: Diagnosis not present

## 2016-11-20 DIAGNOSIS — E7849 Other hyperlipidemia: Secondary | ICD-10-CM | POA: Diagnosis not present

## 2016-11-20 DIAGNOSIS — R4701 Aphasia: Secondary | ICD-10-CM | POA: Diagnosis not present

## 2016-11-20 DIAGNOSIS — G309 Alzheimer's disease, unspecified: Secondary | ICD-10-CM | POA: Diagnosis not present

## 2016-11-20 DIAGNOSIS — Z23 Encounter for immunization: Secondary | ICD-10-CM | POA: Diagnosis not present

## 2016-11-20 DIAGNOSIS — Z6838 Body mass index (BMI) 38.0-38.9, adult: Secondary | ICD-10-CM | POA: Diagnosis not present

## 2016-11-24 ENCOUNTER — Other Ambulatory Visit: Payer: Self-pay

## 2016-11-24 NOTE — Patient Outreach (Signed)
[place] [facility]) Care Management  11/24/2016  Barbara Gill 1955/09/08 [id number]   Telephone Screen  Referral Date: 11/24/16 Referral Source: MD office (Dr. Brigitte Pulse) Referral Reason: "severe Alzheimer's disease, DM, HTN, needs resources"  Insurance: HTA     Outreach attempt # 1 to patient. Spoke with spouse([name]). Patient has documented history of "severe Alzheimer's and nonverbal." Spouse reported he is unable to complete screening call at this time and requested a call back possibly on tomorrow.      Plan: RN CM will make outreach attempt to spouse within three business days.    [name], RN,BSN,CCM [place] Management Telephonic Care Management Coordinator Direct Phone: [phone] Toll Free: [phone] Fax: [phone]

## 2016-11-25 ENCOUNTER — Other Ambulatory Visit: Payer: Self-pay

## 2016-11-25 NOTE — Patient Outreach (Addendum)
[place] [facility]) Care Management  11/25/2016  Kimm Sider [birth year] [id number]   Telephone Screen  Referral Date: 11/24/16 Referral Source: MD office (Dr. Brigitte Pulse) Referral Reason: "severe Alzheimer's disease, DM, HTN, needs resources"  Insurance: HTA   Outreach attempt #2 to patient/spouse. Spoke with spouse and screening call completed. Social: Patient resides in he home along with spouse. Spouse vices that fairly is dependent and requires assistance with all ADLs/IADLS. He voices that last fall was about six months ago. No assistive devices currently in the home. He is taking patient to all MD appts. Spouse voices that he is interested in some caregiver support/resources. He has no family support to assist with care. He states that occasionally the patient's mother who lives in [place] will come and visit and assist with care. He is interested in info on possible adult daycare and day programs as well as respite options to allow him some free time to run errands and do things. He voices that currently when he has to run errands he either takes patient with him or leaves her home alone for a brief period of time. Spouse desires to keep patient in the home.   Conditions: Per chart review patient has PMH of DM,HTN, "sever Alzheimer's disease, depression, anxiety, HLD, and former smoker. Spouse voices that patient is basically nonverbal. He states that on occassions she will respond with "yes or no" responses. He reports that he had serious issues with her wandering and leaving the home but since her meds have been adjusted the issue has seemed to resolve. Patient was taken off diabetic med as diabetes is under control at present. He is checking cbgs about once a day and reports cbgs ranges are 100-120. They have a BP monitor in the home but he is not routinely checking patient's BP. Spouse stets he needs further support and education on how to safely manage and care for patient in the  home as her disease progresses. He is interested in any help/support he can get.   Medications: Spouse reports patient is taking about seven meds. He denies any issues affording/managing meds.   Appointments: Patient is only followed by PCP at present and saw MD on 11/20/16.  Advance Directives: Spouse states that he is patient's HC POA. He does not recall providing medical team with copy of documents. RN CM encouraged spouse to do so. He also states he "thinks" patient is a DNR but is unsure.  Consent: [facility] services reviewed and discussed. Spouse gave verbal consent for [facility] services.   Plan: RN CM will notify [facility] administrative assistant of case status. RN CM will send referral to [facility] RN for further in home eval/assessment of care needs and management of chronic conditions. RN CM will send [facility] SW referral for possible assistance with community resources related to adult care programs, respite and caregiver support options.    [name], RN,BSN,CCM [place] Management Telephonic Care Management Coordinator Direct Phone: [phone] Toll Free: [phone] Fax: [phone]

## 2016-11-26 ENCOUNTER — Other Ambulatory Visit: Payer: Self-pay | Admitting: Licensed Clinical Social Worker

## 2016-11-26 NOTE — Patient Outreach (Signed)
[place] [facility]) Care Management  11/26/2016  Barbara Gill [birth year] [id number]  Assessment-CSW completed outreach attempt today after receiving new referral on patient. CSW has been active with patient in the past. CSW unable to reach patient successfully as phone is disconnected. CSW was unable to leave a voice message.   Plan-CSW will await return call or complete an additional outreach within one week.   [name], BSW, MSW, [email or web address] Phone: [phone] Fax: [phone]

## 2016-11-27 ENCOUNTER — Encounter: Payer: Self-pay | Admitting: *Deleted

## 2016-11-27 ENCOUNTER — Other Ambulatory Visit: Payer: Self-pay | Admitting: *Deleted

## 2016-11-27 ENCOUNTER — Other Ambulatory Visit: Payer: Self-pay | Admitting: Licensed Clinical Social Worker

## 2016-11-27 NOTE — Patient Outreach (Signed)
[place] [facility]) Care Management Barbara Gill Telephone Outreach  11/27/2016  Barbara Gill 1955/06/03 [id number]  Unsuccessful telephone outreach to Barbara Gill, spouse/ caregiver of Barbara Gill, 61 y/o female referred to [place] on PCP referral; [facility] telephonic RN CM completed screening on 11/25/16, and placed referral to [place] CM for further evaluation and assessment of patient/ caregiver needs/ resources.  Patient has history including, but not limited to, DM, OSA on CPAP, HLD/ HTN, severe alzheimer's disease/ nonverbal, and anxiety/ depression, including ADD.  Attempted both phone numbers listed for patient/ spouse; phone number #1 received automated outgoing voicemail message, stating that the number had been changed, disconnected, and was no longer in service.  Phone number #2 (listed as spouse's number) rang multiple times without physical or voice mail pick up; was unable to leave HIPAA compliant voice mail message  requesting return call back.  Plan:  Will re-attempt [place] telephone outreach again next week if I do not hear back from patient/ spouse first.  Barbara Rack, RN, BSN, [employer] Coordinator [facility] Care Management  [phone]

## 2016-11-27 NOTE — Patient Outreach (Signed)
[place] [facility]) Care Management  11/27/2016  Barbara Gill 1955/09/12 [id number]  Assessment-CSW completed second outreach attempt today. CSW unable to reach patient or spouse successfully. CSW left a HIPPA compliant voice message encouraging family to return call once available.  Plan-CSW will await return call or complete an additional outreach if needed.  [name], BSW, MSW, [email or web address] Phone: [phone] Fax: [phone]

## 2016-11-30 ENCOUNTER — Other Ambulatory Visit: Payer: Self-pay | Admitting: Licensed Clinical Social Worker

## 2016-11-30 NOTE — Patient Outreach (Signed)
[place] [facility]) Care Management  11/30/2016  Barbara Gill August 13, 1955 [id number]  Assessment- CSW completing second outreach attempt and was able to reach patient's spouse successfully. HIPPA verifications were provided. Spouse remembers CSW who previously worked with family. Spouse reports that it has becoming more difficult to take care of patient and that he is in need of personal care resources. Spouse does not wish to place patient at a facility at this time and prefers to not place her until it is the last resort. Spouse reports that he is interested in gaining an aide or taking patient to an adult day program. CSW successfully completed referral to [facility] [place] last year. Spouse reports receiving a letter from them in the mail not too long ago. Spouse is wanting to know how much longer she has on the wait list. CSW completed call to [facility] on the other line and was able to reach a staff member. CSW was informed that patient is on the "Under 69 years old wait list" and is number 24 on the wait list. However, patient is going to turn 62 on 02/20/17. CSW was informed that patient should contact their program once she turns 63 and then she will be transitioned up the front of the wait list. CSW provided information to spouse and he is very appreciative of this update. He understands that he should contact program as soon as she turns 61 years of age. CSW questioned if spouse would like for CSW to complete a home visit this week in order to review the rest of the personal care resources. Spouse prefers to go over resources again over the phone. However, he is about to take patient to the dentist and prefers CSW to contact him later in the week. CSW agreeable to do so.  Plan-CSW will complete additional outreach this week to review personal care resources.  [name], BSW, MSW, [email or web address] Phone:  [phone] Fax: [phone]

## 2016-12-01 ENCOUNTER — Encounter: Payer: Self-pay | Admitting: *Deleted

## 2016-12-01 ENCOUNTER — Other Ambulatory Visit: Payer: Self-pay | Admitting: *Deleted

## 2016-12-01 NOTE — Patient Outreach (Signed)
[facility]) Care Management [place] Telephone Outreach  12/01/2016  YOCELINE BAZAR 1955/11/27 [id number]  Successful telephone outreach to [name], spouse/ caregiver of Irais Mottram, 61 y/o female referred to [place] on PCP referral; [facility] telephonic RN CM completed screening on 11/25/16, and placed referral to [place] CM for further evaluation and assessment of patient/ caregiver needs/ resources.  Patient has history including, but not limited to, DM, OSA on CPAP, HLD/ HTN, severe alzheimer's disease/ nonverbal, and anxiety/ depression, including ADD.  HIPAA/ identity verified with patient's spouse during phone call today, and verbal consent for [place] involvement in patient's care was obtained.  Medications: -- Has all medicationsand takes as prescribed;spouse denies questions about current medications.  -- spouse verbalizes good general understanding of the purpose and scheduling of medications.   -- spouse reports that he prepares patient's medications and patient takes when he provides to her. -- denies issues with swallowing medications -- spouse reports that patient is currently on depakote, which is not included on medication list in EMR; reports he is not sure of exact dosing during time of phone conversation; reports that patient was put on this medication "about a year ago" when she last visited with PCP; reports patient had been "leaving home and wandering around" prior to being on this medication.  Reports this medication "has really helped" patient, and that she is no longer leaving home without his knowledge and wandering around the neighborhood  Provider appointments: -- No upcoming provider appointments scheduled, per review of EMR; spouse reports last PCP (neurology) visit "was about a year ago."  Safety/ Mobility/ Falls: -- denies new/ recent falls, but states that patient fell "not too long ago," when in the  bathroom on wet floor after it was mopped -- spouse denies that patient uses assistive devices -- general fall risks/ prevention education briefly discussed with patient's spouse today  Holiday representative needs: -- spouse endorses community resource needs, stating that he solely cares for patient without assistance from family; reports that he "occasionally" can rely on patient's mother who lives out of town to assist, but this is not a regular occurrence -- spouse reports that he provides transportation for patient to all provider appointments, errands, etc -- spouse reports that patient's daily care needs have increased, as patient has become incontinent of urine and stool, and that he provides all care for patient with ADL's and IADL's; spouse reports that he is becoming fatigued and would like additional resources for patient's daily care -- spouse also reports that he is the primary family support for his elderly mother, who is 20 y/o and currently lives at [place] -- spouse confirms that he has spoken with/ actively working with [facility] CSW for information/ evaluation of additional resources for patient's care and caregiver resources  Scientist, physiological (AD) Planning:   -- spouse reports patient currently does have exisisting AD in place for [place] (himself), as well as Living Will, and denies desire to make changes in either today -- discussed need to have patient's AD's uploaded to EMR, spouse reports he will have this done  Self-health management of Alzheimer's Disease/ OSA/ HTN/ DM: -- spouse reports that he provides "essentially all daily care" for patient and endorses that this has become increasing difficult for him as patient's cognitive decline around Alzheimer's disease has steadily progressed   -- spouse reports that patient is no longer using her CPAP, as patient "just cannot keep" the  CPAP unit on," and he can't manage keeping the unit on her either, as he would not  get any sleep at all if he attempted to do so -- spouse endorses that patient "is physically pretty healthy," stating that his/ patient's primary needs revolve around daily care needs/ assistance  Patient's spouse denies further issues, concerns, or problems today.  I provided him with my direct phone number, the main [facility] CM office phone number, and the [facility] CM 24-hour nurse advice phone number should issues arise prior to next scheduled [place] outreach, and we scheduled [place] initial home visit in 2 weeks around patient's spouse scheduling preferences.  Plan:  Patient will take medications as prescribed and will attend all scheduled provider appointments  Patient's spouse will continue actively workingwith [facility] CSW for community/ care needs resource evaluation  I will make patient's PCP aware of [facility] CM involvement in patient's care  [place] RN CM involvement in patient's care to continue with initial home visit in 2 weeks   [facility] CM Care Plan Problem One     Most Recent Value  Care Plan Problem One  Level of care needs/ resources for patient with severe alzheimers who requires total assistance with ADL's, as evidenced by patient's spouse reporting  Role Documenting the Problem One  Care Management [place] for Problem One  Active  [facility] Long Term Goal   Over the next 60 days, patient's spouse will verbalize plan for additional resources for patient's care while she remains at home, as evidenced by patient's caregiver reporting during [facility] RN CCM outreach  [place] Term Goal Start Date  12/01/16  Interventions for Problem One Long Term Goal  Discussed with patient's caregiver current patient needs, current functional and cognitive status,  confirmed that caregiver is actively working with [facility] CSW, and scheduled [facility] RN CCM initial home visit with patient's careiver/ spouse     I appreciate the opportunity to participate in [place] care,  [name], RN, BSN, [employer] Coordinator [facility] Care Management  [phone]

## 2016-12-02 ENCOUNTER — Encounter: Payer: Self-pay | Admitting: *Deleted

## 2016-12-04 ENCOUNTER — Other Ambulatory Visit: Payer: Self-pay | Admitting: Licensed Clinical Social Worker

## 2016-12-04 NOTE — Patient Outreach (Signed)
[place] [facility]) Care Management  12/04/2016  ASHLYNE OLENICK 1955/04/28 [id number]  Assessment- CSW completed outreach call to patient's spouse and spouse answered. CSW questioned if this was a good time to go over personal care resources. Spouse states that he is in the middle of something and questions if CSW can contact him back later this afternoon. CSW happy to do so.  Plan-CSW will complete additional outreach this afternoon.  [name], BSW, MSW, [email or web address] Phone: [phone] Fax: [phone]

## 2016-12-04 NOTE — Patient Outreach (Signed)
Request received from Brooke Joyce, LCSW to mail patient personal care resources.  Information mailed today. 

## 2016-12-04 NOTE — Patient Outreach (Signed)
[place] [facility]) Care Management  12/04/2016  Barbara Gill 1955/03/30 [id number]   Assessment- CSW completed additional outreach to family today in order to discuss requested personal care assistance resources. Spouse answered and provided HIPPA verifications. Spouse is not interested in LTC placement and wishes to keep patient at home for as long as possible. However, family is unable to do PACE program. CSW spent time educating spouse on each available senior resource within [facility]. Spouse expresses interest in the [facility] (Adult Day Service) which is exactly 2.3 miles away from their residence. Program starts at $60 a day and they are able to provide financial assistance if needed. Spouse is also interested in the [facility] [place] which is their half day program and starts out at $43 per day. Spouse denies interest in both [facility] and the [facility]. CSW reminded spouse that he will need to contact [facility] [place] [facility] on patient's birthday to inform them that she is now 61 years of age and can start receiving services. This is a free personal care service provided through [place]. CSW also reviewed other personal care resources including private pay aide options. CSW questioned if spouse would like for CSW to complete a home visit in order to review all of these resources in person. Spouse declined but thanked CSW for the offer. Spouse would prefer for CSW to mail out information on [facility], [facility] Day [facility], [facility] (included a reminder to contact program on patient's birthday) and available personal care service resources. Spouse was very appreciative of phone call and resource review and denies any other social work needs and is agreeable to social work discharge.  Plan-CSW will update [facility] RNCM on social work discharge. CSW will send  requested resources to patient's residence.  Barbara Gill, BSW, MSW, [email or web address] Phone: [phone] Fax: [phone]

## 2016-12-15 ENCOUNTER — Other Ambulatory Visit: Payer: Self-pay | Admitting: *Deleted

## 2016-12-15 ENCOUNTER — Encounter: Payer: Self-pay | Admitting: *Deleted

## 2016-12-15 NOTE — Patient Outreach (Signed)
[facility]) Care Management  [place] Initial Home Visit 12/15/2016  Barbara Gill August 08, 1955 [id number]  KELIN NIXON is an 61 y.o. female referred to [place] on PCP referral; [facility] telephonic RN CM completed screening on 11/25/16, and placed referral to [place] CM for further evaluation and assessment of patient/ caregiver needs/ resources.Patient has history including, but not limited to, DM, OSA-previously on CPAP, HLD/ HTN, severe alzheimer's disease/ nonverbal, and anxiety/ depression, including ADD.  HIPAA/ identity verified for patient with patient's spouse, in person during home visit today, and spouse again provides verbal consent for [facility] RN CM involvement in patient's care.  Viewed prior [facility] CM written consent from 2017 with caregiver, and he declines making changes at this time.   Today, patient is awake and alert during entirety of home visit, and she frequently smiles and makes eye contact, although she is essentially nonverbal; patient is able to follow simple short requests, and she appears to be in no apparent or obvious distress throughout home visit.  Patient occasionally able to answer basic questions appropriately, but intermittently babbles incoherently throughout visit.  Spouse/ caregiver is present for home visit, and actively participates in all aspects of visit; caregiver/ spouse Barbara Gill is primary voice and essentially sole caregiver for patient.  Pleasant 75 minute visit.  Subjective: (Caregiver): "I am doing the best I can to take good care of her, but I know I'm going to need help as her disease worsens; I am already exhausted."   Assessment:  Barbara Gill has a very pleasant demeanor, and occasionally is able to appropriately follow requests from others, but she is essentially nonverbal due to progression of Alzheimer's Disease, and her spouse/ primary caregiver Barbara Gill is her primary voice/ caregiver.  Patient has had  increased care needs over last several months, including increasing episodes of incontinence, and spouse is becoming fatigued as her care needs have increased.  Patient and caregiver have no local family support.  Patient's caregiver/ spouse is interested in obtaining additional support for patient and caregiver assistance.  Caregiver/ spouse Barbara Gill further reports:   Medications: -- Has all medicationsand takes as prescribed;spouse denies questions about current medications.  -- spouse verbalizes good general understanding of the purpose and scheduling of medications.   -- spouse reports that he prepares patient's medications and patient takes when he provides to her. -- denies issues with swallowing medications -- medication reconciliation performed during home visit today  Provider appointments: -- No upcoming provider appointments scheduled, spouse reports last PCP (neurology) visit "was about a year ago," and verbalizes that he has PCP phone number with good rapport with provider; spouse reports he "feels free" to contact provider for any new issues/ concerns/ problems  Safety/ Mobility/ Falls: -- denies new/ recent falls, reports one fall "months ago" when in the bathroom on wet floor after it was mopped -- spouse denies that patient uses assistive devices, although he has walker/ cane and wheelchair available if needed -- patient demonstrates steady, purposeful gait with ambulation around her home today, without use of assistive devices; home has moderate clutter lying on floor, as spouse reports he is unable to stay "on top of" housework due to constant daily demands of caring for patient -- general fall risks/ prevention education discussed with patient/ spouse today  Social/ Community Resource needs: -- spouse endorses that he solely cares for patient without assistance from family; reports that he "occasionally" can rely on patient's mother who lives out of  town to assist, but  this is not a regular occurrence -- spouse reports that he provides transportation for patient to all provider appointments, errands, etc -- spouse also reports that he is the primary family support for his elderly mother, who is 39 y/o and currently lives at [place] -- spouse confirms that he has received printed list of care support resources from [place] and we reviewed these lists together today.  Spouse confirms that he has [place] phone numbers should additional needs/ questions arise in the future.  Advanced Directive (AD) Planning:   -- spouse reports patient currently does have exisisting AD in place for HCPOA (himself), as well as Living Will, and previously denied desire to make changes in either today; however, today, he reports that he is planning to make an appointment "soon" with attorney to have these documents "updated." -- discussed need to have patient's AD's uploaded to EMR, spouse reports he will have this done once documents are updated  Self-health management of Alzheimer's Disease/ OSA/ HTN/ DM: -- spouse reports that he provides "essentially all daily care" for patient and endorses that this has become increasing difficult for him as patient's cognitive decline has steadily progressed "over the last few months."  We discussed options for caregiver support, and I explained different levels of care available for consideration (CNA vs. Sitter/ personal care assistant, etc).  Together, we started a list of questions for caregiver to use as he begins making phone calls to various agencies, and I encouraged him to expand upon this list as he begins this process. -- spouse reports that patient's daily care needs have increased, as patient has become incontinent of urine and stool, and that he provides all care for patient with ADL's and IADL's; spouse reports that he is becoming fatigued and "knows" this will "only get worse."  Encouraged caregiver to contact resources for  information on resources for caregivers, provided pamphlets for [employer] and reviewed list from [facility] CSW -- reports skin around perineum intact, "without rash," and states that patient's skin "looks good;" we had a long pointed discussion around patient's increased incontinence, and I provided/ reviewed with caregiver EMMI educational material on bowel/ bladder training, as well as importance of general skin care with close monitoring to prevent skin complications; patient's exposed skin today is clean, dry, and intact and patient is well groomed.  Caregiver reports patient is able to still bathe herself with minimal assistance, although he remains physically present with patient when she bathes; spouse reports that during times of incontinence, he cleans patient himself, and reports that patient allows him to do so without hesitation. -- spouse again reports that patient is no longer using her CPAP, as patient "just cannot keep" the CPAP unit on," and he can't manage keeping the unit on her either, as he would not get any sleep at all if he attempted to do so; spouse reports that both he and patient "are night owls," and "sometimes stay up until 4 or 5 in the morning" before going to sleep.  Spouse reports that patient's PCP is aware that patient is no longer using CPAP -- spouse endorses that patient "is physically pretty healthy," stating that his/ patient's primary needs revolve around daily care needs/ assistance  Patient's spouse denies further issues, concerns, or problems today. I confirmed that he hasmy direct phone number, the main [facility] CM office phone number, and the [facility] CM 24-hour nurse advice phone number should issues arise prior to next  scheduled [facility] CM outreach by phone in 2 weeks.  Objective:    BP 98/64   Pulse (!) 52   Resp 16   SpO2 98%   Review of Systems  Constitutional: Negative.  Negative for diaphoresis, malaise/fatigue and weight loss.       Pleasant  obese female in no distress  Respiratory: Negative.  Negative for cough, shortness of breath and wheezing.   Cardiovascular: Negative.  Negative for chest pain and leg swelling.  Gastrointestinal: Negative.  Negative for constipation.       Spouse/ caregiver notes patient has become increasingly incontinent of stool/ urine; reports patient wears depends  Genitourinary: Negative.  Negative for dysuria.       Spouse/ caregiver notes patient has become increasingly incontinent of stool/ urine; reports patient wears depends   Musculoskeletal: Negative for falls and myalgias.  Skin: Negative.   Neurological: Negative for weakness.       Patient is essentially nonverbal; occasionally appropriately answers simple questions, and is intermittently able to follow simple requests; unable to state her name or her spouse's name  Psychiatric/Behavioral: Positive for memory loss. The patient is not nervous/anxious.    Physical Exam  Constitutional: She appears well-developed and well-nourished. No distress.  Cardiovascular: Regular rhythm, normal heart sounds and intact distal pulses. Bradycardia present.  Respiratory: Effort normal and breath sounds normal. No respiratory distress. She has no wheezes. She has no rales.  GI: Soft. Bowel sounds are normal.  Musculoskeletal: She exhibits no edema.  Neurological: She is alert.  Skin: Skin is warm and dry. No erythema.  Psychiatric: She has a normal mood and affect. Her behavior is normal.   Encounter Medications:   Outpatient Encounter Medications as of 12/15/2016  Medication Sig Note  . aspirin 81 MG tablet Take 81 mg by mouth daily.   Marland Kitchen atorvastatin (LIPITOR) 20 MG tablet Take 20 mg by mouth at bedtime.   . Calcium-Vit D3-Phytosterols (Supreme) [phone] MG-UNIT-MG TABS Take by mouth daily.   . cetirizine (ZYRTEC) 10 MG tablet Take 10 mg by mouth daily.   . diphenhydrAMINE (BENADRYL) 25 MG tablet Take 25 mg by mouth every 6 (six)  hours as needed.   . donepezil (ARICEPT) 10 MG tablet Take 10 mg by mouth at bedtime.   Marland Kitchen escitalopram (LEXAPRO) 10 MG tablet Take 10 mg by mouth daily.   Marland Kitchen guaiFENesin (MUCINEX) 600 MG 12 hr tablet Take by mouth 2 (two) times daily.   . hydrochlorothiazide (HYDRODIURIL) 25 MG tablet Take 25 mg by mouth daily. 11/08/2015: Not Taking:  Discontinued 10/29/15 by Dr. Brigitte Pulse due to relative hypotension.    . Hydrocortisone (CORTIZONE-10 ECZEMA EX) Apply 1 % topically as needed.   . Memantine HCl ER (NAMENDA XR) 28 MG CP24 Take 28 mg by mouth daily.   . metFORMIN (GLUCOPHAGE) 500 MG tablet Take 500 mg by mouth 2 (two) times daily with a meal.   . Multiple Vitamin (MULTIVITAMIN) capsule Take 1 capsule by mouth daily.   . Omega-3 Krill Oil 1000 MG CAPS Take by mouth daily.   Marland Kitchen telmisartan (MICARDIS) 80 MG tablet Take 80 mg by mouth daily.    No facility-administered encounter medications on file as of 12/15/2016.    Functional Status:   In your present state of health, do you have any difficulty performing the following activities: 12/01/2016  Hearing? N  Comment per patient's spouse Barbara Gill  Vision? N  Comment per patient's spouse Barbara Gill; however, he reports patient "does  not wear" her newly acquired glasses (3 months ago)  Difficulty concentrating or making decisions? Y  Comment per patient's spouse Barbara Gill, patient has had "severe" Alzheimers Disease" for "many years"  Walking or climbing stairs? N  Comment per patient's spouse Barbara Gill  Dressing or bathing? Y  Comment per patient's spouse Barbara Gill, patient requires assistance  Doing errands, shopping? Y  Comment per patient's spouse Barbara Gill, patient can not do without his assistance  Preparing Food and eating ? Y  Comment per patient's spouse Barbara Gill, patient can feed self "at times;" reports he prepares all patient meals  Using the Toilet? Y  Comment per patient's spouse Barbara Gill, patient is now incontinent and wears adult diapers at all times  In the past  six months, have you accidently leaked urine? Y  Comment per patient's spouse Barbara Gill, patient is now incontinent  Do you have problems with loss of bowel control? Y  Comment per patient's spouse Barbara Gill, patient is now incontinent  Managing your Medications? Y  Comment per patient's spouse Barbara Gill, patient medications are managed by him  Managing your Finances? Y  Comment per patient's spouse Barbara Gill, patient can not manage finances  Housekeeping or managing your Housekeeping? Y  Comment per patient's spouse Barbara Gill, patient is unable to do housework  Some recent data might be hidden   Fall/Depression Screening:    Fall Risk  12/01/2016 11/25/2016 01/10/2016  Falls in the past year? Yes Yes No  Comment per patient's spouse Barbara Gill - -  Number falls in past yr: 1 1 -  Comment per patient's spouse Barbara Gill - -  Injury with Fall? No No -  Comment per patient's spouse Barbara Gill - -  Risk for fall due to : Mental status change History of fall(s);Impaired balance/gait;Impaired mobility;Medication side effect;Mental status change;Impaired vision -  Follow up Falls prevention discussed Falls evaluation completed -  Comment discussed with patient's spouse Barbara Gill   [facility] 2/9 Scores 11/25/2016 01/10/2016 11/19/2015 11/08/2015  PHQ - 2 Score - 0 0 0  Exception Documentation Other- indicate reason in comment box - - -  Not completed call completed with spouse - - -   Plan:  Patient will take medications as prescribed and will attend all scheduled provider appointments  Caregiver/ spouse will promptly notify patient's medical providers for any new concerns, issues, problems that arise  Patient's spouse will continue reviewing printed care support resources mailed to him by [facility] CSW   Patient's spouse/ caregiver will develop a list of questions important to him to ask care support resources as he begins interview process  Patient's caregiver/ spouse will begin to make contact with care support resources by  telephone or by visiting in person  I will share notes from today's [place] initial home visit with patient's PCP   [facility] RN CM involvement in patient's care to continue with scheduled telephone outreach in 2 weeks  [facility] CM Care Plan Problem One     Most Recent Value  Care Plan Problem One  Level of care needs/ resources for patient with severe alzheimers who requires total assistance with ADL's, as evidenced by patient's spouse reporting  Role Documenting the Problem One  Care Management Coordinator  Care Plan for Problem One  Active  [facility] Long Term Goal   Over the next 60 days, patient's spouse will verbalize plan for additional resources for patient's care while she remains at home, as evidenced by patient's caregiver reporting during [facility] RN CCM outreach  [place]  Goal Start Date  12/01/16  Interventions for Problem One Long Term Goal  Discussed with patient's caregiver current patient needs and levels of support available (CNA vs. sitter/ personal care assistant),  confirmed that caregiver received printed resource list mailed by [facility] CSW, and reviewed resources with caregiver,  encouraged patient to begin making a list of questions to ask when he begins calling resources for information,  [facility] RN CCM initial home visit completed  [facility] CM Short Term Goal #1   Over the next 14 days, patient will develop a list of questions important to him to ask resource agencies as he begins calling for information about their programs, as evidenced by caregiver reporting during [facility] RN CM outreach  [facility] CM Short Term Goal #1 Start Date  12/15/16  Interventions for Short Term Goal #1  Using teachback method, reviewed with caregiver aspects of patient care important to him, and assisted him with developing a starter-list of questions to ask about as he begins calling/ researching care support agencies and programs,  discussed various levels of care with patient's caregiver  [facility] CM Short Term  Goal #2   Over the next 30 days, patient's caregiver will contact 3 care agencies to inquire about care support options for patient/ caregiver relief, as evidenced by caregiver reporting during [facility] RN CM ouytreach  [facility] CM Short Term Goal #2 Start Date  12/15/16  Interventions for Short Term Goal #2  Discussed several care support programs with patient's caregiver and explained differing levels of care support, i.e., sitter vs. CNA support    [facility] CM Care Plan Problem Two     Most Recent Value  Care Plan Problem Two  Caregiver concern with patient's increased episodes of incontinence, as evidenced by caregiver reporting of same  Role Documenting the Problem Two  Care Management Coordinator  Care Plan for Problem Two  Active  Interventions for Problem Two Long Term Goal   Using teachback method, provides/ reviewed and discussed EMMI educational material on urinary/ fecal incontinence,  discussed caregiver's current strategies for managing patient's increased episodes of incontinence, and introduced concept of bowel/ bladder training for incontinence,  discussed importance of maintaining skin integrity, appropriate skin care with incontinence, and prevention of skin complications in setting of incontinence   [facility] Long Term Goal  Over the next 45 days, patient's caregiver will verbalize plan for managing patient's incontinence, as evidenced by caregiver reporting of same during [facility] RN CCM outreach   [facility] Long Term Goal Start Date  12/15/16     [name], RN, BSN, [employer] Coordinator [facility] Care Management  [phone]

## 2016-12-16 ENCOUNTER — Encounter: Payer: Self-pay | Admitting: *Deleted

## 2016-12-29 ENCOUNTER — Encounter: Payer: Self-pay | Admitting: *Deleted

## 2016-12-29 ENCOUNTER — Other Ambulatory Visit: Payer: Self-pay | Admitting: *Deleted

## 2016-12-29 NOTE — Patient Outreach (Addendum)
[facility]) Care Management [place] Telephone Outreach  12/29/2016  Barbara Gill December 15, 1955 [id number]  Successful telephone outreach to Barbara Gill, spouse/ caregiver (on [facility] CM written consent) of Barbara Gill, 61 y.o. female referred to [place] on PCP referral; [facility] telephonic RN CM completed screening on 11/25/16, and placed referral to [place] CM for further evaluation and assessment of patient/ caregiver needs/ resources.Patient has history including, but not limited to, DM, OSA-previously on CPAP, HLD/ HTN, severe alzheimer's disease/ nonverbal, and anxiety/ depression, including ADD.HIPAA/ identity verified with patient's spouse during phone call today.  Today, Barbara Gill reports that patient is "doing about the same," and he denies that she is in pain or distress of any nature.  Barbara Gill reports that he "has not been as productive" as he would have like to have been in contacting resources/ agencies provided to him during [place] initial home visit, stating time issues around caring for both his mother who is in an ALF and patient.  Caregiver/ spouse Barbara Gill further reports:  -- Has all medicationsand takes as prescribed;spousedenies questions about current medications, denies recent changes to medications.   --Noupcoming provider appointments scheduled, confirms patient received flu shot for this season during last PCP office visit  -- spouse confirms that he has continued reviewing printed list of care support resources from [facility] CSW and that he has continued reviewing list of questions we began developing together during [place] initial home visit and has expanded upon this list "a little bit."  -- reports that he has not yet started the process of contacting agencies, as he has just been "too busy," and states that he will make this a priority soon, as he would like to have additional care in place for patient "by the first of  the year."  I made patient aware of upcoming caregiver event through [place] and again provided him the number for [employer]; encouraged caregiver to begin process of contacting agencies soon, and he stated he would do  -- has started slowly attempting to take steps to introduce bowel/ bladder training, as we discussed during initial [place] initial home visit; reports that patient has responded well to this, and has decreased her episodes of incontinence "about 50%."  Reports patient's skin continues "looking good without issues," and is able to verbalize good general practices for skin care with times of incontinence.  Patient's spousedenies further issues, concerns, or problems today. Iconfirmedthat he hasmy direct phone number, the main [facility] CM office phone number, and the [facility] CM 24-hour nurse advice phone number should issues arise prior to next scheduled [place] outreach by phone in 2 weeks.  Plan:  Patient will take medications as prescribed and will attend all scheduled provider appointments  Caregiver/ spouse will promptly notify patient's medical providers for any new concerns, issues, problems that arise  Patient's spouse will continue reviewing printed care support resources mailed to him by [facility] CSW   Patient's spouse/ caregiver will continue developing list of questions important to him to ask care support resources as he begins interview process  Patient's caregiver/ spouse will begin to make contact with care support resources by telephone or by visiting in person  [place] RN CM involvement in patient's care to continue with scheduled telephone outreach in 2 weeks  [facility] CM Care Plan Problem One     Most Recent Value  Care Plan Problem One  Level of care needs/  resources for patient with severe alzheimers who requires total assistance with ADL's, as evidenced by patient's spouse reporting  Role Documenting the Problem One  Care  Management [place] for Problem One  Active  THN Long Term Goal   Over the next 60 days, patient's spouse will verbalize plan for additional resources for patient's care while she remains at home, as evidenced by patient's caregiver reporting during [facility] RN CCM outreach  [place] Term Goal Start Date  12/01/16  Interventions for Problem One Long Term Goal  Discussed with patient's caregiver progress made to address stated need for additional level of care support and encouraged him to begin this process,  confirmed that caregiver has phone numbers for resources and made him aware of upcoming caregiver event through [employer],  encouraged caregiver to continue expanding list of questions to ask when he begins calling resources for information  THN CM Short Term Goal #1   Over the next 14 days, patient will develop a list of questions important to him to ask resource agencies as he begins calling for information about their programs, as evidenced by caregiver reporting during [facility] RN CM outreach  [facility] CM Short Term Goal #1 Start Date  12/15/16  [facility] CM Short Term Goal #1 Met Date  12/29/16  THN CM Short Term Goal #2   Over the next 30 days, patient's caregiver will contact 3 care agencies to inquire about care support options for patient/ caregiver relief, as evidenced by caregiver reporting during [facility] RN CM ouytreach  THN CM Short Term Goal #2 Start Date  12/15/16  Interventions for Short Term Goal #2  Encouraged caregiver to begin this process and confirmed that he has list of agencies along with their phone numbers    [facility] CM Care Plan Problem Two     Most Recent Value  Care Plan Problem Two  Caregiver concern with patient's increased episodes of incontinence, as evidenced by caregiver reporting of same  Role Documenting the Problem Two  Care Management Coordinator  Care Plan for Problem Two  Active  Interventions for Problem Two Long Term Goal   Discussed caregivers progress in  beginning bowel/ bladder training, and confirmed that caregiver is able to verbalize appopriate skin care measures and report of concerning signs/ symptoms to report if they arise  THN Long Term Goal  Over the next 45 days, patient's caregiver will verbalize plan for managing patient's incontinence, as evidenced by caregiver reporting of same during [facility] RN CCM outreach   [place] Term Goal Start Date  12/15/16     [name], RN, BSN, [employer] Coordinator [facility] Care Management  [phone]

## 2017-01-11 ENCOUNTER — Encounter: Payer: Self-pay | Admitting: *Deleted

## 2017-01-11 ENCOUNTER — Other Ambulatory Visit: Payer: Self-pay | Admitting: *Deleted

## 2017-01-11 NOTE — Patient Outreach (Signed)
[facility]) Care Management [place] Telephone Outreach 01/11/2017  [name] 1955/08/28 [id number]  Successful telephone outreach to Barbara Gill, spouse/ caregiver (on [facility] CM written consent) of Barbara Gill, 61 y.o.femalereferred to [place] on PCP referral; [facility] telephonic RN CM completed screening on 11/25/16, and placed referral to [place] CM for further evaluation and assessment of patient/ caregiver needs/ resources.Patient has history including, but not limited to, DM, OSA-previouslyon CPAP, HLD/ HTN, severe alzheimer's disease/ nonverbal, and anxiety/ depression, including ADD.HIPAA/ identity verifiedwith patient's spouse during phone call today.  Today, [name] again reports that patient is "doing about the same," and he denies that she is in pain or distress of any nature.  [name] reports that he "has contacted [employer]" and is planning to go to [employer] in person" after the first of the year."  Reports that thus far, he has not yet contacted other agencies/ resources, citing time issues around caring for both his mother who is in an ALF and patient, as well as recent snow and holiday considerations.  Caregiver/ spouse [name] further reports:  -- Has all medicationsand takes as prescribed;spousedenies questions/ concerns about current medications, denies recent changes to medications.   --Noupcoming provider appointments scheduled; states will call patient's PCP promptly if issues arise  -- spouse confirms that he hascontinued reviewing printed list of care support resources from[facility]N CSW and that he has continued reviewing/ expanding list of questions we began developing together during [place] initial home visit   -- reports that he has not contacted as many agencies as he would have liked, as he has just been "too busy," and states that he will make this a priority after the first of the new  year.  -- reports he has continued with strategies around bowel/ bladder training, reports patient "has good days and bad days," and he admits that on same days he is more consistent than others.  Continues to deny concerns around patient's skin integrity; reports "no changes."  Patient's spousedenies further issues, concerns, or problems today. Iconfirmedthat he hasmy direct phone number, the main THN CM office phone number, and the [facility] CM 24-hour nurse advice phone number should issues arise prior to next scheduled [place] outreach by phone in 2 weeks, possibly for case closure, which spouse agrees with.  Plan:  Patient will take medications as prescribed and will attend all scheduled provider appointments  Caregiver/ spouse will promptly notify patient's medical providers for any new concerns, issues, problems that arise  Patient's spouse will continuereviewing printed care support resources mailed to him by [name] CSW  Patient's spouse/ caregiver will continue developing list of questions important to him to ask care support resourcesas he begins interview process  Patient's caregiver/ spouse will begin to make contact with care support resources by telephone or by visiting in person  [place] RN CM involvement in patient's care to continue withscheduled telephone outreachin 2 weeks, possibly for case closure  [facility] CM Care Plan Problem One     Most Recent Value  Care Plan Problem One  Level of care needs/ resources for patient with severe alzheimers who requires total assistance with ADL's, as evidenced by patient's spouse reporting  Role Documenting the Problem One  Care Management Coordinator  Care Plan for Problem One  Active  THN Long Term Goal   Over the next 60 days, patient's spouse will verbalize plan for additional resources for patient's care while she remains at  home, as evidenced by patient's caregiver reporting during [place] outreach  [name]  Term Goal Start Date  12/01/16  Interventions for Problem One Long Term Goal  Discussed with patient's caregiver progress made to address stated need for additional level of care support,  discussed his outreach attempts to resources, and encouraged him to continuing makeing efforts to contact resources as provided to him by [facility] CSW/ RN CM,  confirmed that caregiver has completed list of questions to ask when he calls resources for information  THN CM Short Term Goal #2   Over the next 30 days, patient's caregiver will contact 3 care agencies to inquire about care support options for patient/ caregiver relief, as evidenced by caregiver reporting during [facility] RN CM ouytreach  THN CM Short Term Goal #2 Start Date  12/15/16  Interventions for Short Term Goal #2  Encouraged caregiver to continue this process and re-confirmed that he has list of agencies and phone numbers    [name] CM Care Plan Problem Two     Most Recent Value  Care Plan Problem Two  Caregiver concern with patient's increased episodes of incontinence, as evidenced by caregiver reporting of same  Role Documenting the Problem Two  Care Management Coordinator  Care Plan for Problem Two  Active  Interventions for Problem Two Long Term Goal   Discussed caregivers progress in maintaing bowel/ bladder training, and confirmed that patient has concerning signs/ symptoms of skin breakdown/ changes.  Encouraged caregiver to continue efforts of bowel/ bladder training in consistent manner  THN Long Term Goal  Over the next 45 days, patient's caregiver will verbalize plan for managing patient's incontinence, as evidenced by caregiver reporting of same during [place] outreach   [place] Term Goal Start Date  12/15/16     [name], RN, BSN, [place] Care Management  [phone]

## 2017-01-28 ENCOUNTER — Ambulatory Visit: Payer: Self-pay | Admitting: *Deleted

## 2017-01-29 ENCOUNTER — Other Ambulatory Visit: Payer: Self-pay | Admitting: *Deleted

## 2017-01-29 ENCOUNTER — Encounter: Payer: Self-pay | Admitting: *Deleted

## 2017-01-29 NOTE — Patient Outreach (Signed)
[facility]) Care Management [place] Telephone Outreach  01/29/2017  Barbara Gill January 13, 1956 [id number]  Unsuccessful telephone outreach to Barbara Gill, spouse/ caregiver (on [facility] CM written consent) Barbara Gill, [place] y.o.femalereferred to [place] on PCP referral; [facility] telephonic RN CM completed screening on 11/25/16, and placed referral to [place] CM for further evaluation and assessment of patient/ caregiver needs/ resources.Patient has history including, but not limited to, DM, OSA-previouslyon CPAP, HLD/ HTN, severe alzheimer's disease/ nonverbal, and anxiety/ depression, including ADD.  HIPAA compliant voice mail message left for patient, requesting return call back.  Plan:  Will re-attempt [place] telephone outreach again next week if I do not hear back from patient's caregiver first.  [name], RN, BSN, [place] Coordinator [facility] Care Management  [phone]

## 2017-01-29 NOTE — Patient Outreach (Signed)
[facility]) Care Management [place] Telephone [place] Coordination  01/29/2017  Barbara Gill Oct 17, 1955 [id number]  Successful telephone outreach to Barbara Glasgow, NP with "[employer]" [phone]) re: Barbara Gill, Barbara Gill y.o.femalereferred to [place] on PCP referral; [facility] telephonic RN CM completed screening on 11/25/16, and placed referral to [place] CM for further evaluation and assessment of patient/ caregiver needs/ resources.Patient has history including, but not limited to, DM, OSA-previouslyon CPAP, HLD/ HTN, severe alzheimer's disease/ nonverbal, and anxiety/ depression, including ADD.  Was notified by [facility] CMA that new referral for [facility] CCM had been received from [place]; [place] CM currently involved in patient's care.  Call placed today to number listed on referral to update on [place] CM status.  Explained to Barbara Gill that [place] is currently involved in patient's care, and may likely be closing patient's case soon.  Barbara Gill reported that she was unaware of patient's current active involvement with [facility] CM program, and advised to disregard referral which was sent by [employer] on 01/29/16.  Barbara Rack, RN, BSN, [employer] [facility] Care Management  [phone]

## 2017-02-03 ENCOUNTER — Encounter: Payer: Self-pay | Admitting: *Deleted

## 2017-02-03 ENCOUNTER — Other Ambulatory Visit: Payer: Self-pay | Admitting: *Deleted

## 2017-02-03 NOTE — Patient Outreach (Signed)
[facility]) Care Management [place] Telephone Outreach/ Case Closure  02/03/2017  WINDI TORO 09/26/1955 [id number]  Successful telephone outreach to [name], spouse/ caregiver (on [facility] CM written consent) ofKimberly Gill, [place] y.o.femalereferred to [place] on PCP referral; [facility] telephonic RN CM completed screening on 11/25/16, and placed referral to [place] CM for further evaluation and assessment of patient/ caregiver needs/ resources.Patient has history including, but not limited to, DM, OSA-previouslyon CPAP, HLD/ HTN, severe alzheimer's disease/ nonverbal, and anxiety/ depression, including ADD.HIPAA/ identity verifiedwith patient's spouseduring phone call today.  Today, [name] reports that patient is "doing pretty good; about the same," and he denies that she is in pain or distress of any nature. [name] reports that he "has contacted friends who are in the same situation" and has collaborated to find agencies that he may be interested in for in-home assistance with patient; states that he has not made "a lot" of progress due to recent holidays, but plans to "have something in place very soon."  Reports he has decided that if he ever "has to put her in a home," that he has chosen [employer], acknowledging that it will "be quite awhile" before patient needs placement outside of their home.  [name] also reports he has continued with strategies around bowel/ bladder training, reports today that he believes "this is much better" and that patient "has much less incontinence when we follow a regular schedule."  Continues to deny concerns around patient's skin integrity; again reports "no changes."  Patient's spousedenies further issues, concerns, or problems today, and he denies further care coordination needs. Spouse/ caregiver agrees with [facility] CCM case closure.  Iconfirmedthat he hasmy direct phone number, the main [facility] CM office  phone number, and the [facility] CM 24-hour nurse advice phone number should issues arise in the future.  Plan:  Will close [place], as patient's spouse/ caregiver denies further care coordination needs, and will make patient's PCP and [facility] CMA aware of same.  [name], RN, BSN, [employer] [facility] Care Management  [phone]

## 2017-05-27 DIAGNOSIS — E1165 Type 2 diabetes mellitus with hyperglycemia: Secondary | ICD-10-CM | POA: Diagnosis not present

## 2017-05-27 DIAGNOSIS — E7849 Other hyperlipidemia: Secondary | ICD-10-CM | POA: Diagnosis not present

## 2017-06-03 DIAGNOSIS — E1169 Type 2 diabetes mellitus with other specified complication: Secondary | ICD-10-CM | POA: Diagnosis not present

## 2017-06-03 DIAGNOSIS — E7849 Other hyperlipidemia: Secondary | ICD-10-CM | POA: Diagnosis not present

## 2017-06-03 DIAGNOSIS — I10 Essential (primary) hypertension: Secondary | ICD-10-CM | POA: Diagnosis not present

## 2017-06-03 DIAGNOSIS — G4733 Obstructive sleep apnea (adult) (pediatric): Secondary | ICD-10-CM | POA: Diagnosis not present

## 2017-06-03 DIAGNOSIS — F325 Major depressive disorder, single episode, in full remission: Secondary | ICD-10-CM | POA: Diagnosis not present

## 2017-06-03 DIAGNOSIS — G309 Alzheimer's disease, unspecified: Secondary | ICD-10-CM | POA: Diagnosis not present

## 2017-06-03 DIAGNOSIS — Z6836 Body mass index (BMI) 36.0-36.9, adult: Secondary | ICD-10-CM | POA: Diagnosis not present

## 2017-06-03 DIAGNOSIS — R4701 Aphasia: Secondary | ICD-10-CM | POA: Diagnosis not present

## 2017-12-03 DIAGNOSIS — G308 Other Alzheimer's disease: Secondary | ICD-10-CM | POA: Diagnosis not present

## 2017-12-03 DIAGNOSIS — E7849 Other hyperlipidemia: Secondary | ICD-10-CM | POA: Diagnosis not present

## 2017-12-03 DIAGNOSIS — R4701 Aphasia: Secondary | ICD-10-CM | POA: Diagnosis not present

## 2017-12-03 DIAGNOSIS — F325 Major depressive disorder, single episode, in full remission: Secondary | ICD-10-CM | POA: Diagnosis not present

## 2017-12-03 DIAGNOSIS — Z23 Encounter for immunization: Secondary | ICD-10-CM | POA: Diagnosis not present

## 2017-12-03 DIAGNOSIS — I10 Essential (primary) hypertension: Secondary | ICD-10-CM | POA: Diagnosis not present

## 2017-12-03 DIAGNOSIS — E1169 Type 2 diabetes mellitus with other specified complication: Secondary | ICD-10-CM | POA: Diagnosis not present

## 2018-04-12 ENCOUNTER — Other Ambulatory Visit: Payer: Self-pay

## 2018-04-12 ENCOUNTER — Emergency Department (HOSPITAL_BASED_OUTPATIENT_CLINIC_OR_DEPARTMENT_OTHER)
Admission: EM | Admit: 2018-04-12 | Discharge: 2018-04-12 | Disposition: A | Discharge status: Home / Self Care | Payer: PPO | Source: Home / Self Care | Attending: Emergency Medicine | Admitting: Emergency Medicine

## 2018-04-12 ENCOUNTER — Emergency Department (HOSPITAL_COMMUNITY)
Admission: EM | Admit: 2018-04-12 | Discharge: 2018-04-12 | Disposition: A | Discharge status: Home / Self Care | Payer: PPO | Attending: Emergency Medicine | Admitting: Emergency Medicine

## 2018-04-12 ENCOUNTER — Encounter (HOSPITAL_COMMUNITY): Payer: Self-pay | Admitting: Emergency Medicine

## 2018-04-12 DIAGNOSIS — G309 Alzheimer's disease, unspecified: Secondary | ICD-10-CM | POA: Insufficient documentation

## 2018-04-12 DIAGNOSIS — Z79899 Other long term (current) drug therapy: Secondary | ICD-10-CM | POA: Diagnosis not present

## 2018-04-12 DIAGNOSIS — O24435 Gestational diabetes mellitus in puerperium, controlled by oral hypoglycemic drugs: Secondary | ICD-10-CM | POA: Insufficient documentation

## 2018-04-12 DIAGNOSIS — E119 Type 2 diabetes mellitus without complications: Secondary | ICD-10-CM | POA: Diagnosis not present

## 2018-04-12 DIAGNOSIS — I959 Hypotension, unspecified: Secondary | ICD-10-CM | POA: Diagnosis not present

## 2018-04-12 DIAGNOSIS — R509 Fever, unspecified: Secondary | ICD-10-CM | POA: Diagnosis not present

## 2018-04-12 DIAGNOSIS — F17200 Nicotine dependence, unspecified, uncomplicated: Secondary | ICD-10-CM | POA: Insufficient documentation

## 2018-04-12 DIAGNOSIS — M7989 Other specified soft tissue disorders: Secondary | ICD-10-CM

## 2018-04-12 DIAGNOSIS — I10 Essential (primary) hypertension: Secondary | ICD-10-CM | POA: Insufficient documentation

## 2018-04-12 DIAGNOSIS — R609 Edema, unspecified: Secondary | ICD-10-CM | POA: Diagnosis not present

## 2018-04-12 DIAGNOSIS — R2242 Localized swelling, mass and lump, left lower limb: Secondary | ICD-10-CM | POA: Diagnosis not present

## 2018-04-12 DIAGNOSIS — Z7982 Long term (current) use of aspirin: Secondary | ICD-10-CM | POA: Insufficient documentation

## 2018-04-12 MED ORDER — CEPHALEXIN 500 MG PO CAPS: 500.0000 mg | ORAL_CAPSULE | Freq: Three times a day (TID) | ORAL | 0 refills | Status: DC

## 2018-04-12 NOTE — ED Notes (Signed)
Patient verbalizes understanding of discharge instructions. Opportunity for questioning and answers were provided. [place]band removed by staff, pt discharged from ED via wheelchair to home.  

## 2018-04-12 NOTE — ED Triage Notes (Signed)
Pt arrives to ED by [facility] for c/o of left leg swelling. Pt has dementia and is nonverbal just laughs at questions.

## 2018-04-12 NOTE — Discharge Instructions (Signed)
It was my pleasure taking care of you today!  ° °Please call your primary care doctor today or tomorrow to schedule a follow up appointment.  ° °Return to ER for new or worsening symptoms, any additional concerns.  °

## 2018-04-12 NOTE — Progress Notes (Signed)
Left lower extremity venous duplex has been completed. Preliminary results can be found in CV Proc through chart review.  Results were given to [facility] PA.  04/12/18 2:55 PM Carlos Levering RVT

## 2018-04-12 NOTE — ED Provider Notes (Signed)
[facility] EMERGENCY DEPARTMENT Provider Note   CSN: [id number] Arrival date & time: 04/12/18  1340    History   Chief Complaint Chief Complaint  Patient presents with   Leg Swelling    HPI Barbara Gill is a 63 y.o. female.     The history is provided by the spouse and medical records. No language interpreter was used.   Barbara Gill is a 63 y.o. female  with a PMH of hypertension, hyperlipidemia, dementia who presents to the Emergency Department with husband for concerns of left lower extremity swelling.  He first noticed this yesterday.  He called her primary care doctor who is out for the week, but the office told him to bring her to the ER for evaluation.  She has been acting herself and husband denies any decreased appetite or change in behavior.  Has not noticed any fever.  He does report that both legs will intermittently get red, the left more so than the right.  She sits in her recliner for the majority of the day.  Has been gets her up to clean her a few times a day and tries to get her up and moving around at least once, but essentially she is sitting the majority of the day.  Level V caveat applies 2/2 dementia   Past Medical History:  Diagnosis Date   Alzheimer's disease ([place])    High cholesterol    Hyperlipemia    Hypertension     Patient Active Problem List   Diagnosis Date Noted   Diabetes ([place]) 11/08/2015   Alzheimer's disease ([place]) 11/08/2015   OSA (obstructive sleep apnea) 11/08/2015   Nicotine dependence 11/08/2015   Hyperlipemia 11/08/2015   ADD (attention deficit disorder) 11/08/2015   Generalized anxiety disorder 11/08/2015   Depression 11/08/2015    History reviewed. No pertinent surgical history.   OB History   No obstetric history on file.      Home Medications    Prior to Admission medications   Medication Sig Start Date End Date Taking? Authorizing Provider  aspirin 81 MG tablet Take 81 mg  by mouth daily.    [provider]  atorvastatin (LIPITOR) 20 MG tablet Take 20 mg by mouth at bedtime.    [provider]  Calcium-Vit D3-Phytosterols ([place]) [phone] MG-UNIT-MG TABS Take by mouth daily.    [provider]  cephALEXin (KEFLEX) 500 MG capsule Take 1 capsule (500 mg total) by mouth 3 (three) times daily. 04/12/18   Joeanne Robicheaux, Ozella Almond, PA-C  cetirizine (ZYRTEC) 10 MG tablet Take 10 mg by mouth daily.    [provider]  diphenhydrAMINE (BENADRYL) 25 MG tablet Take 25 mg by mouth every 6 (six) hours as needed.    [provider]  divalproex (DEPAKOTE ER) 250 MG 24 hr tablet Take 250 mg by mouth 2 (two) times daily.    Marton Redwood, MD  donepezil (ARICEPT) 10 MG tablet Take 10 mg by mouth at bedtime.    [provider]  escitalopram (LEXAPRO) 10 MG tablet Take 10 mg by mouth daily.    [provider]  guaiFENesin (MUCINEX) 600 MG 12 hr tablet Take by mouth 2 (two) times daily.    [provider]  hydrochlorothiazide (HYDRODIURIL) 25 MG tablet Take 25 mg by mouth daily.    [provider]  Hydrocortisone (CORTIZONE-10 ECZEMA EX) Apply 1 % topically as needed.    [provider]  Memantine HCl ER (  NAMENDA XR) 28 MG CP24 Take 28 mg by mouth daily.    [provider]  metFORMIN (GLUCOPHAGE) 500 MG tablet Take 500 mg by mouth 2 (two) times daily with a meal.    Marton Redwood, MD  Multiple Vitamin (MULTIVITAMIN) capsule Take 1 capsule by mouth daily.    [provider]  Omega-3 Krill Oil 1000 MG CAPS Take by mouth daily.    [provider]  telmisartan (MICARDIS) 80 MG tablet Take 80 mg by mouth daily.    [provider]    Family History Family History  Problem Relation Age of Onset   Cancer Father    Hypothyroidism Father    Hypertension Mother        borderline   Diabetes Mother    Breast cancer Mother     Social  History Social History   Tobacco Use   Smoking status: Current Every Day Smoker   Smokeless tobacco: Never Used   Tobacco comment: 1 pack weekly   Substance Use Topics   Alcohol use: Yes    Alcohol/week: 0.0 standard drinks    Comment: occas.,once every two months   Drug use: No     Allergies   Seasonal ic [cholestatin] and Poison ivy extract [poison ivy extract]   Review of Systems Review of Systems  Unable to perform ROS: Dementia  Cardiovascular: Positive for leg swelling.     Physical Exam Updated Vital Signs BP (!) 109/53 (BP Location: Left Arm)    Pulse 63    Temp 98.2 F (36.8 C) (Oral)    Resp 12    Ht 5\' 3"  (1.6 m)    Wt 88.5 kg    SpO2 100%    BMI 34.54 kg/m   Physical Exam Vitals signs and nursing note reviewed.  Constitutional:      General: She is not in acute distress.    Appearance: She is well-developed.  HENT:     Head: Normocephalic and atraumatic.  Neck:     Musculoskeletal: Neck supple.  Cardiovascular:     Rate and Rhythm: Normal rate and regular rhythm.     Heart sounds: Normal heart sounds. No murmur.  Pulmonary:     Effort: Pulmonary effort is normal. No respiratory distress.     Breath sounds: Normal breath sounds.  Abdominal:     General: There is no distension.     Palpations: Abdomen is soft.     Tenderness: There is no abdominal tenderness.  Musculoskeletal:     Right lower leg: No edema.     Left lower leg: Edema present.  Skin:    General: Skin is warm and dry.  Neurological:     Mental Status: She is alert.      ED Treatments / Results  Labs (all labs ordered are listed, but only abnormal results are displayed) Labs Reviewed - No data to display  EKG None  Radiology Vas [place] Lower Extremity Venous (dvt) (only [facility])  Result Date: 04/12/2018  Lower Venous Study Indications: Swelling.  Limitations: Patient movement, patient cooperation and poor ultrasound/tissue interface. Performing Technologist: Oliver Hum RVT  Examination Guidelines: A complete evaluation includes B-mode imaging, spectral Doppler, color Doppler, and power Doppler as needed of all accessible portions of each vessel. Bilateral testing is considered an integral part of a complete examination. Limited examinations for reoccurring indications may be performed as noted.  Right Venous Findings: +---+---------------+---------+-----------+----------+-------+      Compressibility Phasicity Spontaneity Properties Summary  +---+---------------+---------+-----------+----------+-------+  CFV Full            Yes       Yes                             +---+---------------+---------+-----------+----------+-------+  Left Venous Findings: +---------+---------------+---------+-----------+----------+-------+            Compressibility Phasicity Spontaneity Properties Summary  +---------+---------------+---------+-----------+----------+-------+  CFV       Full            Yes       Yes                             +---------+---------------+---------+-----------+----------+-------+  SFJ       Full                                                      +---------+---------------+---------+-----------+----------+-------+  FV Prox   Full                                                      +---------+---------------+---------+-----------+----------+-------+  FV Mid    Full                                                      +---------+---------------+---------+-----------+----------+-------+  FV Distal Full                                                      +---------+---------------+---------+-----------+----------+-------+  PFV       Full                                                      +---------+---------------+---------+-----------+----------+-------+  POP       Full            Yes       Yes                             +---------+---------------+---------+-----------+----------+-------+  PTV       Full                                                       +---------+---------------+---------+-----------+----------+-------+  PERO      Full                                                      +---------+---------------+---------+-----------+----------+-------+  Summary: Right: No evidence of common femoral vein obstruction. Left: There is no evidence of deep vein thrombosis in the lower extremity. No cystic structure found in the popliteal fossa.  *See table(s) above for measurements and observations.    Preliminary     Procedures Procedures (including critical care time)  Medications Ordered in ED Medications - No data to display   Initial Impression / Assessment and Plan / ED Course  I have reviewed the triage vital signs and the nursing notes.  Pertinent labs & imaging results that were available during my care of the patient were reviewed by me and considered in my medical decision making (see chart for details).       Barbara Gill is a 63 y.o. female who presents to ED for left lower extremity swelling first noticed by husband yesterday. NVI on exam. Does have pitting edema to the LLE with no edema to the RLE. DVT study is negative. Husband reports that patient sits with legs crossed and the left leg down for the majority of the day. ? If position could be cause of unilateral edema. Mild erythema overlying the lower leg - this does not appear infectious to me at this point. Discussed this with husband. Will give rx for keflex if redness worsens. He agrees with this plan. PCP follow up within 1 week for recheck and further care. Return precautions discussed and all questions answered.   Patient discussed with Dr. Eulis Foster who agrees with treatment plan.    Final Clinical Impressions(s) / ED Diagnoses   Final diagnoses:  Leg swelling     Rhian Asebedo, Ozella Almond, PA-C 04/12/18 1519    Daleen Bo, MD 04/12/18 [phone]

## 2018-05-02 DIAGNOSIS — G309 Alzheimer's disease, unspecified: Secondary | ICD-10-CM | POA: Diagnosis not present

## 2018-05-02 DIAGNOSIS — L03116 Cellulitis of left lower limb: Secondary | ICD-10-CM | POA: Diagnosis not present

## 2018-05-02 DIAGNOSIS — R600 Localized edema: Secondary | ICD-10-CM | POA: Diagnosis not present

## 2018-06-02 DIAGNOSIS — Z7689 Persons encountering health services in other specified circumstances: Secondary | ICD-10-CM | POA: Diagnosis not present

## 2018-07-04 DIAGNOSIS — M6281 Muscle weakness (generalized): Secondary | ICD-10-CM | POA: Diagnosis not present

## 2018-07-04 DIAGNOSIS — G309 Alzheimer's disease, unspecified: Secondary | ICD-10-CM | POA: Diagnosis not present

## 2018-07-04 DIAGNOSIS — F29 Unspecified psychosis not due to a substance or known physiological condition: Secondary | ICD-10-CM | POA: Diagnosis not present

## 2018-07-04 DIAGNOSIS — I10 Essential (primary) hypertension: Secondary | ICD-10-CM | POA: Diagnosis not present

## 2018-09-12 DIAGNOSIS — Z20828 Contact with and (suspected) exposure to other viral communicable diseases: Secondary | ICD-10-CM | POA: Diagnosis not present

## 2018-10-03 ENCOUNTER — Encounter: Payer: Self-pay | Admitting: Gastroenterology

## 2018-10-06 DIAGNOSIS — Z20828 Contact with and (suspected) exposure to other viral communicable diseases: Secondary | ICD-10-CM | POA: Diagnosis not present

## 2018-11-17 DIAGNOSIS — Z20828 Contact with and (suspected) exposure to other viral communicable diseases: Secondary | ICD-10-CM | POA: Diagnosis not present

## 2018-11-18 DIAGNOSIS — Z20828 Contact with and (suspected) exposure to other viral communicable diseases: Secondary | ICD-10-CM | POA: Diagnosis not present

## 2019-01-01 ENCOUNTER — Encounter (HOSPITAL_COMMUNITY): Payer: Self-pay

## 2019-01-01 ENCOUNTER — Inpatient Hospital Stay (HOSPITAL_COMMUNITY)
Admission: EM | Admit: 2019-01-01 | Discharge: 2019-01-13 | DRG: 177 | Disposition: A | Discharge status: Skilled Nursing Facility | Payer: PPO | Attending: Family Medicine | Admitting: Family Medicine

## 2019-01-01 ENCOUNTER — Other Ambulatory Visit: Payer: Self-pay

## 2019-01-01 ENCOUNTER — Emergency Department (HOSPITAL_COMMUNITY): Payer: PPO

## 2019-01-01 DIAGNOSIS — R4182 Altered mental status, unspecified: Secondary | ICD-10-CM | POA: Diagnosis not present

## 2019-01-01 DIAGNOSIS — R918 Other nonspecific abnormal finding of lung field: Secondary | ICD-10-CM | POA: Diagnosis not present

## 2019-01-01 DIAGNOSIS — F329 Major depressive disorder, single episode, unspecified: Secondary | ICD-10-CM | POA: Diagnosis not present

## 2019-01-01 DIAGNOSIS — E119 Type 2 diabetes mellitus without complications: Secondary | ICD-10-CM | POA: Diagnosis not present

## 2019-01-01 DIAGNOSIS — E785 Hyperlipidemia, unspecified: Secondary | ICD-10-CM | POA: Diagnosis present

## 2019-01-01 DIAGNOSIS — R41841 Cognitive communication deficit: Secondary | ICD-10-CM | POA: Diagnosis not present

## 2019-01-01 DIAGNOSIS — M25552 Pain in left hip: Secondary | ICD-10-CM | POA: Diagnosis not present

## 2019-01-01 DIAGNOSIS — F319 Bipolar disorder, unspecified: Secondary | ICD-10-CM | POA: Diagnosis present

## 2019-01-01 DIAGNOSIS — G309 Alzheimer's disease, unspecified: Secondary | ICD-10-CM | POA: Diagnosis present

## 2019-01-01 DIAGNOSIS — D6959 Other secondary thrombocytopenia: Secondary | ICD-10-CM | POA: Diagnosis present

## 2019-01-01 DIAGNOSIS — F411 Generalized anxiety disorder: Secondary | ICD-10-CM | POA: Diagnosis present

## 2019-01-01 DIAGNOSIS — J302 Other seasonal allergic rhinitis: Secondary | ICD-10-CM | POA: Diagnosis present

## 2019-01-01 DIAGNOSIS — J1289 Other viral pneumonia: Secondary | ICD-10-CM | POA: Diagnosis not present

## 2019-01-01 DIAGNOSIS — F909 Attention-deficit hyperactivity disorder, unspecified type: Secondary | ICD-10-CM | POA: Diagnosis present

## 2019-01-01 DIAGNOSIS — R4701 Aphasia: Secondary | ICD-10-CM | POA: Diagnosis present

## 2019-01-01 DIAGNOSIS — Z833 Family history of diabetes mellitus: Secondary | ICD-10-CM

## 2019-01-01 DIAGNOSIS — S79912A Unspecified injury of left hip, initial encounter: Secondary | ICD-10-CM | POA: Diagnosis not present

## 2019-01-01 DIAGNOSIS — R0902 Hypoxemia: Secondary | ICD-10-CM

## 2019-01-01 DIAGNOSIS — Z8249 Family history of ischemic heart disease and other diseases of the circulatory system: Secondary | ICD-10-CM

## 2019-01-01 DIAGNOSIS — J9601 Acute respiratory failure with hypoxia: Secondary | ICD-10-CM | POA: Diagnosis not present

## 2019-01-01 DIAGNOSIS — R279 Unspecified lack of coordination: Secondary | ICD-10-CM | POA: Diagnosis not present

## 2019-01-01 DIAGNOSIS — R509 Fever, unspecified: Secondary | ICD-10-CM

## 2019-01-01 DIAGNOSIS — N390 Urinary tract infection, site not specified: Secondary | ICD-10-CM | POA: Diagnosis not present

## 2019-01-01 DIAGNOSIS — Z23 Encounter for immunization: Secondary | ICD-10-CM

## 2019-01-01 DIAGNOSIS — B9622 Other specified Shiga toxin-producing Escherichia coli [E. coli] (STEC) as the cause of diseases classified elsewhere: Secondary | ICD-10-CM | POA: Diagnosis not present

## 2019-01-01 DIAGNOSIS — G9340 Encephalopathy, unspecified: Secondary | ICD-10-CM

## 2019-01-01 DIAGNOSIS — G9341 Metabolic encephalopathy: Secondary | ICD-10-CM | POA: Diagnosis present

## 2019-01-01 DIAGNOSIS — D72819 Decreased white blood cell count, unspecified: Secondary | ICD-10-CM | POA: Diagnosis present

## 2019-01-01 DIAGNOSIS — R5381 Other malaise: Secondary | ICD-10-CM | POA: Diagnosis present

## 2019-01-01 DIAGNOSIS — I10 Essential (primary) hypertension: Secondary | ICD-10-CM | POA: Diagnosis present

## 2019-01-01 DIAGNOSIS — F0280 Dementia in other diseases classified elsewhere without behavioral disturbance: Secondary | ICD-10-CM | POA: Diagnosis present

## 2019-01-01 DIAGNOSIS — Z888 Allergy status to other drugs, medicaments and biological substances status: Secondary | ICD-10-CM

## 2019-01-01 DIAGNOSIS — G4733 Obstructive sleep apnea (adult) (pediatric): Secondary | ICD-10-CM | POA: Diagnosis present

## 2019-01-01 DIAGNOSIS — I959 Hypotension, unspecified: Secondary | ICD-10-CM | POA: Diagnosis present

## 2019-01-01 DIAGNOSIS — R293 Abnormal posture: Secondary | ICD-10-CM | POA: Diagnosis not present

## 2019-01-01 DIAGNOSIS — R404 Transient alteration of awareness: Secondary | ICD-10-CM | POA: Diagnosis not present

## 2019-01-01 DIAGNOSIS — F17210 Nicotine dependence, cigarettes, uncomplicated: Secondary | ICD-10-CM | POA: Diagnosis present

## 2019-01-01 DIAGNOSIS — E7800 Pure hypercholesterolemia, unspecified: Secondary | ICD-10-CM | POA: Diagnosis present

## 2019-01-01 DIAGNOSIS — Z79899 Other long term (current) drug therapy: Secondary | ICD-10-CM

## 2019-01-01 DIAGNOSIS — U071 COVID-19: Principal | ICD-10-CM

## 2019-01-01 DIAGNOSIS — M1612 Unilateral primary osteoarthritis, left hip: Secondary | ICD-10-CM | POA: Diagnosis not present

## 2019-01-01 DIAGNOSIS — R001 Bradycardia, unspecified: Secondary | ICD-10-CM

## 2019-01-01 DIAGNOSIS — R1312 Dysphagia, oropharyngeal phase: Secondary | ICD-10-CM | POA: Diagnosis not present

## 2019-01-01 DIAGNOSIS — R52 Pain, unspecified: Secondary | ICD-10-CM

## 2019-01-01 DIAGNOSIS — R140 Abdominal distension (gaseous): Secondary | ICD-10-CM | POA: Diagnosis not present

## 2019-01-01 DIAGNOSIS — R748 Abnormal levels of other serum enzymes: Secondary | ICD-10-CM | POA: Diagnosis present

## 2019-01-01 DIAGNOSIS — Z743 Need for continuous supervision: Secondary | ICD-10-CM | POA: Diagnosis not present

## 2019-01-01 DIAGNOSIS — R410 Disorientation, unspecified: Secondary | ICD-10-CM | POA: Diagnosis not present

## 2019-01-01 DIAGNOSIS — E876 Hypokalemia: Secondary | ICD-10-CM | POA: Diagnosis present

## 2019-01-01 LAB — CBC WITH DIFFERENTIAL/PLATELET
Abs Immature Granulocytes: 0.01 10*3/uL (ref 0.00–0.07)
Basophils Absolute: 0 10*3/uL (ref 0.0–0.1)
Basophils Relative: 0 %
Eosinophils Absolute: 0 10*3/uL (ref 0.0–0.5)
Eosinophils Relative: 0 %
HCT: 47.8 % — ABNORMAL HIGH (ref 36.0–46.0)
Hemoglobin: 15.2 g/dL — ABNORMAL HIGH (ref 12.0–15.0)
Immature Granulocytes: 0 %
Lymphocytes Relative: 18 %
Lymphs Abs: 0.7 10*3/uL (ref 0.7–4.0)
MCH: 29.4 pg (ref 26.0–34.0)
MCHC: 31.8 g/dL (ref 30.0–36.0)
MCV: 92.5 fL (ref 80.0–100.0)
Monocytes Absolute: 0.5 10*3/uL (ref 0.1–1.0)
Monocytes Relative: 15 %
Neutro Abs: 2.4 10*3/uL (ref 1.7–7.7)
Neutrophils Relative %: 67 %
Platelets: 163 10*3/uL (ref 150–400)
RBC: 5.17 MIL/uL — ABNORMAL HIGH (ref 3.87–5.11)
RDW: 12.7 % (ref 11.5–15.5)
WBC: 3.6 10*3/uL — ABNORMAL LOW (ref 4.0–10.5)
nRBC: 0 % (ref 0.0–0.2)

## 2019-01-01 LAB — URINALYSIS, ROUTINE W REFLEX MICROSCOPIC
Bilirubin Urine: NEGATIVE
Glucose, UA: NEGATIVE mg/dL
Hgb urine dipstick: NEGATIVE
Ketones, ur: 20 mg/dL — AB
Leukocytes,Ua: NEGATIVE
Nitrite: NEGATIVE
Protein, ur: 30 mg/dL — AB
Specific Gravity, Urine: 1.029 (ref 1.005–1.030)
pH: 5 (ref 5.0–8.0)

## 2019-01-01 LAB — COMPREHENSIVE METABOLIC PANEL
ALT: 33 U/L (ref 0–44)
AST: 29 U/L (ref 15–41)
Albumin: 3.8 g/dL (ref 3.5–5.0)
Alkaline Phosphatase: 44 U/L (ref 38–126)
Anion gap: 12 (ref 5–15)
BUN: 17 mg/dL (ref 8–23)
CO2: 25 mmol/L (ref 22–32)
Calcium: 8.8 mg/dL — ABNORMAL LOW (ref 8.9–10.3)
Chloride: 105 mmol/L (ref 98–111)
Creatinine, Ser: 0.98 mg/dL (ref 0.44–1.00)
GFR calc Af Amer: >60 mL/min (ref >60)
GFR calc non Af Amer: >60 mL/min (ref >60)
Glucose, Bld: 91 mg/dL (ref 70–99)
Potassium: 4 mmol/L (ref 3.5–5.1)
Sodium: 142 mmol/L (ref 135–145)
Total Bilirubin: 0.6 mg/dL (ref 0.3–1.2)
Total Protein: 7.2 g/dL (ref 6.5–8.1)

## 2019-01-01 LAB — POC SARS CORONAVIRUS 2 AG -  ED: SARS Coronavirus 2 Ag: POSITIVE — AB

## 2019-01-01 LAB — LIPASE, BLOOD: Lipase: 25 U/L (ref 11–51)

## 2019-01-01 LAB — I-STAT CHEM 8, ED
BUN: 19 mg/dL (ref 8–23)
Calcium, Ion: 0.98 mmol/L — ABNORMAL LOW (ref 1.15–1.40)
Chloride: 106 mmol/L (ref 98–111)
Creatinine, Ser: 0.9 mg/dL (ref 0.44–1.00)
Glucose, Bld: 89 mg/dL (ref 70–99)
HCT: 45 % (ref 36.0–46.0)
Hemoglobin: 15.3 g/dL — ABNORMAL HIGH (ref 12.0–15.0)
Potassium: 4 mmol/L (ref 3.5–5.1)
Sodium: 142 mmol/L (ref 135–145)
TCO2: 28 mmol/L (ref 22–32)

## 2019-01-01 LAB — CBG MONITORING, ED: Glucose-Capillary: 77 mg/dL (ref 70–99)

## 2019-01-01 LAB — LACTIC ACID, PLASMA: Lactic Acid, Venous: 1.4 mmol/L (ref 0.5–1.9)

## 2019-01-01 LAB — CK: Total CK: 561 U/L — ABNORMAL HIGH (ref 38–234)

## 2019-01-01 MED ORDER — SODIUM CHLORIDE 0.9 % IV BOLUS
1000.0000 mL | Freq: Once | INTRAVENOUS | Status: AC
  Administered 2019-01-01: 1000 mL via INTRAVENOUS

## 2019-01-01 MED ORDER — IOHEXOL 300 MG/ML  SOLN
100.0000 mL | Freq: Once | INTRAMUSCULAR | Status: AC | PRN
  Administered 2019-01-01: 100 mL via INTRAVENOUS

## 2019-01-01 MED ORDER — CALCIUM GLUCONATE-NACL 1-0.675 GM/50ML-% IV SOLN
1.0000 g | Freq: Once | INTRAVENOUS | Status: AC
  Administered 2019-01-02: 1000 mg via INTRAVENOUS
  Filled 2019-01-01: qty 50

## 2019-01-01 MED ORDER — ACETAMINOPHEN 325 MG PO TABS
650.0000 mg | ORAL_TABLET | Freq: Four times a day (QID) | ORAL | Status: DC | PRN
  Administered 2019-01-04 – 2019-01-09 (×2): 650 mg via ORAL
  Filled 2019-01-01 (×2): qty 2

## 2019-01-01 MED ORDER — SODIUM CHLORIDE 0.9 % IV SOLN
INTRAVENOUS | Status: AC
  Administered 2019-01-02: 04:00:00 via INTRAVENOUS

## 2019-01-01 MED ORDER — ENOXAPARIN SODIUM 40 MG/0.4ML ~~LOC~~ SOLN
40.0000 mg | Freq: Every day | SUBCUTANEOUS | Status: DC
  Administered 2019-01-02 – 2019-01-13 (×12): 40 mg via SUBCUTANEOUS
  Filled 2019-01-01 (×12): qty 0.4

## 2019-01-01 MED ORDER — ACETAMINOPHEN 650 MG RE SUPP
650.0000 mg | Freq: Four times a day (QID) | RECTAL | Status: DC | PRN
  Administered 2019-01-02 – 2019-01-03 (×2): 650 mg via RECTAL
  Filled 2019-01-01 (×2): qty 1

## 2019-01-01 NOTE — ED Triage Notes (Signed)
Patient arrives via EMS from [place] due to new onset AMS. Per ems, patient is usually ambulatory with 1 assist, but she spent the day in her wheelchair unable to ambulate. When EMS attempted to ambulate patient at the facility, she had difficulty coordinating her steps. Facility staff was also concerned about new onset nystagmus.  Per ems, patient has moments of garble speech at baseline. Reports of chills at well.

## 2019-01-01 NOTE — ED Notes (Signed)
POC CoV-2 COVID test POSITIVE, Called to [employer] PA

## 2019-01-01 NOTE — H&P (Signed)
History and Physical    Barbara Gill [id number] DOB: [birth year] DOA: 01/01/2019  PCP: Marton Redwood, MD Patient coming from: [facility]  Chief Complaint: Altered mental status  HPI: Barbara Gill is a 63 y.o. female with medical history significant of advanced Alzheimer's dementia, hypertension, hyperlipidemia presenting from her nursing home for evaluation of behavioral change noted today.  Nursing home reported to EMS that patient is usually ambulatory with a walker or 1 person assist but today was unable to get up and walk.  They reported that she is nonverbal at baseline with occasional "nonsensical rambling."  Though the facility did notice she seems to be "babbling less."  EMS reported that they were able to stand the patient from her wheelchair, however, "it seemed as though she forgot how to walk."  They asked the patient to put her weight on one foot and advanced the other and patient was able to support her weight.  ED Course: SARS-CoV-2 rapid antigen test positive.  Hypotensive on arrival with blood pressure 79/62.  Received 1 L normal saline bolus and subsequent blood pressure readings improved.  Not tachycardic, tachypneic, or hypoxic.  WBC count 3.6.  Lactic acid normal.  Blood glucose 91.  Lipase and LFTs normal.  UA not suggestive of infection.  Urine culture pending.  CK 561.  Ionized calcium mildly low at 0.98. Chest x-ray showing no acute abnormalities. Patient noted to have left hip tenderness on exam.  X-ray findings indeterminate for a nondisplaced subcapital fracture of the left femoral neck. CT head showing no acute intracranial abnormalities. CT abdomen pelvis showing no acute abdominal or pelvic abnormality.  Review of Systems:  All systems reviewed and apart from history of presenting illness, are negative.  Past Medical History:  Diagnosis Date   Alzheimer's disease ([place])    High cholesterol    Hyperlipemia    Hypertension     History  reviewed. No pertinent surgical history.   reports that she has been smoking. She has never used smokeless tobacco. She reports current alcohol use. She reports that she does not use drugs.  Allergies  Allergen Reactions   Seasonal Ic [Cholestatin]     Runny nose, itchy eyes   Poison Ivy Extract [Poison Ivy Extract] Rash    Family History  Problem Relation Age of Onset   Cancer Father    Hypothyroidism Father    Hypertension Mother        borderline   Diabetes Mother    Breast cancer Mother     Prior to Admission medications   Medication Sig Start Date End Date Taking? Authorizing Provider  cetirizine (ZYRTEC) 10 MG tablet Take 10 mg by mouth daily.   Yes [provider]  divalproex (DEPAKOTE ER) 250 MG 24 hr tablet Take 250 mg by mouth 2 (two) times daily.   Yes Marton Redwood, MD  donepezil (ARICEPT) 10 MG tablet Take 10 mg by mouth at bedtime.   Yes [provider]  escitalopram (LEXAPRO) 10 MG tablet Take 10 mg by mouth daily.   Yes [provider]  Memantine HCl ER (NAMENDA XR) 28 MG CP24 Take 28 mg by mouth daily.   Yes [provider]  saccharomyces boulardii (FLORASTOR) 250 MG capsule Take 250 mg by mouth daily.   Yes [provider]  cephALEXin (KEFLEX) 500 MG capsule Take 1 capsule (500 mg total) by mouth 3 (three) times daily. Patient not taking: Reported on 01/01/2019 04/12/18   Ward, Ozella Almond, PA-C  Physical Exam: Vitals:   01/01/19 1829 01/01/19 2000 01/01/19 2100 01/01/19 2300  BP:  128/70 127/75 127/67  Pulse:  (!) 59 66 60  Resp:  18 20 19   Temp: 99 F (37.2 C)     TempSrc: Rectal     SpO2:  99% 99% 98%    Physical Exam  Constitutional: She appears well-developed and well-nourished. No distress.  HENT:  Head: Normocephalic.  Dry mucous membranes  Eyes: Right eye exhibits no discharge. Left eye exhibits no discharge.  Unable to assess pupillary reaction due to lack of patient cooperation  Neck:  Neck supple.  Cardiovascular: Regular rhythm and intact distal pulses.  Bradycardic with heart rate in the low 50s  Pulmonary/Chest: Effort normal and breath sounds normal. No respiratory distress. She has no wheezes. She has no rales.  Abdominal: Soft. Bowel sounds are normal. She exhibits no distension. There is no abdominal tenderness. There is no guarding.  Musculoskeletal:        General: No edema.  Neurological:  Somnolent but opening eyes intermittently Nonverbal Not following any commands  Skin: Skin is warm and dry. She is not diaphoretic.     Labs on Admission: I have personally reviewed following labs and imaging studies  CBC: Recent Labs  Lab 01/01/19 1723 01/01/19 1759  WBC 3.6*  --   NEUTROABS 2.4  --   HGB 15.2* 15.3*  HCT 47.8* 45.0  MCV 92.5  --   PLT 163  --    Basic Metabolic Panel: Recent Labs  Lab 01/01/19 1723 01/01/19 1759  NA 142 142  K 4.0 4.0  CL 105 106  CO2 25  --   GLUCOSE 91 89  BUN 17 19  CREATININE 0.98 0.90  CALCIUM 8.8*  --    GFR: CrCl cannot be calculated (Unknown ideal weight.). Liver Function Tests: Recent Labs  Lab 01/01/19 1723  AST 29  ALT 33  ALKPHOS 44  BILITOT 0.6  PROT 7.2  ALBUMIN 3.8   Recent Labs  Lab 01/01/19 1723  LIPASE 25   No results for input(s): AMMONIA in the last 168 hours. Coagulation Profile: No results for input(s): INR, PROTIME in the last 168 hours. Cardiac Enzymes: Recent Labs  Lab 01/01/19 1723  CKTOTAL 561*   BNP (last 3 results) No results for input(s): PROBNP in the last 8760 hours. HbA1C: No results for input(s): HGBA1C in the last 72 hours. CBG: Recent Labs  Lab 01/01/19 1715  GLUCAP 77   Lipid Profile: No results for input(s): CHOL, HDL, LDLCALC, TRIG, CHOLHDL, LDLDIRECT in the last 72 hours. Thyroid Function Tests: No results for input(s): TSH, T4TOTAL, FREET4, T3FREE, THYROIDAB in the last 72 hours. Anemia Panel: No results for input(s): VITAMINB12, FOLATE,  FERRITIN, TIBC, IRON, RETICCTPCT in the last 72 hours. Urine analysis:    Component Value Date/Time   COLORURINE YELLOW 01/01/2019 1730   APPEARANCEUR HAZY (A) 01/01/2019 1730   LABSPEC 1.029 01/01/2019 1730   PHURINE 5.0 01/01/2019 1730   GLUCOSEU NEGATIVE 01/01/2019 1730   HGBUR NEGATIVE 01/01/2019 1730   BILIRUBINUR NEGATIVE 01/01/2019 1730   KETONESUR 20 (A) 01/01/2019 1730   PROTEINUR 30 (A) 01/01/2019 1730   NITRITE NEGATIVE 01/01/2019 1730   LEUKOCYTESUR NEGATIVE 01/01/2019 1730    Radiological Exams on Admission: Dg Chest 1 View  Result Date: 01/01/2019 CLINICAL DATA:  Altered mental status, nystagmus, history hypertension, Alzheimer's EXAM: CHEST  1 VIEW COMPARISON:  Portable exam 1757 hours without priors for comparison FINDINGS: Normal heart size,  mediastinal contours, and pulmonary vascularity. Lungs clear. No pleural effusion or pneumothorax. Bones unremarkable. IMPRESSION: No acute abnormalities. Electronically Signed   By: Lavonia Dana M.D.   On: 01/01/2019 18:33   Ct Head Wo Contrast  Result Date: 01/01/2019 CLINICAL DATA:  New onset altered mental status, unable to ambulate, history stroke, hypertension, Alzheimer's EXAM: CT HEAD WITHOUT CONTRAST TECHNIQUE: Contiguous axial images were obtained from the base of the skull through the vertex without intravenous contrast. Sagittal and coronal MPR images reconstructed from axial data set. COMPARISON:  None FINDINGS: Brain: Generalized atrophy. Mild ex vacuo dilatation of the ventricular system. No midline shift or mass effect. Minimal small vessel chronic ischemic changes of deep cerebral white matter. No intracranial hemorrhage, mass lesion, or evidence of acute infarction. No extra-axial fluid collections. Vascular: No hyperdense vessels. Minimal atherosclerotic calcifications of internal carotid arteries at skull base Skull: Intact Sinuses/Orbits: Clear Other: N/A IMPRESSION: Atrophy with minimal small vessel chronic ischemic  changes of deep cerebral white matter. No acute intracranial abnormalities. Electronically Signed   By: Lavonia Dana M.D.   On: 01/01/2019 19:52   Ct Abdomen Pelvis W Contrast  Result Date: 01/01/2019 CLINICAL DATA:  Encephalopathy with abdominal distension and difficulty ambulating. EXAM: CT ABDOMEN AND PELVIS WITH CONTRAST TECHNIQUE: Multidetector CT imaging of the abdomen and pelvis was performed using the standard protocol following bolus administration of intravenous contrast. CONTRAST:  150mL OMNIPAQUE IOHEXOL 300 MG/ML  SOLN COMPARISON:  None. FINDINGS: LOWER CHEST: Small pleural effusions HEPATOBILIARY: Normal hepatic contours. No intra- or extrahepatic biliary dilatation. Normal gallbladder. PANCREAS: Normal pancreas. No ductal dilatation or peripancreatic fluid collection. SPLEEN: Normal. ADRENALS/URINARY TRACT: --Adrenal glands: Normal. --Kidneys and ureters: No hydronephrosis, nephroureterolithiasis or solid renal mass. --Urinary bladder: Normal for degree of distention STOMACH/BOWEL: --Stomach/Duodenum: No hiatal hernia. Normal duodenal course and caliber. --Small bowel: No dilatation or inflammation. --Colon: No focal abnormality. --Appendix: Not visualized. No right lower quadrant inflammation or free fluid. VASCULAR/LYMPHATIC: There is calcific atherosclerosis of the abdominal aorta. No abdominal or pelvic lymphadenopathy. REPRODUCTIVE: Normal uterus. No adnexal mass. MUSCULOSKELETAL. Grade 1 facetogenic anterolisthesis at L4-5. OTHER: None. IMPRESSION: 1. No acute abdominal or pelvic abnormality. 2. Small pleural effusions. 3. Aortic Atherosclerosis (ICD10-I70.0). Electronically Signed   By: Ulyses Jarred M.D.   On: 01/01/2019 19:59   Dg Hip Unilat W Or Wo Pelvis 2-3 Views Left  Result Date: 01/01/2019 CLINICAL DATA:  Fall and left hip pain EXAM: DG HIP (WITH OR WITHOUT PELVIS) 2-3V LEFT COMPARISON:  None. FINDINGS: Suspected cortical irregularity in the subcapital region of the left femoral  neck seen on the AP view of the pelvis is indeterminate for a nondisplaced subcapital fracture. This apparent cortical irregularity is not seen on the other views of the hip. There is no evidence of hip dislocation. Degenerative changes are seen in both hips and in the lumbar spine. IMPRESSION: 1. Findings indeterminate for a nondisplaced subcapital fracture of the left femoral neck. If there is clinical concern, MR or CT of the pelvis and left hip could be performed. Electronically Signed   By: Zerita Boers M.D.   On: 01/01/2019 18:35    EKG: Independently reviewed.  Sinus rhythm, low voltage, RBBB.  No prior tracing for comparison.  Assessment/Plan Principal Problem:   Acute encephalopathy Active Problems:   COVID-19 virus infection   Hypotension   Hip tenderness, left   Sinus bradycardia   Acute encephalopathy, inability to walk Patient has advanced dementia and is nonverbal at baseline.  Per nursing home  report, she is usually ambulatory with a walker or 1 person assist but today was unable to get up and walk.  Patient had difficulty walking but was able to support her weight for EMS.  Patient is currently somnolent but opening eyes intermittently.  Not following any commands.  Head CT negative for acute finding. ?Seizure activity as CK mildly elevated.  Acute encephalopathy likely related to acute viral illness/dehydration.  Hypotensive on arrival.  Patient tested positive for COVID-19. -Keep n.p.o., aspiration precautions, SLP eval in am -Check TSH, B12, ammonia levels -Check UDS -EEG, seizure precautions -Brain MRI to rule out acute CVA -PT/OT evaluation  COVID-19 positive SARS-CoV-2 rapid antigen test positive.  No signs of respiratory distress.  Not tachypneic or hypoxic.  Chest x-ray not suggestive of pneumonia.  Afebrile and no leukocytosis.  Lactic acid normal. -Will hold off starting steroid at this time -Does not meet criteria for remdesivir -Vitamin C and zinc when more  awake/alert and passes swallow eval -Check inflammatory markers -Airborne and contact precautions -Continuous pulse ox -Supplemental oxygen if needed to keep oxygen saturation above 90%  Hypotension Hypotensive on arrival, currently normotensive after receiving 1 L IV fluid bolus.  Suspect related to acute viral illness/dehydration.  Chest x-ray showing no acute abnormality.  EKG not suggestive of ACS.  PE less likely given no tachycardia, tachypnea, or hypoxia.  No antihypertensives listed in home medications. -IV fluid hydration -Continue to monitor blood pressure closely  Mild CK elevation CK 561. ?Seizure activity in setting of AMS. -IV fluid hydration -Repeat CK level in a.m. -Seizure precautions  Hypocalcemia Ionized calcium mildly low at 0.98. -Calcium gluconate 1 g  Left hip tenderness Patient noted to have left hip tenderness on exam done in the ED.  No falls reported by nursing home.  X-ray findings indeterminate for a nondisplaced subcapital fracture of the left femoral neck. -Hip MRI for further evaluation  Sinus bradycardia Bradycardic with heart rate in the low 50s. -Cardiac monitoring -Check TSH  DVT prophylaxis: Lovenox Code Status: Full code Family Communication: No family available. Disposition Plan: Anticipate discharge after clinical improvement. Admission status: It is my clinical opinion that referral for OBSERVATION is reasonable and necessary in this patient based on the above information provided. The aforementioned taken together are felt to place the patient at high risk for further clinical deterioration. However it is anticipated that the patient may be medically stable for discharge from the hospital within 24 to 48 hours.  The medical decision making on this patient was of high complexity and the patient is at high risk for clinical deterioration, therefore this is a level 3 visit.  Shela Leff MD [facility] Pager [phone]  If 7PM-7AM, please contact night-coverage [email or web address] Password [email or web address]  01/01/2019, 11:53 PM

## 2019-01-01 NOTE — ED Notes (Signed)
Pt to xray

## 2019-01-01 NOTE — ED Notes (Signed)
[place] from [employer] is calling asking for a update on patient  Please call back

## 2019-01-01 NOTE — ED Provider Notes (Signed)
[facility] EMERGENCY DEPARTMENT Provider Note   CSN: [id number] Arrival date & time: 01/01/19  1700     History   Chief Complaint Chief Complaint  Patient presents with  . Altered Mental Status    HPI Barbara Gill is a 63 y.o. female.     HPI   Level 5 caveat due to nonverbal at baseline.  Barbara Gill is a 63 y.o. female, with a history of Alzheimer's, hypercholesterolemia, HTN, presenting to the ED with behavior change.  Patient is from skilled nursing facility.  They reported to EMS that patient is usually ambulatory with a walker or one person assist, but today was unable to get up and walk.  They report she is nonverbal at baseline with occasional "nonsensical rambling."  Though the facility did note she seems to be "babbling less."  Although they state these abnormalities began today, they also could not provide a last seen normal. EMS reports they were able to stand the patient from her wheelchair, however, "it seemed as though she forgot how to walk."  They talked her through putting her weight on one foot and advancing the other and patient was able to support her weight.      Past Medical History:  Diagnosis Date  . Alzheimer's disease ([place])   . High cholesterol   . Hyperlipemia   . Hypertension     Patient Active Problem List   Diagnosis Date Noted  . COVID-19 virus infection 01/01/2019  . Acute encephalopathy 01/01/2019  . Hypotension 01/01/2019  . Hip tenderness, left 01/01/2019  . Sinus bradycardia 01/01/2019  . Diabetes ([place]) 11/08/2015  . Alzheimer's disease ([place]) 11/08/2015  . OSA (obstructive sleep apnea) 11/08/2015  . Nicotine dependence 11/08/2015  . Hyperlipemia 11/08/2015  . ADD (attention deficit disorder) 11/08/2015  . Generalized anxiety disorder 11/08/2015  . Depression 11/08/2015    History reviewed. No pertinent surgical history.   OB History   No obstetric history on file.      Home Medications    Prior to Admission medications   Medication Sig Start Date End Date Taking? Authorizing Provider  cetirizine (ZYRTEC) 10 MG tablet Take 10 mg by mouth daily.   Yes [provider]  divalproex (DEPAKOTE ER) 250 MG 24 hr tablet Take 250 mg by mouth 2 (two) times daily.   Yes Marton Redwood, MD  donepezil (ARICEPT) 10 MG tablet Take 10 mg by mouth at bedtime.   Yes [provider]  escitalopram (LEXAPRO) 10 MG tablet Take 10 mg by mouth daily.   Yes [provider]  Memantine HCl ER (NAMENDA XR) 28 MG CP24 Take 28 mg by mouth daily.   Yes [provider]  saccharomyces boulardii (FLORASTOR) 250 MG capsule Take 250 mg by mouth daily.   Yes [provider]  cephALEXin (KEFLEX) 500 MG capsule Take 1 capsule (500 mg total) by mouth 3 (three) times daily. Patient not taking: Reported on 01/01/2019 04/12/18   Ward, Ozella Almond, PA-C    Family History Family History  Problem Relation Age of Onset  . Cancer Father   . Hypothyroidism Father   . Hypertension Mother        borderline  . Diabetes Mother   . Breast cancer Mother     Social History Social History   Tobacco Use  . Smoking status: Current Every Day Smoker  . Smokeless tobacco: Never Used  . Tobacco comment: 1 pack weekly   Substance Use Topics  .  Alcohol use: Yes    Alcohol/week: 0.0 standard drinks    Comment: occas.,once every two months  . Drug use: No     Allergies   Seasonal ic [cholestatin] and Poison ivy extract [poison ivy extract]   Review of Systems Review of Systems  Unable to perform ROS: Patient nonverbal     Physical Exam Updated Vital Signs BP (!) 116/56 (BP Location: Right Arm)   Pulse 66   Temp 98.7 F (37.1 C) (Oral)   Resp 14   SpO2 100%   Physical Exam Vitals signs and nursing note reviewed.  Constitutional:      General: She is not in acute distress.    Appearance: She is well-developed. She is not diaphoretic.  HENT:     Head: Normocephalic  and atraumatic.     Mouth/Throat:     Mouth: Mucous membranes are dry.     Pharynx: Oropharynx is clear.  Eyes:     Conjunctiva/sclera: Conjunctivae normal.  Neck:     Musculoskeletal: Neck supple.  Cardiovascular:     Rate and Rhythm: Normal rate and regular rhythm.     Pulses: Normal pulses.          Radial pulses are 2+ on the right side and 2+ on the left side.       Posterior tibial pulses are 2+ on the right side and 2+ on the left side.     Heart sounds: Normal heart sounds.     Comments: Tactile temperature in the extremities appropriate and equal bilaterally. Pulmonary:     Effort: Pulmonary effort is normal. No respiratory distress.     Breath sounds: Normal breath sounds.  Abdominal:     Palpations: Abdomen is soft.     Tenderness: There is generalized abdominal tenderness. There is no guarding.     Comments: Moans when I press on the abdomen.  Musculoskeletal:     Right lower leg: No edema.     Left lower leg: No edema.     Comments: Cries out when I press on the left lateral hip.  No noted swelling, color change, or deformity.  She does not have the same reaction when I press on the right side.  Lymphadenopathy:     Cervical: No cervical adenopathy.  Skin:    General: Skin is warm and dry.  Neurological:     Mental Status: She is alert.     Deep Tendon Reflexes:     Reflex Scores:      Patellar reflexes are 2+ on the right side and 2+ on the left side.    Comments: She is able to wiggle the toes on command bilaterally. She has good resistant muscle tone in the upper and lower extremities.  At different points during the assessment, patient was noted to move each of her extremities and she seems to have equal use bilaterally.  Psychiatric:        Mood and Affect: Mood and affect normal.        Speech: Speech normal.        Behavior: Behavior normal.      ED Treatments / Results  Labs (all labs ordered are listed, but only abnormal results are displayed)  Labs Reviewed  CBC WITH DIFFERENTIAL/PLATELET - Abnormal; Notable for the following components:      Result Value   WBC 3.6 (*)    RBC 5.17 (*)    Hemoglobin 15.2 (*)    HCT 47.8 (*)  All other components within normal limits  COMPREHENSIVE METABOLIC PANEL - Abnormal; Notable for the following components:   Calcium 8.8 (*)    All other components within normal limits  URINALYSIS, ROUTINE W REFLEX MICROSCOPIC - Abnormal; Notable for the following components:   APPearance HAZY (*)    Ketones, ur 20 (*)    Protein, ur 30 (*)    Bacteria, UA RARE (*)    All other components within normal limits  CK - Abnormal; Notable for the following components:   Total CK 561 (*)    All other components within normal limits  POC SARS CORONAVIRUS 2 AG -  ED - Abnormal; Notable for the following components:   SARS Coronavirus 2 Ag POSITIVE (*)    All other components within normal limits  I-STAT CHEM 8, ED - Abnormal; Notable for the following components:   Calcium, Ion 0.98 (*)    Hemoglobin 15.3 (*)    All other components within normal limits  URINE CULTURE  LIPASE, BLOOD  LACTIC ACID, PLASMA  HIV ANTIBODY (ROUTINE TESTING W REFLEX)  TSH  VITAMIN B12  AMMONIA  RAPID URINE DRUG SCREEN, HOSP PERFORMED  CBC  CK  FERRITIN  FIBRINOGEN  D-DIMER, QUANTITATIVE (NOT AT [facility])  LACTATE DEHYDROGENASE  PROCALCITONIN  C-REACTIVE PROTEIN  CBG MONITORING, ED    EKG EKG Interpretation  Date/Time:  Sunday January 01 2019 17:10:42 EST Ventricular Rate:  64 PR Interval:    QRS Duration: 108 QT Interval:  453 QTC Calculation: 468 R Axis:   -53 Text Interpretation: Sinus rhythm LAD, consider left anterior fascicular block Low voltage, precordial leads RSR' in V1 or V2, right VCD or RVH No significant change since last tracing Confirmed by Wandra Arthurs [phone]) on 01/01/2019 5:13:24 PM   Radiology Dg Chest 1 View  Result Date: 01/01/2019 CLINICAL DATA:  Altered mental status, nystagmus,  history hypertension, Alzheimer's EXAM: CHEST  1 VIEW COMPARISON:  Portable exam 1757 hours without priors for comparison FINDINGS: Normal heart size, mediastinal contours, and pulmonary vascularity. Lungs clear. No pleural effusion or pneumothorax. Bones unremarkable. IMPRESSION: No acute abnormalities. Electronically Signed   By: Lavonia Dana M.D.   On: 01/01/2019 18:33   Ct Head Wo Contrast  Result Date: 01/01/2019 CLINICAL DATA:  New onset altered mental status, unable to ambulate, history stroke, hypertension, Alzheimer's EXAM: CT HEAD WITHOUT CONTRAST TECHNIQUE: Contiguous axial images were obtained from the base of the skull through the vertex without intravenous contrast. Sagittal and coronal MPR images reconstructed from axial data set. COMPARISON:  None FINDINGS: Brain: Generalized atrophy. Mild ex vacuo dilatation of the ventricular system. No midline shift or mass effect. Minimal small vessel chronic ischemic changes of deep cerebral white matter. No intracranial hemorrhage, mass lesion, or evidence of acute infarction. No extra-axial fluid collections. Vascular: No hyperdense vessels. Minimal atherosclerotic calcifications of internal carotid arteries at skull base Skull: Intact Sinuses/Orbits: Clear Other: N/A IMPRESSION: Atrophy with minimal small vessel chronic ischemic changes of deep cerebral white matter. No acute intracranial abnormalities. Electronically Signed   By: Lavonia Dana M.D.   On: 01/01/2019 19:52   Ct Abdomen Pelvis W Contrast  Result Date: 01/01/2019 CLINICAL DATA:  Encephalopathy with abdominal distension and difficulty ambulating. EXAM: CT ABDOMEN AND PELVIS WITH CONTRAST TECHNIQUE: Multidetector CT imaging of the abdomen and pelvis was performed using the standard protocol following bolus administration of intravenous contrast. CONTRAST:  184mL OMNIPAQUE IOHEXOL 300 MG/ML  SOLN COMPARISON:  None. FINDINGS: LOWER CHEST: Small pleural effusions  HEPATOBILIARY: Normal hepatic  contours. No intra- or extrahepatic biliary dilatation. Normal gallbladder. PANCREAS: Normal pancreas. No ductal dilatation or peripancreatic fluid collection. SPLEEN: Normal. ADRENALS/URINARY TRACT: --Adrenal glands: Normal. --Kidneys and ureters: No hydronephrosis, nephroureterolithiasis or solid renal mass. --Urinary bladder: Normal for degree of distention STOMACH/BOWEL: --Stomach/Duodenum: No hiatal hernia. Normal duodenal course and caliber. --Small bowel: No dilatation or inflammation. --Colon: No focal abnormality. --Appendix: Not visualized. No right lower quadrant inflammation or free fluid. VASCULAR/LYMPHATIC: There is calcific atherosclerosis of the abdominal aorta. No abdominal or pelvic lymphadenopathy. REPRODUCTIVE: Normal uterus. No adnexal mass. MUSCULOSKELETAL. Grade 1 facetogenic anterolisthesis at L4-5. OTHER: None. IMPRESSION: 1. No acute abdominal or pelvic abnormality. 2. Small pleural effusions. 3. Aortic Atherosclerosis (ICD10-I70.0). Electronically Signed   By: Ulyses Jarred M.D.   On: 01/01/2019 19:59   Dg Hip Unilat W Or Wo Pelvis 2-3 Views Left  Result Date: 01/01/2019 CLINICAL DATA:  Fall and left hip pain EXAM: DG HIP (WITH OR WITHOUT PELVIS) 2-3V LEFT COMPARISON:  None. FINDINGS: Suspected cortical irregularity in the subcapital region of the left femoral neck seen on the AP view of the pelvis is indeterminate for a nondisplaced subcapital fracture. This apparent cortical irregularity is not seen on the other views of the hip. There is no evidence of hip dislocation. Degenerative changes are seen in both hips and in the lumbar spine. IMPRESSION: 1. Findings indeterminate for a nondisplaced subcapital fracture of the left femoral neck. If there is clinical concern, MR or CT of the pelvis and left hip could be performed. Electronically Signed   By: Zerita Boers M.D.   On: 01/01/2019 18:35    Procedures Procedures (including critical care time)  Medications Ordered in ED  Medications  enoxaparin (LOVENOX) injection 40 mg (has no administration in time range)  acetaminophen (TYLENOL) tablet 650 mg (has no administration in time range)    Or  acetaminophen (TYLENOL) suppository 650 mg (has no administration in time range)  0.9 %  sodium chloride infusion (has no administration in time range)  calcium gluconate 1 g/ 50 mL sodium chloride IVPB (has no administration in time range)  sodium chloride 0.9 % bolus 1,000 mL (1,000 mLs Intravenous New Bag/Given 01/01/19 1834)  iohexol (OMNIPAQUE) 300 MG/ML solution 100 mL (100 mLs Intravenous Contrast Given 01/01/19 1931)     Initial Impression / Assessment and Plan / ED Course  I have reviewed the triage vital signs and the nursing notes.  Pertinent labs & imaging results that were available during my care of the patient were reviewed by me and considered in my medical decision making (see chart for details).  Clinical Course as of Jan 02 43  Sun Jan 01, 2019  1715 I suspect this to be an erroneous reading as shortly after this blood pressure was taken, a much more acceptable blood pressure was obtained.  BP(!): 79/62 [SJ]  2008 Spoke with Dr. Collins Scotland, radiologist. I discussed additional clinical suspicion and physical exam findings. He reviewed the CT again and states he does not see a fracture on the pelvis or left hip. If still worried about fracture, may obtain MR hip.  CT ABDOMEN PELVIS W CONTRAST [SJ]  2149 Spoke with Dr. Marlowe Sax, hospitalist. Agrees to admit the patient for observation.  Requests neurology consult to weigh in on possible stroke work-up.  Also requests social work consult for advice on disposition of Covid positive patients to skilled nursing facilities. Also requests MR left hip to assure no fracture.   [SJ]  2237  Spoke with Dr. Lorraine Lax, neurologist. States he will make contact with Dr. Marlowe Sax.   [SJ]    Clinical Course User Index [SJ] Georgia Delsignore C, PA-C       Patient presents for  evaluation due to change in behavior today. Patient is nontoxic appearing, afebrile, not tachycardic, not tachypneic, not hypotensive, maintains excellent SPO2 on room air, and is in no apparent distress.  Ketonuria and dry intraoral mucosa suggest dehydration. COVID-19 test positive.  Some of the patient's presentation noted here in the ED could simply be attributed to day-to-day variations in a patient with advanced dementia, however, Covid encephalopathy is also another possibility. No acute abnormality on head CT. Hip x-ray with question of nondisplaced fracture, this finding was unsupported on subsequent CT. Patient admitted for further management.  Findings and plan of care discussed with Shirlyn Goltz, MD.    Final Clinical Impressions(s) / ED Diagnoses   Final diagnoses:  [id number]    ED Discharge Orders    None       Layla Maw 01/02/19 0050    Drenda Freeze, MD 01/02/19 1454

## 2019-01-01 NOTE — ED Notes (Signed)
Amanda [phone]

## 2019-01-02 ENCOUNTER — Observation Stay (HOSPITAL_COMMUNITY): Payer: PPO

## 2019-01-02 DIAGNOSIS — G9340 Encephalopathy, unspecified: Secondary | ICD-10-CM

## 2019-01-02 DIAGNOSIS — J302 Other seasonal allergic rhinitis: Secondary | ICD-10-CM | POA: Diagnosis present

## 2019-01-02 DIAGNOSIS — D72819 Decreased white blood cell count, unspecified: Secondary | ICD-10-CM | POA: Diagnosis present

## 2019-01-02 DIAGNOSIS — F319 Bipolar disorder, unspecified: Secondary | ICD-10-CM | POA: Diagnosis present

## 2019-01-02 DIAGNOSIS — N390 Urinary tract infection, site not specified: Secondary | ICD-10-CM | POA: Diagnosis not present

## 2019-01-02 DIAGNOSIS — G309 Alzheimer's disease, unspecified: Secondary | ICD-10-CM | POA: Diagnosis present

## 2019-01-02 DIAGNOSIS — G9341 Metabolic encephalopathy: Secondary | ICD-10-CM | POA: Diagnosis present

## 2019-01-02 DIAGNOSIS — E785 Hyperlipidemia, unspecified: Secondary | ICD-10-CM | POA: Diagnosis present

## 2019-01-02 DIAGNOSIS — E7800 Pure hypercholesterolemia, unspecified: Secondary | ICD-10-CM | POA: Diagnosis present

## 2019-01-02 DIAGNOSIS — Z23 Encounter for immunization: Secondary | ICD-10-CM | POA: Diagnosis present

## 2019-01-02 DIAGNOSIS — F0280 Dementia in other diseases classified elsewhere without behavioral disturbance: Secondary | ICD-10-CM | POA: Diagnosis present

## 2019-01-02 DIAGNOSIS — I10 Essential (primary) hypertension: Secondary | ICD-10-CM | POA: Diagnosis present

## 2019-01-02 DIAGNOSIS — F411 Generalized anxiety disorder: Secondary | ICD-10-CM | POA: Diagnosis present

## 2019-01-02 DIAGNOSIS — D6959 Other secondary thrombocytopenia: Secondary | ICD-10-CM | POA: Diagnosis present

## 2019-01-02 DIAGNOSIS — I959 Hypotension, unspecified: Secondary | ICD-10-CM | POA: Diagnosis present

## 2019-01-02 DIAGNOSIS — R001 Bradycardia, unspecified: Secondary | ICD-10-CM | POA: Diagnosis present

## 2019-01-02 DIAGNOSIS — J1289 Other viral pneumonia: Secondary | ICD-10-CM | POA: Diagnosis not present

## 2019-01-02 DIAGNOSIS — F17210 Nicotine dependence, cigarettes, uncomplicated: Secondary | ICD-10-CM | POA: Diagnosis present

## 2019-01-02 DIAGNOSIS — J9601 Acute respiratory failure with hypoxia: Secondary | ICD-10-CM | POA: Diagnosis not present

## 2019-01-02 DIAGNOSIS — G4733 Obstructive sleep apnea (adult) (pediatric): Secondary | ICD-10-CM | POA: Diagnosis present

## 2019-01-02 DIAGNOSIS — B9622 Other specified Shiga toxin-producing Escherichia coli [E. coli] (STEC) as the cause of diseases classified elsewhere: Secondary | ICD-10-CM | POA: Diagnosis not present

## 2019-01-02 DIAGNOSIS — U071 COVID-19: Secondary | ICD-10-CM | POA: Diagnosis present

## 2019-01-02 DIAGNOSIS — R4182 Altered mental status, unspecified: Secondary | ICD-10-CM | POA: Diagnosis not present

## 2019-01-02 DIAGNOSIS — R4701 Aphasia: Secondary | ICD-10-CM | POA: Diagnosis present

## 2019-01-02 DIAGNOSIS — F909 Attention-deficit hyperactivity disorder, unspecified type: Secondary | ICD-10-CM | POA: Diagnosis present

## 2019-01-02 DIAGNOSIS — M1612 Unilateral primary osteoarthritis, left hip: Secondary | ICD-10-CM | POA: Diagnosis not present

## 2019-01-02 LAB — CBC
HCT: 41.9 % (ref 36.0–46.0)
Hemoglobin: 13.3 g/dL (ref 12.0–15.0)
MCH: 29.4 pg (ref 26.0–34.0)
MCHC: 31.7 g/dL (ref 30.0–36.0)
MCV: 92.7 fL (ref 80.0–100.0)
Platelets: 147 10*3/uL — ABNORMAL LOW (ref 150–400)
RBC: 4.52 MIL/uL (ref 3.87–5.11)
RDW: 12.7 % (ref 11.5–15.5)
WBC: 3.7 10*3/uL — ABNORMAL LOW (ref 4.0–10.5)
nRBC: 0 % (ref 0.0–0.2)

## 2019-01-02 LAB — TSH: TSH: 3.147 u[IU]/mL (ref 0.350–4.500)

## 2019-01-02 LAB — AMMONIA: Ammonia: 16 umol/L (ref 9–35)

## 2019-01-02 LAB — FIBRINOGEN: Fibrinogen: 422 mg/dL (ref 210–475)

## 2019-01-02 LAB — C-REACTIVE PROTEIN: CRP: 1.4 mg/dL — ABNORMAL HIGH (ref <1.0)

## 2019-01-02 LAB — HIV ANTIBODY (ROUTINE TESTING W REFLEX): HIV Screen 4th Generation wRfx: NONREACTIVE

## 2019-01-02 LAB — CK: Total CK: 653 U/L — ABNORMAL HIGH (ref 38–234)

## 2019-01-02 LAB — PROCALCITONIN: Procalcitonin: <0.1 ng/mL

## 2019-01-02 LAB — FERRITIN: Ferritin: 237 ng/mL (ref 11–307)

## 2019-01-02 LAB — D-DIMER, QUANTITATIVE: D-Dimer, Quant: 0.34 ug/mL-FEU (ref 0.00–0.50)

## 2019-01-02 LAB — VITAMIN B12: Vitamin B-12: 711 pg/mL (ref 180–914)

## 2019-01-02 LAB — LACTATE DEHYDROGENASE: LDH: 163 U/L (ref 98–192)

## 2019-01-02 MED ORDER — ORAL CARE MOUTH RINSE
15.0000 mL | Freq: Two times a day (BID) | OROMUCOSAL | Status: DC
  Administered 2019-01-05 – 2019-01-12 (×16): 15 mL via OROMUCOSAL

## 2019-01-02 MED ORDER — PNEUMOCOCCAL VAC POLYVALENT 25 MCG/0.5ML IJ INJ
0.5000 mL | INJECTION | INTRAMUSCULAR | Status: AC
  Administered 2019-01-03: 0.5 mL via INTRAMUSCULAR
  Filled 2019-01-02: qty 0.5

## 2019-01-02 MED ORDER — CHLORHEXIDINE GLUCONATE 0.12 % MT SOLN
15.0000 mL | Freq: Two times a day (BID) | OROMUCOSAL | Status: DC
  Administered 2019-01-03 – 2019-01-12 (×17): 15 mL via OROMUCOSAL
  Filled 2019-01-02 (×20): qty 15

## 2019-01-02 MED ORDER — INFLUENZA VAC SPLIT QUAD 0.5 ML IM SUSY
0.5000 mL | PREFILLED_SYRINGE | INTRAMUSCULAR | Status: AC
  Administered 2019-01-03: 0.5 mL via INTRAMUSCULAR
  Filled 2019-01-02: qty 0.5

## 2019-01-02 NOTE — ED Notes (Signed)
Pt placed on purewick 

## 2019-01-02 NOTE — Progress Notes (Signed)
PT Cancellation Note  Patient Details Name: KEARA BEBOUT MRN: [id number] DOB: [birth year]   Cancelled Treatment:    Reason Eval/Treat Not Completed: Other (comment); will await further imaging of L hip to rule out fracture prior to initiating PT.    Reginia Naas 01/02/2019, 8:52 AM  Magda Kiel, [place] [phone] 01/02/2019

## 2019-01-02 NOTE — Progress Notes (Signed)
Pt arrived from ED. Pt is from [facility]. Pt is not oriented, pt only rambles but is nonsensical. Pt has MASD to groin barrier cream applied and pt's sacrum is red but blanchable. Pt placed on high fall precautions, bed alarm turned on. Will continue to monitor pt. Ranelle Oyster, RN

## 2019-01-02 NOTE — ED Notes (Signed)
MRI aware of patient, they will get patient for test this am

## 2019-01-02 NOTE — Procedures (Signed)
Patient Name: Barbara MARCHEL  MRN: [id number]  Epilepsy Attending: Lora Havens  Referring Physician/Provider: Dr. Shela Leff Date: 01/02/2019  Duration: 25.52 minutes  Patient history: 63 year old female with altered mental status, Covid positive.  EEG to evaluate for seizures.  Level of alertness: Awake  AEDs during EEG study: None  Technical aspects: This EEG study was done with scalp electrodes positioned according to the 10-20 International system of electrode placement. Electrical activity was acquired at a sampling rate of 500Hz  and reviewed with a high frequency filter of 70Hz  and a low frequency filter of 1Hz . EEG data were recorded continuously and digitally stored.   Description: EEG showed continuous generalized rhythmic 2 to 5 Hz theta-delta slowing.  No clear posterior dominant rhythm was seen.  Hyperventilation photic solution were not performed.  Abnormality - Continuous rhythmic slow, generalized  IMPRESSION: This study is suggestive of moderate diffuse encephalopathy, nonspecific etiology. No seizures or epileptiform discharges were seen throughout the recording.

## 2019-01-02 NOTE — Progress Notes (Signed)
PROGRESS NOTE  Barbara Gill [id number] DOB: 1955/12/23 DOA: 01/01/2019 PCP: Marton Redwood, MD  HPI/Recap of past 70 hours: 63 year old female with past medical history significant for advanced Alzheimer's dementia, OSA, primary progressive aphasia, chronic depression, who presented from SNF due to behavioral change noted on the day of presentation.  She is minimally verbal at baseline with occasional nonsensical rambling.  At SNF they noticed she seemed to babble less.  Usually ambulatory with a walker or 1 person assist but was unable to get up and walk.  Brought into the ED for further evaluation.  COVID-19 positive on 01/01/2019.  Hypotensive.  Left hip pain on exam>>X-ray findings indeterminate for a nondisplaced subcapital fracture of the left femoral neck.  Left hip MRI ordered and pending.    CT head unremarkable for any acute intracranial findings.  MRI brain ordered and pending. Elevated CPK which is trending up>> started on IV fluid. O2 sat 99% RA, CXR negative for acute findings. Not started on Remdesivir or decadron, no indication at this time.   01/02/19: Patient was seen and examined in the ED.  She is alert but confused, rambles nonsensical words.  Bradycardic on monitor with heart rate in the 50s. TSH normal.  Assessment/Plan: Principal Problem:   Acute encephalopathy Active Problems:   COVID-19 virus infection   Hypotension   Hip tenderness, left   Sinus bradycardia   Acute metabolic encephalopathy likely in the setting of COVID-19 viral infection versus others CT head admission unremarkable for any acute intracranial findings MRI ordered and pending TSH, B12 normal Ammonia level within normal limits Reorient as needed UA not indicative of infection urine Culture pending  Acute COVID-19 viral infection Positive Covid-19 01/01/19 Independently reviewed chest x-ray done on admission which shows no lobular infiltrates O2 saturation 99% on room air No indication  for remdesivir or Decadron at this time Continue to closely monitor and maintain O2 saturation greater than 92% Start oral supplement vitamin C, D3, zinc Monitor fever curve  Left hip pain, rule out fracture X-ray findings indeterminate for a nondisplaced subcapital fracture of the left femoral neck.  Left hip MRI ordered and pending.    Sinus bradycardia: Unclear etiology Obtain twelve-lead EKG TSH normal  Mild elevated CPK Likely in the setting of left hip abnormality X-ray findings indeterminate for a nondisplaced subcapital fracture of the left femoral neck.  Left hip MRI ordered and pending.   CPK is trending up Currently on IV fluid normal saline at 150 cc/h, reduce rate of fluid for now due to acute COVID-19 viral infection Trend CPK and repeat levels in the morning Renal function is not affected  Alzheimer's dementia Resume home medications  Chronic depression/anxiety Resume home meds  Ambulatory dysfunction Fall Precautions    DVT prophylaxis: Lovenox subcu daily Code Status: Full code Family Communication:  None at bedside.   Disposition Plan:  Patient is currently not appropriate for discharge at this time due to ongoing evaluation of left hip pain, acute COVID-19 viral infection, ongoing acute metabolic encephalopathy of unclear etiology.  Patient will require at least 2 midnights for further evaluation and treatment of present condition.   Objective: Vitals:   01/02/19 0600 01/02/19 0653 01/02/19 0700 01/02/19 0800  BP: 127/70  119/60 133/66  Pulse:  (!) 50 (!) 46 (!) 52  Resp: 19 14 (!) 22 17  Temp:      TempSrc:      SpO2:  98% 93% 99%   No intake or output data in  the 24 hours ending 01/02/19 0949 There were no vitals filed for this visit.  Exam:   General: 63 y.o. year-old female well developed well nourished in no acute distress.  Alert and confused in the setting of advanced dementia.  Cardiovascular: Bradycardic with no rubs or gallops.  No  thyromegaly or JVD noted.    Respiratory: Clear to auscultation with no wheezes or rales.  Poor inspiratory effort.  Abdomen: Soft nontender nondistended with normal bowel sounds x4 quadrants.  Musculoskeletal: No lower extremity edema. 2/4 pulses in all 4 extremities.  Psychiatry: Mood is appropriate for condition and setting   Data Reviewed: CBC: Recent Labs  Lab 01/01/19 1723 01/01/19 1759 01/02/19 0225  WBC 3.6*  --  3.7*  NEUTROABS 2.4  --   --   HGB 15.2* 15.3* 13.3  HCT 47.8* 45.0 41.9  MCV 92.5  --  92.7  PLT 163  --  [id number]*   Basic Metabolic Panel: Recent Labs  Lab 01/01/19 1723 01/01/19 1759  NA 142 142  K 4.0 4.0  CL 105 106  CO2 25  --   GLUCOSE 91 89  BUN 17 19  CREATININE 0.98 0.90  CALCIUM 8.8*  --    GFR: CrCl cannot be calculated (Unknown ideal weight.). Liver Function Tests: Recent Labs  Lab 01/01/19 1723  AST 29  ALT 33  ALKPHOS 44  BILITOT 0.6  PROT 7.2  ALBUMIN 3.8   Recent Labs  Lab 01/01/19 1723  LIPASE 25   Recent Labs  Lab 01/02/19 0221  AMMONIA 16   Coagulation Profile: No results for input(s): INR, PROTIME in the last 168 hours. Cardiac Enzymes: Recent Labs  Lab 01/01/19 1723 01/02/19 0225  CKTOTAL 561* 653*   BNP (last 3 results) No results for input(s): PROBNP in the last 8760 hours. HbA1C: No results for input(s): HGBA1C in the last 72 hours. CBG: Recent Labs  Lab 01/01/19 1715  GLUCAP 77   Lipid Profile: No results for input(s): CHOL, HDL, LDLCALC, TRIG, CHOLHDL, LDLDIRECT in the last 72 hours. Thyroid Function Tests: Recent Labs    01/02/19 0221  TSH 3.147   Anemia Panel: Recent Labs    01/02/19 0221 01/02/19 0225  VITAMINB12 711  --   FERRITIN  --  237   Urine analysis:    Component Value Date/Time   COLORURINE YELLOW 01/01/2019 1730   APPEARANCEUR HAZY (A) 01/01/2019 1730   LABSPEC 1.029 01/01/2019 1730   PHURINE 5.0 01/01/2019 1730   GLUCOSEU NEGATIVE 01/01/2019 1730   HGBUR  NEGATIVE 01/01/2019 1730   BILIRUBINUR NEGATIVE 01/01/2019 1730   KETONESUR 20 (A) 01/01/2019 1730   PROTEINUR 30 (A) 01/01/2019 1730   NITRITE NEGATIVE 01/01/2019 1730   LEUKOCYTESUR NEGATIVE 01/01/2019 1730   Sepsis Labs: @LABRCNTIP (procalcitonin:4,lacticidven:4)  )No results found for this or any previous visit (from the past 240 hour(s)).    Studies: Dg Chest 1 View  Result Date: 01/01/2019 CLINICAL DATA:  Altered mental status, nystagmus, history hypertension, Alzheimer's EXAM: CHEST  1 VIEW COMPARISON:  Portable exam 1757 hours without priors for comparison FINDINGS: Normal heart size, mediastinal contours, and pulmonary vascularity. Lungs clear. No pleural effusion or pneumothorax. Bones unremarkable. IMPRESSION: No acute abnormalities. Electronically Signed   By: Lavonia Dana M.D.   On: 01/01/2019 18:33   Ct Head Wo Contrast  Result Date: 01/01/2019 CLINICAL DATA:  New onset altered mental status, unable to ambulate, history stroke, hypertension, Alzheimer's EXAM: CT HEAD WITHOUT CONTRAST TECHNIQUE: Contiguous axial images were obtained from  the base of the skull through the vertex without intravenous contrast. Sagittal and coronal MPR images reconstructed from axial data set. COMPARISON:  None FINDINGS: Brain: Generalized atrophy. Mild ex vacuo dilatation of the ventricular system. No midline shift or mass effect. Minimal small vessel chronic ischemic changes of deep cerebral white matter. No intracranial hemorrhage, mass lesion, or evidence of acute infarction. No extra-axial fluid collections. Vascular: No hyperdense vessels. Minimal atherosclerotic calcifications of internal carotid arteries at skull base Skull: Intact Sinuses/Orbits: Clear Other: N/A IMPRESSION: Atrophy with minimal small vessel chronic ischemic changes of deep cerebral white matter. No acute intracranial abnormalities. Electronically Signed   By: Lavonia Dana M.D.   On: 01/01/2019 19:52   Ct Abdomen Pelvis W  Contrast  Result Date: 01/01/2019 CLINICAL DATA:  Encephalopathy with abdominal distension and difficulty ambulating. EXAM: CT ABDOMEN AND PELVIS WITH CONTRAST TECHNIQUE: Multidetector CT imaging of the abdomen and pelvis was performed using the standard protocol following bolus administration of intravenous contrast. CONTRAST:  159mL OMNIPAQUE IOHEXOL 300 MG/ML  SOLN COMPARISON:  None. FINDINGS: LOWER CHEST: Small pleural effusions HEPATOBILIARY: Normal hepatic contours. No intra- or extrahepatic biliary dilatation. Normal gallbladder. PANCREAS: Normal pancreas. No ductal dilatation or peripancreatic fluid collection. SPLEEN: Normal. ADRENALS/URINARY TRACT: --Adrenal glands: Normal. --Kidneys and ureters: No hydronephrosis, nephroureterolithiasis or solid renal mass. --Urinary bladder: Normal for degree of distention STOMACH/BOWEL: --Stomach/Duodenum: No hiatal hernia. Normal duodenal course and caliber. --Small bowel: No dilatation or inflammation. --Colon: No focal abnormality. --Appendix: Not visualized. No right lower quadrant inflammation or free fluid. VASCULAR/LYMPHATIC: There is calcific atherosclerosis of the abdominal aorta. No abdominal or pelvic lymphadenopathy. REPRODUCTIVE: Normal uterus. No adnexal mass. MUSCULOSKELETAL. Grade 1 facetogenic anterolisthesis at L4-5. OTHER: None. IMPRESSION: 1. No acute abdominal or pelvic abnormality. 2. Small pleural effusions. 3. Aortic Atherosclerosis (ICD10-I70.0). Electronically Signed   By: Ulyses Jarred M.D.   On: 01/01/2019 19:59   Dg Hip Unilat W Or Wo Pelvis 2-3 Views Left  Result Date: 01/01/2019 CLINICAL DATA:  Fall and left hip pain EXAM: DG HIP (WITH OR WITHOUT PELVIS) 2-3V LEFT COMPARISON:  None. FINDINGS: Suspected cortical irregularity in the subcapital region of the left femoral neck seen on the AP view of the pelvis is indeterminate for a nondisplaced subcapital fracture. This apparent cortical irregularity is not seen on the other views of  the hip. There is no evidence of hip dislocation. Degenerative changes are seen in both hips and in the lumbar spine. IMPRESSION: 1. Findings indeterminate for a nondisplaced subcapital fracture of the left femoral neck. If there is clinical concern, MR or CT of the pelvis and left hip could be performed. Electronically Signed   By: Zerita Boers M.D.   On: 01/01/2019 18:35    Scheduled Meds:  enoxaparin (LOVENOX) injection  40 mg Subcutaneous Daily    Continuous Infusions:  sodium chloride 150 mL/hr at 01/02/19 0808     LOS: 0 days     Kayleen Memos, MD [facility] Pager [phone]  If 7PM-7AM, please contact night-coverage [email or web address] Password [facility] 01/02/2019, 9:49 AM

## 2019-01-02 NOTE — Progress Notes (Signed)
Received report from the ED. 

## 2019-01-02 NOTE — Evaluation (Signed)
Physical Therapy Evaluation Patient Details Name: Barbara Gill MRN: [id number] DOB: November 05, 1955 Today's Date: 01/02/2019   History of Present Illness  63 year old female with past medical history significant for advanced Alzheimer's dementia, OSA, primary progressive aphasia, chronic depression, who presented from SNF due to behavioral change.  Normally ambulatory with 1 person assist or walker, and pt would not ambulate.  Noted question hip fx  on x-ray, but MRI negative.  Covid +.  Clinical Impression  Patient presents with decreased mobility though she is unable to tell me her baseline.  She did not follow commands so needed max A for getting up to EOB and to stand, but once standing she was able to walk with shuffling steps in the room back and forth to the door with HHA x 2.  Feel she may benefit from skilled PT in the acute setting and follow up PT in facility following d/c.     Follow Up Recommendations SNF    Equipment Recommendations  None recommended by PT    Recommendations for Other Services       Precautions / Restrictions Precautions Precautions: Fall Precaution Comments: dementia      Mobility  Bed Mobility Overal bed mobility: Needs Assistance Bed Mobility: Supine to Sit;Sit to Supine     Supine to sit: Max assist;HOB elevated Sit to supine: Max assist   General bed mobility comments: assist for legs and trunk to sit and to supine  Transfers Overall transfer level: Needs assistance Equipment used: Rolling walker (2 wheeled);1 person hand held assist Transfers: Sit to/from Stand Sit to Stand: Max assist         General transfer comment: lifting help to get her on her feet, walker there and she held it once up on her feet with one hand only, so moved and used HHA only; stand to sit with mod A pt sits uncontrolled not real close to bed  Ambulation/Gait Ambulation/Gait assistance: Min assist Gait Distance (Feet): 20 Feet Assistive device: 1 person hand  held assist Gait Pattern/deviations: Step-to pattern;Shuffle;Decreased stride length;Wide base of support     General Gait Details: walked to door and back twice in the room with min A for balance, guidance and HHA. no c/o pain  Stairs            Wheelchair Mobility    Modified Rankin (Stroke Patients Only)       Balance Overall balance assessment: Needs assistance Sitting-balance support: Feet supported Sitting balance-Leahy Scale: Poor Sitting balance - Comments: can sit unsupported, but twice initiated LOB posteriorly with mod A to keep in sitting, then regains balance and sits with S     Standing balance-Leahy Scale: Poor Standing balance comment: one UE support for balance/safety                             Pertinent Vitals/Pain Pain Assessment: Faces Faces Pain Scale: Hurts little more Pain Location: moans when I assist her to sit up, but no localization and up on her feet without complaint Pain Descriptors / Indicators: Grimacing;Moaning Pain Intervention(s): Monitored during session;Repositioned    Home Living Family/patient expects to be discharged to:: Skilled nursing facility                      Prior Function Level of Independence: Needs assistance   Gait / Transfers Assistance Needed: per chart normally ambulates with one assist  Hand Dominance        Extremity/Trunk Assessment   Upper Extremity Assessment Upper Extremity Assessment: Difficult to assess due to impaired cognition(holds arms in tight, able to squeeze my hands tight, dificult to assess ROM)    Lower Extremity Assessment Lower Extremity Assessment: Difficult to assess due to impaired cognition(legs crossed in supine, some increased tone throughout)       Communication   Communication: Expressive difficulties(normally mumbles incoherently)  Cognition Arousal/Alertness: Awake/alert Behavior During Therapy: WFL for tasks  assessed/performed Overall Cognitive Status: History of cognitive impairments - at baseline                                 General Comments: mumbles throughout, needs manual assist as does not follow commands      General Comments      Exercises     Assessment/Plan    PT Assessment Patient needs continued PT services  PT Problem List Decreased mobility;Decreased balance;Decreased activity tolerance;Decreased knowledge of use of DME       PT Treatment Interventions DME instruction;Therapeutic activities;Balance training;Functional mobility training;Gait training;Neuromuscular re-education    PT Goals (Current goals can be found in the Care Plan section)  Acute Rehab PT Goals Patient Stated Goal: unable to state PT Goal Formulation: Patient unable to participate in goal setting Time For Goal Achievement: 01/16/19 Potential to Achieve Goals: Fair    Frequency Min 2X/week   Barriers to discharge        Co-evaluation               AM-PAC PT "6 Clicks" Mobility  Outcome Measure Help needed turning from your back to your side while in a flat bed without using bedrails?: A Lot Help needed moving from lying on your back to sitting on the side of a flat bed without using bedrails?: Total Help needed moving to and from a bed to a chair (including a wheelchair)?: Total Help needed standing up from a chair using your arms (e.g., wheelchair or bedside chair)?: Total Help needed to walk in hospital room?: A Lot Help needed climbing 3-5 steps with a railing? : Total 6 Click Score: 8    End of Session   Activity Tolerance: Patient tolerated treatment well Patient left: in bed;with call bell/phone within reach   PT Visit Diagnosis: Other symptoms and signs involving the nervous system (R29.898);Other abnormalities of gait and mobility (R26.89)    Time: [id number] PT Time Calculation (min) (ACUTE ONLY): 31 min   Charges:   PT Evaluation $PT Eval Moderate  Complexity: 1 Mod PT Treatments $Gait Training: 8-22 mins        Barbara Gill, [place] [facility] [phone] 01/02/2019\   Reginia Naas 01/02/2019, 4:28 PM

## 2019-01-02 NOTE — ED Notes (Signed)
Patient transported to MRI 

## 2019-01-02 NOTE — Progress Notes (Signed)
   Vital Signs MEWS/VS Documentation      01/02/2019 2244 01/02/2019 2245 01/02/2019 2256 01/02/2019 2300   MEWS Score:  0  1  2  2    MEWS Score Color:  Green  Green  Yellow  Yellow   Resp:  14  (!) 24  -  -   Pulse:  -  (!) 54  -  -   BP:  117/83  -  -  -   Temp:  -  -  (!) 101 F (38.3 C)  (!) 100.7 F (38.2 C)   O2 Device:  -  Room Air  -  -     Notified Baltazar Najjar, NP. Giving tylenol suppsitory for oral temp of 100.7 and axillary temp of 101. MEWS is yellow, 2.      Candace Gallus 01/02/2019,11:04 PM

## 2019-01-02 NOTE — Progress Notes (Signed)
Called and updated the patient's husband [name]. All questions answered to his satisfaction.

## 2019-01-02 NOTE — Progress Notes (Signed)
EEG completed, results pending. 

## 2019-01-03 ENCOUNTER — Inpatient Hospital Stay (HOSPITAL_COMMUNITY): Payer: PPO

## 2019-01-03 LAB — CBC WITH DIFFERENTIAL/PLATELET
Abs Immature Granulocytes: 0 10*3/uL (ref 0.00–0.07)
Basophils Absolute: 0 10*3/uL (ref 0.0–0.1)
Basophils Relative: 1 %
Eosinophils Absolute: 0 10*3/uL (ref 0.0–0.5)
Eosinophils Relative: 1 %
HCT: 40.1 % (ref 36.0–46.0)
Hemoglobin: 13.2 g/dL (ref 12.0–15.0)
Immature Granulocytes: 0 %
Lymphocytes Relative: 36 %
Lymphs Abs: 1.3 10*3/uL (ref 0.7–4.0)
MCH: 29.8 pg (ref 26.0–34.0)
MCHC: 32.9 g/dL (ref 30.0–36.0)
MCV: 90.5 fL (ref 80.0–100.0)
Monocytes Absolute: 0.6 10*3/uL (ref 0.1–1.0)
Monocytes Relative: 17 %
Neutro Abs: 1.7 10*3/uL (ref 1.7–7.7)
Neutrophils Relative %: 45 %
Platelets: 122 10*3/uL — ABNORMAL LOW (ref 150–400)
RBC: 4.43 MIL/uL (ref 3.87–5.11)
RDW: 12.5 % (ref 11.5–15.5)
WBC: 3.6 10*3/uL — ABNORMAL LOW (ref 4.0–10.5)
nRBC: 0 % (ref 0.0–0.2)

## 2019-01-03 LAB — CK: Total CK: 411 U/L — ABNORMAL HIGH (ref 38–234)

## 2019-01-03 LAB — URINE CULTURE: Culture: 20000 — AB

## 2019-01-03 LAB — COMPREHENSIVE METABOLIC PANEL
ALT: 28 U/L (ref 0–44)
AST: 27 U/L (ref 15–41)
Albumin: 3 g/dL — ABNORMAL LOW (ref 3.5–5.0)
Alkaline Phosphatase: 40 U/L (ref 38–126)
Anion gap: 12 (ref 5–15)
BUN: 9 mg/dL (ref 8–23)
CO2: 25 mmol/L (ref 22–32)
Calcium: 8.3 mg/dL — ABNORMAL LOW (ref 8.9–10.3)
Chloride: 105 mmol/L (ref 98–111)
Creatinine, Ser: 0.69 mg/dL (ref 0.44–1.00)
GFR calc Af Amer: >60 mL/min (ref >60)
GFR calc non Af Amer: >60 mL/min (ref >60)
Glucose, Bld: 65 mg/dL — ABNORMAL LOW (ref 70–99)
Potassium: 3.6 mmol/L (ref 3.5–5.1)
Sodium: 142 mmol/L (ref 135–145)
Total Bilirubin: 0.9 mg/dL (ref 0.3–1.2)
Total Protein: 5.7 g/dL — ABNORMAL LOW (ref 6.5–8.1)

## 2019-01-03 LAB — RAPID URINE DRUG SCREEN, HOSP PERFORMED
Amphetamines: NOT DETECTED
Barbiturates: NOT DETECTED
Benzodiazepines: NOT DETECTED
Cocaine: NOT DETECTED
Opiates: NOT DETECTED
Tetrahydrocannabinol: NOT DETECTED

## 2019-01-03 LAB — MRSA PCR SCREENING: MRSA by PCR: NEGATIVE

## 2019-01-03 LAB — C-REACTIVE PROTEIN: CRP: 2.9 mg/dL — ABNORMAL HIGH (ref <1.0)

## 2019-01-03 MED ORDER — DEXTROSE 10 % IV SOLN
INTRAVENOUS | Status: AC
  Administered 2019-01-03: 06:00:00 via INTRAVENOUS

## 2019-01-03 MED ORDER — FOSFOMYCIN TROMETHAMINE 3 G PO PACK
3.0000 g | PACK | Freq: Once | ORAL | Status: AC
  Administered 2019-01-03: 3 g via ORAL
  Filled 2019-01-03: qty 3

## 2019-01-03 NOTE — Evaluation (Signed)
Clinical/Bedside Swallow Evaluation Patient Details  Name: JAGGER WOTEN MRN: [id number] Date of Birth: [birth year]  Today's Date: 01/03/2019 Time: SLP Start Time (ACUTE ONLY): 0900 SLP Stop Time (ACUTE ONLY): 0920 SLP Time Calculation (min) (ACUTE ONLY): 20 min  Past Medical History:  Past Medical History:  Diagnosis Date  . Alzheimer's disease ([place])   . High cholesterol   . Hyperlipemia   . Hypertension    Past Surgical History: History reviewed. No pertinent surgical history. HPI:  63 year old female with past medical history significant for advanced Alzheimer's dementia, OSA, primary progressive aphasia, chronic depression, who presented from SNF due to behavioral change noted on the day of presentation.  She is minimally verbal at baseline with occasional nonsensical rambling.  At SNF they noticed she seemed to babble less.  Usually ambulatory with a walker or 1 person assist but was unable to get up and walk.  Brought into the ED for further evaluation.  COVID-19 positive on 01/01/2019.  Hypotensive.  Left hip pain on exam>>X-ray findings indeterminate for a nondisplaced subcapital fracture of the left femoral neck   Assessment / Plan / Recommendation Clinical Impression  Pt demonstrates primary cognitive impairment due to dementia and PPA with receptive and expressive language impairment. Pt does not immediately recognize PO and does not self feed, resists hand over hand assist. With gestures, agreeable prosody and max visual cues to improve pts awareness pt gradually began to allow total assist feeding. Initially she did not appropraitely seal lips to cup and had immediate cough response with poorly controlled sip (indicating strong sensation to aspiration). Progressed from small teaspoons, to siphon from straw to eventual automatic consecutive straw sips without further impairment. Pt also then able to take bites of puree and solids. She can be easily startled and defensive. Careful  feeding is needed. Posted advice to staff in room, will f/u for tolerance of mechanical soft diet and thin liquids.  SLP Visit Diagnosis: Dysphagia, oral phase (R13.11)    Aspiration Risk  Mild aspiration risk    Diet Recommendation Dysphagia 3 (Mech soft);Thin liquid   Liquid Administration via: Straw Medication Administration: Crushed with puree Supervision: Full supervision/cueing for compensatory strategies Compensations: Slow rate;Small sips/bites Postural Changes: Remain upright for at least 30 minutes after po intake;Seated upright at 90 degrees    Other  Recommendations     Follow up Recommendations Skilled Nursing facility      Frequency and Duration min 2x/week  2 weeks       Prognosis Prognosis for Safe Diet Advancement: Good Barriers to Reach Goals: Cognitive deficits      Swallow Study   General HPI: 62 year old female with past medical history significant for advanced Alzheimer's dementia, OSA, primary progressive aphasia, chronic depression, who presented from SNF due to behavioral change noted on the day of presentation.  She is minimally verbal at baseline with occasional nonsensical rambling.  At SNF they noticed she seemed to babble less.  Usually ambulatory with a walker or 1 person assist but was unable to get up and walk.  Brought into the ED for further evaluation.  COVID-19 positive on 01/01/2019.  Hypotensive.  Left hip pain on exam>>X-ray findings indeterminate for a nondisplaced subcapital fracture of the left femoral neck Type of Study: Bedside Swallow Evaluation Diet Prior to this Study: NPO Temperature Spikes Noted: No Respiratory Status: Room air History of Recent Intubation: No Behavior/Cognition: Alert;Requires cueing;Doesn't follow directions Oral Cavity Assessment: Within Functional Limits Oral Care Completed by SLP: No Oral Cavity -  Dentition: Adequate natural dentition Self-Feeding Abilities: Total assist Patient Positioning: Upright in  bed Baseline Vocal Quality: Normal Volitional Cough: Cognitively unable to elicit Volitional Swallow: Unable to elicit    Oral/Motor/Sensory Function Overall Oral Motor/Sensory Function: Other (comment)(does not follow commands, seems WNL)   Ice Chips Ice chips: Within functional limits Presentation: Spoon   Thin Liquid Thin Liquid: Impaired Presentation: Spoon;Cup;Straw Oral Phase Impairments: Poor awareness of bolus Pharyngeal  Phase Impairments: Cough - Immediate    Nectar Thick Nectar Thick Liquid: Not tested   Honey Thick Honey Thick Liquid: Not tested   Puree Puree: Within functional limits   Solid     Solid: Within functional limits     Herbie Baltimore, MA [place] Pager [phone] Office [phone]  Othelia Pulling, Katherene Ponto 01/03/2019,10:46 AM

## 2019-01-03 NOTE — Evaluation (Addendum)
Occupational Therapy Evaluation Patient Details Name: Barbara Gill MRN: [id number] DOB: February 11, 1955 Today's Date: 01/03/2019    History of Present Illness 63 year old female with past medical history significant for advanced Alzheimer's dementia, OSA, primary progressive aphasia, chronic depression, who presented from SNF due to behavioral change.  Normally ambulatory with 1 person assist or walker, and pt would not ambulate.  Noted question hip fx  on x-ray, but MRI negative.  Covid +.   Clinical Impression   This 63 y/o female presents with the above. Per chart pt was ambulatory PTA. Pt presents supine in bed today awake/alert; pt overall mumbling nonsensical speech during session (per chart likely baseline), no command following noted at this time. Pt intermittently smiling at therapist however is resistive to most movements including bil UE ROM and repositioning in bed. Pt currently requiring totalA for bed mobility and ADL completion. VSS throughout on RA. Pt will benefit from continued acute OT services to progress her towards her PLOF. Currently recommend SNF level therapies at time of discharge (unless [facility] able to provide necessary assist at pt's current level). Will follow.     Follow Up Recommendations  SNF;Supervision/Assistance - 24 hour(vs back to [facility], pending available assist)    Equipment Recommendations  Other (comment)(defer to next venue)           Precautions / Restrictions Precautions Precautions: Fall Precaution Comments: dementia Restrictions Weight Bearing Restrictions: No      Mobility Bed Mobility Overal bed mobility: Needs Assistance             General bed mobility comments: totalA for repositioning in bed, pt crying out with truncal movement, tends to lean L  Transfers                 General transfer comment: deferred     Balance                                           ADL either performed  or assessed with clinical judgement   ADL Overall ADL's : Needs assistance/impaired                                       General ADL Comments: pt currently totalA for ADL, attempted hand over hand for simple grooming ADL but with no initiation or attempt for carryover                         Pertinent Vitals/Pain Pain Assessment: Faces Faces Pain Scale: Hurts even more Pain Location: pt crying out with attempt to reposition trunk with mobility, also with some arm movements  Pain Descriptors / Indicators: Grimacing;Crying;Moaning Pain Intervention(s): Limited activity within patient's tolerance;Monitored during session;Repositioned     Hand Dominance     Extremity/Trunk Assessment Upper Extremity Assessment Upper Extremity Assessment: Generalized weakness;Difficult to assess due to impaired cognition(pt resistive to attempts at ROM)   Lower Extremity Assessment Lower Extremity Assessment: Defer to PT evaluation       Communication Communication Communication: Expressive difficulties(normally mumbles incoherently)   Cognition Arousal/Alertness: Awake/alert Behavior During Therapy: WFL for tasks assessed/performed Overall Cognitive Status: History of cognitive impairments - at baseline  General Comments: mumbling throughout session, intermittently smiles at therapist, no command following noted   General Comments       Exercises     Shoulder Instructions      Home Living Family/patient expects to be discharged to:: Skilled nursing facility                                        Prior Functioning/Environment Level of Independence: Needs assistance  Gait / Transfers Assistance Needed: per chart normally ambulates with one assist ADL's / Homemaking Assistance Needed: suspect required some assist Communication / Swallowing Assistance Needed: mostly nonverbal at baseline           OT Problem List: Decreased strength;Decreased range of motion;Decreased activity tolerance;Impaired balance (sitting and/or standing);Decreased cognition;Decreased safety awareness;Decreased knowledge of use of DME or AE      OT Treatment/Interventions: Self-care/ADL training;Therapeutic exercise;DME and/or AE instruction;Therapeutic activities;Patient/family education;Balance training;Cognitive remediation/compensation    OT Goals(Current goals can be found in the care plan section) Acute Rehab OT Goals Patient Stated Goal: unable to state OT Goal Formulation: Patient unable to participate in goal setting Time For Goal Achievement: 01/17/19 Potential to Achieve Goals: Good  OT Frequency: Min 2X/week   Barriers to D/C:            Co-evaluation              AM-PAC OT "6 Clicks" Daily Activity     Outcome Measure Help from another person eating meals?: Total Help from another person taking care of personal grooming?: Total Help from another person toileting, which includes using toliet, bedpan, or urinal?: Total Help from another person bathing (including washing, rinsing, drying)?: Total Help from another person to put on and taking off regular upper body clothing?: Total Help from another person to put on and taking off regular lower body clothing?: Total 6 Click Score: 6   End of Session Nurse Communication: Mobility status  Activity Tolerance: Patient tolerated treatment well Patient left: in bed;with bed alarm set;with call bell/phone within reach  OT Visit Diagnosis: Muscle weakness (generalized) (M62.81);Other abnormalities of gait and mobility (R26.89);Other symptoms and signs involving cognitive function                Time: [id number] OT Time Calculation (min): 16 min Charges:  OT General Charges $OT Visit: 1 Visit OT Evaluation $OT Eval Moderate Complexity: 1 Mod  Lou Cal, OT [employer] Pager [phone] Office  [phone]   Raymondo Band 01/03/2019, 4:23 PM

## 2019-01-03 NOTE — Progress Notes (Addendum)
   Vital Signs MEWS/VS Documentation      01/03/2019 0845 01/03/2019 1313 01/03/2019 1900 01/03/2019 2022   MEWS Score:  1  1  1  2    MEWS Score Color:  Nyoka Cowden  Green  Green  Yellow   Resp:  -  18  -  19   Pulse:  -  65  -  81   BP:  -  99/66  -  118/73   Temp:  -  98.9 F (37.2 C)  -  (!) 102 F (38.9 C)   O2 Device:  -  -  -  Room Air   Level of Consciousness:  Alert  -  -  -       Notified Baltazar Najjar, NP that pt's MEWS is yellow, 2. Fever is 102.0. Gave tylenol suppository.NP ordered chest xray and blood cultures. Will continue to monitor pt.     Candace Gallus 01/03/2019,8:37 PM

## 2019-01-03 NOTE — TOC Initial Note (Addendum)
Transition of Care [facility]) - Initial/Assessment Note    Patient Details  Name: Barbara Gill MRN: [id number] Date of Birth: 1955/11/25  Transition of [facility]) CM/SW Contact:    Benard Halsted, [place] Phone Number: 01/03/2019, 10:32 AM  Clinical Narrative:                 10:32am-CSW spoke with patient's spouse regarding PT recommendation of SNF placement. Patient's spouse hopeful that patient can return to [facility] ALF, but reports understanding that patient may require SNF if [facility] is unable to accept patient back. Per spouse, patient did not ambulate much before admission and is non-verbal. CSW awaiting call back from [facility].   11:40am-CSW spoke with Helene Kelp, RN, at [facility]. She reports that patient was not very ambulatory at baseline. CSW discussed PT and speech therapy notes with her. She requested CSW send over clinicals for review (f. [phone]) but that they should be able to accept patient back as long as she isn't on oxygen or IVs.   Expected Discharge Plan: Assisted Living Barriers to Discharge: Continued Medical Work up   Patient Goals and CMS Choice Patient states their goals for this hospitalization and ongoing recovery are:: Return to ALF CMS Medicare.gov Compare Post Acute Care list provided to:: Patient Represenative (must comment)(Spouse) Choice offered to / list presented to : Spouse  Expected Discharge Plan and Services Expected Discharge Plan: Assisted Living In-house Referral: Clinical Social Work Discharge Planning Services: NA   Living arrangements for the past 2 months: [place]                 DME Arranged: N/A                    Prior Living Arrangements/Services Living arrangements for the past 2 months: [place] Lives with:: Facility Resident Patient language and need for interpreter reviewed:: Yes Do you feel safe going back to the place where you live?: Yes      Need for  Family Participation in Patient Care: Yes (Comment) Care giver support system in place?: Yes (comment)   Criminal Activity/Legal Involvement Pertinent to Current Situation/Hospitalization: No - Comment as needed  Activities of Daily Living      Permission Sought/Granted Permission sought to share information with : Facility Sport and exercise psychologist, Family Supports Permission granted to share information with : No  Share Information with NAME: [name]  Permission granted to share info w AGENCY: [facility]  Permission granted to share info w Relationship: Spouse  Permission granted to share info w Contact Information: [phone]  Emotional Assessment Appearance:: Appears stated age Attitude/Demeanor/Rapport: Unable to Assess Affect (typically observed): Unable to Assess Orientation: : (Disoriented x4) Alcohol / Substance Use: Not Applicable Psych Involvement: No (comment)  Admission diagnosis:  Pain [R52] COVID-19 [U07.1] Acute encephalopathy [G93.40] Patient Active Problem List   Diagnosis Date Noted  . COVID-19 virus infection 01/01/2019  . Acute encephalopathy 01/01/2019  . Hypotension 01/01/2019  . Hip tenderness, left 01/01/2019  . Sinus bradycardia 01/01/2019  . Diabetes ([place]) 11/08/2015  . Alzheimer's disease ([place]) 11/08/2015  . OSA (obstructive sleep apnea) 11/08/2015  . Nicotine dependence 11/08/2015  . Hyperlipemia 11/08/2015  . ADD (attention deficit disorder) 11/08/2015  . Generalized anxiety disorder 11/08/2015  . Depression 11/08/2015   PCP:  Marton Redwood, MD Pharmacy:  No Pharmacies Listed    Social Determinants of Health (SDOH) Interventions    Readmission Risk Interventions No flowsheet data found.

## 2019-01-03 NOTE — Progress Notes (Signed)
PROGRESS NOTE  [place] Barbara Gill [id number] DOB: 1955-06-24 DOA: 01/01/2019 PCP: Marton Redwood, MD  HPI/Recap of past 13 hours: 63 year old female with past medical history significant for advanced Alzheimer's dementia, OSA, primary progressive aphasia, chronic depression, who presented from SNF due to behavioral change noted on the day of presentation.  She is minimally verbal at baseline with occasional nonsensical rambling.  At SNF they noticed she seemed to babble less.  Usually ambulatory with a walker or 1 person assist but was unable to get up and walk.  Brought into the ED for further evaluation.  COVID-19 positive on 01/01/2019.  Hypotensive.  Left hip pain on exam>>X-ray findings indeterminate for a nondisplaced subcapital fracture of the left femoral neck.  Left hip MRI ordered and pending.    CT head unremarkable for any acute intracranial findings.  MRI brain ordered and pending. Elevated CPK which is trending up>> started on IV fluid. O2 sat 99% RA, CXR negative for acute findings. Not started on Remdesivir or decadron, no indication at this time.   01/03/19: Seen and examined.  More alert today but confused in a state of advanced dementia and rambling nonsensical words.    Speech therapist has been consulted to assess for ability to safely swallow and tolerate nutrition by mouth.  Started on D10W at 30 cc/h to avoid hypoglycemia in the setting of n.p.o.    T-max of 101 last night.  O2 saturation in the mid 90s on room air.  No evidence of lobular infiltrates on admission chest x-ray.  Trending inflammatory markers.   Assessment/Plan: Principal Problem:   Acute encephalopathy Active Problems:   COVID-19 virus infection   Hypotension   Hip tenderness, left   Sinus bradycardia   Acute metabolic encephalopathy likely in the setting of COVID-19 viral infection CT head admission unremarkable for any acute intracranial findings MRI brain unremarkable for any acute intracranial  findings TSH, B12 normal Ammonia level within normal limits UA not indicative of infection urine Culture 20,000 colonies of E. Coli.  Will give 1 dose of fosfomycin.  Acute COVID-19 viral infection Positive Covid-19 01/01/19 Independently reviewed chest x-ray done on admission which shows no lobular infiltrates O2 saturation 96% on room air from 99% on admission T-max 101 overnight Continue to closely monitor for any changes Continue oral supplement vitamin C, D3 and zinc. Continue to monitor fever curve  Presumed uncomplicated UTI Urine culture 20,000 E. coli colonies with intermediate ampicillin Source of confusion? AMS? Provide 1 dose of fosfomycin.  Left hip pain, ruled out fracture X-ray findings indeterminate for a nondisplaced subcapital fracture of the left femoral neck.  Left hip MRI no evidence of fracture   Leukopenia/thrombocytopenia likely secondary to COVID-19 viral infection Continue to monitor  Sinus bradycardia: Unclear etiology Heart rate in the 50s Personally reviewed twelve-lead EKG done on 01/02/2019, sinus bradycardia rate of 51. TSH normal  Mild elevated CPK CPK peaked in the 650's Trending down Renal function intact  Alzheimer's dementia Resume home medications  Chronic depression/anxiety Resume home meds  Ambulatory dysfunction Continue fall Precautions PT OT assessment     DVT prophylaxis: Lovenox subcu daily Code Status: Full code Family Communication:  None at bedside.   Disposition Plan:  Possible DC back to SNF once mental status is close to baseline.  Objective: Vitals:   01/03/19 0204 01/03/19 0400 01/03/19 0600 01/03/19 0800  BP:  106/68 107/64 118/62  Pulse: (!) 58 (!) 51 (!) 54 (!) 55  Resp: 16 15 (!) 9 (!)  22  Temp:  99.2 F (37.3 C)  99.1 F (37.3 C)  TempSrc:  Axillary  Axillary  SpO2: 96% 93% 97% 95%  Weight:      Height:        Intake/Output Summary (Last 24 hours) at 01/03/2019 [id number] Last data filed at  01/03/2019 [id number] Gross per 24 hour  Intake 1036.12 ml  Output 50 ml  Net 986.12 ml   Filed Weights   01/02/19 2209  Weight: 81.1 kg    Exam:   General: 63 y.o. year-old female Well developed well nourished alert but confused in a state of dementia.  Cardiovascular: Bradycardic with no rubs or gallops.  No thyromegaly or JVD noted.    Respiratory: Clear to auscultation no wheezes or rales.  Poor inspiratory effort.    Abdomen: Soft nontender monitor normal bowel sounds.  Musculoskeletal: No lower extremity edema.    Psychiatry: Mood is appropriate condition and setting.   Data Reviewed: CBC: Recent Labs  Lab 01/01/19 1723 01/01/19 1759 01/02/19 0225 01/03/19 0550  WBC 3.6*  --  3.7* 3.6*  NEUTROABS 2.4  --   --  1.7  HGB 15.2* 15.3* 13.3 13.2  HCT 47.8* 45.0 41.9 40.1  MCV 92.5  --  92.7 90.5  PLT 163  --  147* 123XX123*   Basic Metabolic Panel: Recent Labs  Lab 01/01/19 1723 01/01/19 1759 01/03/19 0550  NA 142 142 142  K 4.0 4.0 3.6  CL 105 106 105  CO2 25  --  25  GLUCOSE 91 89 65*  BUN 17 19 9   CREATININE 0.98 0.90 0.69  CALCIUM 8.8*  --  8.3*   GFR: Estimated Creatinine Clearance: 74.5 mL/min (by C-G formula based on SCr of 0.69 mg/dL). Liver Function Tests: Recent Labs  Lab 01/01/19 1723 01/03/19 0550  AST 29 27  ALT 33 28  ALKPHOS 44 40  BILITOT 0.6 0.9  PROT 7.2 5.7*  ALBUMIN 3.8 3.0*   Recent Labs  Lab 01/01/19 1723  LIPASE 25   Recent Labs  Lab 01/02/19 0221  AMMONIA 16   Coagulation Profile: No results for input(s): INR, PROTIME in the last 168 hours. Cardiac Enzymes: Recent Labs  Lab 01/01/19 1723 01/02/19 0225 01/03/19 0550  CKTOTAL 561* 653* 411*   BNP (last 3 results) No results for input(s): PROBNP in the last 8760 hours. HbA1C: No results for input(s): HGBA1C in the last 72 hours. CBG: Recent Labs  Lab 01/01/19 1715  GLUCAP 77   Lipid Profile: No results for input(s): CHOL, HDL, LDLCALC, TRIG, CHOLHDL,  LDLDIRECT in the last 72 hours. Thyroid Function Tests: Recent Labs    01/02/19 0221  TSH 3.147   Anemia Panel: Recent Labs    01/02/19 0221 01/02/19 0225  VITAMINB12 711  --   FERRITIN  --  237   Urine analysis:    Component Value Date/Time   COLORURINE YELLOW 01/01/2019 1730   APPEARANCEUR HAZY (A) 01/01/2019 1730   LABSPEC 1.029 01/01/2019 1730   PHURINE 5.0 01/01/2019 1730   GLUCOSEU NEGATIVE 01/01/2019 1730   HGBUR NEGATIVE 01/01/2019 1730   BILIRUBINUR NEGATIVE 01/01/2019 1730   KETONESUR 20 (A) 01/01/2019 1730   PROTEINUR 30 (A) 01/01/2019 1730   NITRITE NEGATIVE 01/01/2019 1730   LEUKOCYTESUR NEGATIVE 01/01/2019 1730   Sepsis Labs: @LABRCNTIP (procalcitonin:4,lacticidven:4)  ) Recent Results (from the past 240 hour(s))  Urine culture     Status: Abnormal   Collection Time: 01/01/19  5:23 PM   Specimen: Urine, Random  Result Value Ref Range Status   Specimen Description URINE, RANDOM  Final   Special Requests   Final    NONE Performed at [facility], [address] [place], [place] [address]    Culture 20,000 COLONIES/mL ESCHERICHIA COLI (A)  Final   Report Status 01/03/2019 FINAL  Final   Organism ID, Bacteria ESCHERICHIA COLI (A)  Final      Susceptibility   Escherichia coli - MIC*    AMPICILLIN 16 INTERMEDIATE Intermediate     CEFAZOLIN <=4 SENSITIVE Sensitive     CEFTRIAXONE <=1 SENSITIVE Sensitive     CIPROFLOXACIN <=0.25 SENSITIVE Sensitive     GENTAMICIN <=1 SENSITIVE Sensitive     IMIPENEM <=0.25 SENSITIVE Sensitive     NITROFURANTOIN <=16 SENSITIVE Sensitive     TRIMETH/SULFA <=20 SENSITIVE Sensitive     AMPICILLIN/SULBACTAM 4 SENSITIVE Sensitive     PIP/TAZO <=4 SENSITIVE Sensitive     Extended ESBL NEGATIVE Sensitive     * 20,000 COLONIES/mL ESCHERICHIA COLI  MRSA PCR Screening     Status: None   Collection Time: 01/02/19 10:27 PM   Specimen: Nasal Mucosa; Nasopharyngeal  Result Value Ref Range Status   MRSA by PCR NEGATIVE  NEGATIVE Final    Comment:        The GeneXpert MRSA Assay (FDA approved for NASAL specimens only), is one component of a comprehensive MRSA colonization surveillance program. It is not intended to diagnose MRSA infection nor to guide or monitor treatment for MRSA infections. Performed at [facility], [place] [address], [place], [place] [address]       Studies: Mr Brain 46 Contrast  Result Date: 01/02/2019 CLINICAL DATA:  63 year old female with new onset altered mental status. Encephalopathy. Alzheimer's disease. EXAM: MRI HEAD WITHOUT CONTRAST TECHNIQUE: Multiplanar, multiecho pulse sequences of the brain and surrounding structures were obtained without intravenous contrast. COMPARISON:  Head CT yesterday. FINDINGS: Brain: No restricted diffusion to suggest acute infarction. No midline shift, mass effect, evidence of mass lesion, extra-axial collection or acute intracranial hemorrhage. Cervicomedullary junction within normal limits. Partially empty sella. However, there is severe generalized cerebral hemisphere atrophy which appears symmetric (series 10, image 16). Associated ventricular enlargement appears to be ex vacuo. No superimposed ischemic encephalomalacia or chronic cerebral blood products identified. Signal in the deep gray nuclei, brainstem and cerebellum remains normal. Brainstem and cerebellar volume is also better preserved. Vascular: Major intracranial vascular flow voids are preserved. Skull and upper cervical spine: Negative visible cervical spine. Visualized bone marrow signal is within normal limits. Sinuses/Orbits: Negative orbits. Mild to moderate bilateral paranasal sinus mucosal thickening. No sinus fluid level. Other: Mastoids are clear. Visible internal auditory structures appear normal. Scalp and face soft tissues appear negative. IMPRESSION: 1. No acute intracranial abnormality. 2. Severe generalized cerebral atrophy probably related to advanced Alzheimer's  disease. 3. Mild paranasal sinus inflammation. Electronically Signed   By: Genevie Ann M.D.   On: 01/02/2019 09:52   Mr Hip Left Wo Contrast  Result Date: 01/02/2019 CLINICAL DATA:  Left hip tenderness on physical examination. Difficulty walking. Suspected hip fracture. EXAM: MR OF THE LEFT HIP WITHOUT CONTRAST TECHNIQUE: Multiplanar, multisequence MR imaging was performed. No intravenous contrast was administered. COMPARISON:  Radiographs and pelvic CT 01/01/2019. FINDINGS: Despite efforts by the technologist and patient, mild motion artifact is present on today's exam and could not be eliminated. This reduces exam sensitivity and specificity. Bones: There is no evidence of acute fracture, dislocation or avascular necrosis. The visualized bony  pelvis appears normal. The visualized sacroiliac joints and symphysis pubis appear normal. Articular cartilage and labrum Articular cartilage: Mild symmetric degenerative changes of both hips. Labrum: There is no gross labral tear or paralabral abnormality. Joint or bursal effusion Joint effusion: No significant hip joint effusion. Bursae: No focal periarticular fluid collection. Muscles and tendons Muscles and tendons: The visualized gluteus, hamstring and iliopsoas tendons appear normal. The piriformis muscles appear symmetric. Other findings Miscellaneous: A small amount of free pelvic fluid is within physiologic limits. The visualized internal pelvic contents otherwise appear normal. IMPRESSION: 1. No acute findings or explanation for the patient's symptoms. No evidence of left femoral neck fracture. 2. Mild symmetric degenerative changes of both hips. Electronically Signed   By: Richardean Sale M.D.   On: 01/02/2019 10:33    Scheduled Meds:  chlorhexidine  15 mL Mouth Rinse BID   enoxaparin (LOVENOX) injection  40 mg Subcutaneous Daily   influenza vac split quadrivalent PF  0.5 mL Intramuscular Tomorrow-1000   mouth rinse  15 mL Mouth Rinse q12n4p    pneumococcal 23 valent vaccine  0.5 mL Intramuscular Tomorrow-1000    Continuous Infusions:  dextrose 30 mL/hr at 01/03/19 0542     LOS: 1 day     Kayleen Memos, MD [facility] Pager [phone]  If 7PM-7AM, please contact night-coverage [email or web address] Password [facility] 01/03/2019, 9:38 AM

## 2019-01-03 NOTE — Progress Notes (Signed)
CSW spoke with Network engineer at [facility] and requested patient's RN contact CSW back to discuss plan. Patient will likely need SNF rehab per PT.  Percell Locus Barbara Sanderlin LCSW [phone]

## 2019-01-04 LAB — FERRITIN: Ferritin: 390 ng/mL — ABNORMAL HIGH (ref 11–307)

## 2019-01-04 LAB — COMPREHENSIVE METABOLIC PANEL
ALT: 33 U/L (ref 0–44)
AST: 40 U/L (ref 15–41)
Albumin: 3.1 g/dL — ABNORMAL LOW (ref 3.5–5.0)
Alkaline Phosphatase: 40 U/L (ref 38–126)
Anion gap: 12 (ref 5–15)
BUN: 10 mg/dL (ref 8–23)
CO2: 24 mmol/L (ref 22–32)
Calcium: 8.2 mg/dL — ABNORMAL LOW (ref 8.9–10.3)
Chloride: 101 mmol/L (ref 98–111)
Creatinine, Ser: 0.69 mg/dL (ref 0.44–1.00)
GFR calc Af Amer: >60 mL/min (ref >60)
GFR calc non Af Amer: >60 mL/min (ref >60)
Glucose, Bld: 89 mg/dL (ref 70–99)
Potassium: 3.6 mmol/L (ref 3.5–5.1)
Sodium: 137 mmol/L (ref 135–145)
Total Bilirubin: 0.7 mg/dL (ref 0.3–1.2)
Total Protein: 5.8 g/dL — ABNORMAL LOW (ref 6.5–8.1)

## 2019-01-04 LAB — CBC WITH DIFFERENTIAL/PLATELET
Abs Immature Granulocytes: 0.01 10*3/uL (ref 0.00–0.07)
Basophils Absolute: 0 10*3/uL (ref 0.0–0.1)
Basophils Relative: 0 %
Eosinophils Absolute: 0 10*3/uL (ref 0.0–0.5)
Eosinophils Relative: 0 %
HCT: 39.6 % (ref 36.0–46.0)
Hemoglobin: 13.3 g/dL (ref 12.0–15.0)
Immature Granulocytes: 0 %
Lymphocytes Relative: 30 %
Lymphs Abs: 1 10*3/uL (ref 0.7–4.0)
MCH: 29.7 pg (ref 26.0–34.0)
MCHC: 33.6 g/dL (ref 30.0–36.0)
MCV: 88.4 fL (ref 80.0–100.0)
Monocytes Absolute: 0.4 10*3/uL (ref 0.1–1.0)
Monocytes Relative: 12 %
Neutro Abs: 1.8 10*3/uL (ref 1.7–7.7)
Neutrophils Relative %: 58 %
Platelets: 111 10*3/uL — ABNORMAL LOW (ref 150–400)
RBC: 4.48 MIL/uL (ref 3.87–5.11)
RDW: 12.6 % (ref 11.5–15.5)
WBC: 3.2 10*3/uL — ABNORMAL LOW (ref 4.0–10.5)
nRBC: 0 % (ref 0.0–0.2)

## 2019-01-04 LAB — LACTATE DEHYDROGENASE: LDH: 222 U/L — ABNORMAL HIGH (ref 98–192)

## 2019-01-04 LAB — D-DIMER, QUANTITATIVE: D-Dimer, Quant: 0.68 ug/mL-FEU — ABNORMAL HIGH (ref 0.00–0.50)

## 2019-01-04 LAB — C-REACTIVE PROTEIN: CRP: 3.2 mg/dL — ABNORMAL HIGH (ref <1.0)

## 2019-01-04 LAB — FIBRINOGEN: Fibrinogen: 481 mg/dL — ABNORMAL HIGH (ref 210–475)

## 2019-01-04 NOTE — Progress Notes (Signed)
  Speech Language Pathology Treatment: Dysphagia  Patient Details Name: Barbara Gill MRN: [id number] DOB: 04-Mar-1955 Today's Date: 01/04/2019 Time: [id number] SLP Time Calculation (min) (ACUTE ONLY): 10 min  Assessment / Plan / Recommendation Clinical Impression  Pt even more responsive to PO, still required total assist feeding, but less cueing, very automatic. No signs of aspiration. Continue current diet. No SLP f/u needed currently, will sign off.   HPI HPI: 63 year old female with past medical history significant for advanced Alzheimer's dementia, OSA, primary progressive aphasia, chronic depression, who presented from SNF due to behavioral change noted on the day of presentation.  She is minimally verbal at baseline with occasional nonsensical rambling.  At SNF they noticed she seemed to babble less.  Usually ambulatory with a walker or 1 person assist but was unable to get up and walk.  Brought into the ED for further evaluation.  COVID-19 positive on 01/01/2019.  Hypotensive.  Left hip pain on exam>>X-ray findings indeterminate for a nondisplaced subcapital fracture of the left femoral neck      SLP Plan  Continue with current plan of care       Recommendations  Diet recommendations: Regular;Thin liquid Liquids provided via: Cup;Straw Medication Administration: Crushed with puree Supervision: Staff to assist with self feeding Compensations: Minimize environmental distractions Postural Changes and/or Swallow Maneuvers: Seated upright 90 degrees                Plan: Continue with current plan of care       GO               Herbie Baltimore, MA CCC-SLP  Acute Rehabilitation Services Pager [phone] Office [phone]  Lynann Beaver 01/04/2019, 12:03 PM

## 2019-01-04 NOTE — Progress Notes (Signed)
PROGRESS NOTE  Barbara Gill [id number] DOB: 1955/10/04 DOA: 01/01/2019 PCP: Marton Redwood, MD  HPI/Recap of past 35 hours: 63 year old female with past medical history significant for advanced Alzheimer's dementia, OSA, primary progressive aphasia, chronic depression, who presented from SNF due to behavioral change noted on the day of presentation.  She is minimally verbal at baseline with occasional nonsensical rambling.  At SNF they noticed she seemed to babble less.  Usually ambulatory with a walker or 1 person assist but was unable to get up and walk.  Brought into the ED for further evaluation.  COVID-19 positive on 01/01/2019.  Hypotensive.  Left hip pain on exam>>X-ray findings indeterminate for a nondisplaced subcapital fracture of the left femoral neck.  Left hip MRI ordered and pending.    CT head unremarkable for any acute intracranial findings.  MRI brain ordered and pending. Elevated CPK which is trending up>> started on IV fluid. O2 sat 99% RA, CXR negative for acute findings. Not started on Remdesivir or decadron, no indication at this time.   Speech therapist following.  Recommendation for regular thin liquid diet. T-max 102 overnight.  Lactic acid and procalcitonin negative.  Chest x-ray negative.  OT recommends SNF.  01/04/19: Seen and examined.  Babbles nonsensical words.   Assessment/Plan: Principal Problem:   Acute encephalopathy Active Problems:   COVID-19 virus infection   Hypotension   Hip tenderness, left   Sinus bradycardia   Acute metabolic encephalopathy likely in the setting of COVID-19 viral infection CT head admission unremarkable for any acute intracranial findings MRI brain unremarkable for any acute intracranial findings TSH, B12 normal Ammonia level within normal limits UA not indicative of infection urine Culture 20,000 colonies of E. Coli.  Received 1 dose of fosfomycin.  Acute COVID-19 viral infection Positive Covid-19 01/01/19  Independently reviewed chest x-ray done on admission which shows no lobular infiltrates O2 saturation 94% on room air from 99% on admission T-max 102 overnight. Continue oral supplement vitamin C, D3 and zinc. Continue to monitor fever curve Blood cultures negative to date.  Presumed uncomplicated UTI Urine culture 20,000 E. coli colonies with intermediate ampicillin Source of confusion? AMS? Provided 1 dose of fosfomycin on 01/03/2019.  Left hip pain, ruled out fracture X-ray findings indeterminate for a nondisplaced subcapital fracture of the left femoral neck.  Left hip MRI no evidence of fracture   Worsening leukopenia/thrombocytopenia likely secondary to COVID-19 viral infection Continue to monitor  Sinus bradycardia: Unclear etiology Heart rate in the 50s Personally reviewed twelve-lead EKG done on 01/02/2019, sinus bradycardia rate of 51. TSH normal  Mild elevated CPK CPK is trending down  Alzheimer's dementia Continue home medications  Chronic depression/anxiety Continue home meds  Ambulatory dysfunction Continue fall Precautions OT recommended SNF     DVT prophylaxis: Lovenox subcu daily Code Status: Full code Family Communication:  None at bedside.   Disposition Plan:  Possible DC back to SNF once fever has improved.   Objective: Vitals:   01/04/19 0400 01/04/19 0502 01/04/19 0610 01/04/19 1200  BP: (!) 112/98 (!) 112/98 102/77 116/66  Pulse: 62 64 63 68  Resp: (!) 22  17 19   Temp:  (!) 100.6 F (38.1 C)  99 F (37.2 C)  TempSrc:  Axillary  Axillary  SpO2: 92% 95% 93% 90%  Weight:      Height:        Intake/Output Summary (Last 24 hours) at 01/04/2019 1505 Last data filed at 01/04/2019 0523 Gross per 24 hour  Intake 576.18  ml  Output 250 ml  Net 326.18 ml   Filed Weights   01/02/19 2209  Weight: 81.1 kg    Exam:  . General: 63 y.o. year-old female cardiovascular normal no acute distress.  Alert and confused.  Advanced dementia. .  Cardiovascular: Regular rate and rhythm no rubs or gallops.   Marland Kitchen Respiratory: Clear to auscultation no wheezes or rales.  Poor inspiratory effort. . Abdomen: Soft nontender nondistended no bowel sounds.  Musculoskeletal: No lower extremity edema. Psychiatry: Mood is appropriate for condition and setting.   Data Reviewed: CBC: Recent Labs  Lab 01/01/19 1723 01/01/19 1759 01/02/19 0225 01/03/19 0550 01/04/19 0635  WBC 3.6*  --  3.7* 3.6* 3.2*  NEUTROABS 2.4  --   --  1.7 1.8  HGB 15.2* 15.3* 13.3 13.2 13.3  HCT 47.8* 45.0 41.9 40.1 39.6  MCV 92.5  --  92.7 90.5 88.4  PLT 163  --  147* 122* [id number]*   Basic Metabolic Panel: Recent Labs  Lab 01/01/19 1723 01/01/19 1759 01/03/19 0550 01/04/19 0635  NA 142 142 142 137  K 4.0 4.0 3.6 3.6  CL 105 106 105 101  CO2 25  --  25 24  GLUCOSE 91 89 65* 89  BUN 17 19 9 10   CREATININE 0.98 0.90 0.69 0.69  CALCIUM 8.8*  --  8.3* 8.2*   GFR: Estimated Creatinine Clearance: 74.5 mL/min (by C-G formula based on SCr of 0.69 mg/dL). Liver Function Tests: Recent Labs  Lab 01/01/19 1723 01/03/19 0550 01/04/19 0635  AST 29 27 40  ALT 33 28 33  ALKPHOS 44 40 40  BILITOT 0.6 0.9 0.7  PROT 7.2 5.7* 5.8*  ALBUMIN 3.8 3.0* 3.1*   Recent Labs  Lab 01/01/19 1723  LIPASE 25   Recent Labs  Lab 01/02/19 0221  AMMONIA 16   Coagulation Profile: No results for input(s): INR, PROTIME in the last 168 hours. Cardiac Enzymes: Recent Labs  Lab 01/01/19 1723 01/02/19 0225 01/03/19 0550  CKTOTAL 561* 653* 411*   BNP (last 3 results) No results for input(s): PROBNP in the last 8760 hours. HbA1C: No results for input(s): HGBA1C in the last 72 hours. CBG: Recent Labs  Lab 01/01/19 1715  GLUCAP 77   Lipid Profile: No results for input(s): CHOL, HDL, LDLCALC, TRIG, CHOLHDL, LDLDIRECT in the last 72 hours. Thyroid Function Tests: Recent Labs    01/02/19 0221  TSH 3.147   Anemia Panel: Recent Labs    01/02/19 0221 01/02/19 0225  01/04/19 0635  VITAMINB12 711  --   --   FERRITIN  --  237 390*   Urine analysis:    Component Value Date/Time   COLORURINE YELLOW 01/01/2019 1730   APPEARANCEUR HAZY (A) 01/01/2019 1730   LABSPEC 1.029 01/01/2019 1730   PHURINE 5.0 01/01/2019 1730   GLUCOSEU NEGATIVE 01/01/2019 1730   HGBUR NEGATIVE 01/01/2019 1730   BILIRUBINUR NEGATIVE 01/01/2019 1730   KETONESUR 20 (A) 01/01/2019 1730   PROTEINUR 30 (A) 01/01/2019 1730   NITRITE NEGATIVE 01/01/2019 1730   LEUKOCYTESUR NEGATIVE 01/01/2019 1730   Sepsis Labs: @LABRCNTIP (procalcitonin:4,lacticidven:4)  ) Recent Results (from the past 240 hour(s))  Urine culture     Status: Abnormal   Collection Time: 01/01/19  5:23 PM   Specimen: Urine, Random  Result Value Ref Range Status   Specimen Description URINE, RANDOM  Final   Special Requests   Final    NONE Performed at [facility], [place] [address] [place], [place] [address]  Culture 20,000 COLONIES/mL ESCHERICHIA COLI (A)  Final   Report Status 01/03/2019 FINAL  Final   Organism ID, Bacteria ESCHERICHIA COLI (A)  Final      Susceptibility   Escherichia coli - MIC*    AMPICILLIN 16 INTERMEDIATE Intermediate     CEFAZOLIN <=4 SENSITIVE Sensitive     CEFTRIAXONE <=1 SENSITIVE Sensitive     CIPROFLOXACIN <=0.25 SENSITIVE Sensitive     GENTAMICIN <=1 SENSITIVE Sensitive     IMIPENEM <=0.25 SENSITIVE Sensitive     NITROFURANTOIN <=16 SENSITIVE Sensitive     TRIMETH/SULFA <=20 SENSITIVE Sensitive     AMPICILLIN/SULBACTAM 4 SENSITIVE Sensitive     PIP/TAZO <=4 SENSITIVE Sensitive     Extended ESBL NEGATIVE Sensitive     * 20,000 COLONIES/mL ESCHERICHIA COLI  MRSA PCR Screening     Status: None   Collection Time: 01/02/19 10:27 PM   Specimen: Nasal Mucosa; Nasopharyngeal  Result Value Ref Range Status   MRSA by PCR NEGATIVE NEGATIVE Final    Comment:        The GeneXpert MRSA Assay (FDA approved for NASAL specimens only), is one component of a comprehensive  MRSA colonization surveillance program. It is not intended to diagnose MRSA infection nor to guide or monitor treatment for MRSA infections. Performed at [facility], [place] [address], [place], [place] [address]   Culture, blood (routine x 2)     Status: None (Preliminary result)   Collection Time: 01/03/19  9:10 PM   Specimen: BLOOD  Result Value Ref Range Status   Specimen Description BLOOD RIGHT HAND  Final   Special Requests   Final    BOTTLES DRAWN AEROBIC AND ANAEROBIC Blood Culture adequate volume   Culture   Final    NO GROWTH < 12 HOURS Performed at [facility], [place] [address] [place], [place] [address]    Report Status PENDING  Incomplete  Culture, blood (routine x 2)     Status: None (Preliminary result)   Collection Time: 01/03/19  9:18 PM   Specimen: BLOOD  Result Value Ref Range Status   Specimen Description BLOOD RIGHT HAND  Final   Special Requests   Final    BOTTLES DRAWN AEROBIC AND ANAEROBIC Blood Culture results may not be optimal due to an inadequate volume of blood received in culture bottles   Culture   Final    NO GROWTH < 12 HOURS Performed at [facility], [place] [address] [place], [place] [address]    Report Status PENDING  Incomplete      Studies: Dg Chest Port 1 View  Result Date: 01/03/2019 CLINICAL DATA:  Altered mental status, fever, COVID-19 positive EXAM: PORTABLE CHEST 1 VIEW COMPARISON:  Radiograph 01/01/2019 FINDINGS: No consolidation, features of edema, pneumothorax, or effusion. Pulmonary vascularity is normally distributed. The cardiomediastinal contours are unremarkable. No acute osseous or soft tissue abnormality. IMPRESSION: No acute cardiopulmonary abnormality. Electronically Signed   By: Lovena Le M.D.   On: 01/03/2019 21:40    Scheduled Meds: . chlorhexidine  15 mL Mouth Rinse BID  . enoxaparin (LOVENOX) injection  40 mg Subcutaneous Daily  . mouth rinse  15 mL Mouth Rinse q12n4p    Continuous Infusions:     LOS: 2 days     Kayleen Memos, MD [facility] Pager [phone]  If 7PM-7AM, please contact night-coverage [email or web address] Password [email or web address] 01/04/2019, 3:05 PM

## 2019-01-05 LAB — COMPREHENSIVE METABOLIC PANEL
ALT: 49 U/L — ABNORMAL HIGH (ref 0–44)
AST: 54 U/L — ABNORMAL HIGH (ref 15–41)
Albumin: 2.9 g/dL — ABNORMAL LOW (ref 3.5–5.0)
Alkaline Phosphatase: 38 U/L (ref 38–126)
Anion gap: 9 (ref 5–15)
BUN: 10 mg/dL (ref 8–23)
CO2: 27 mmol/L (ref 22–32)
Calcium: 8.3 mg/dL — ABNORMAL LOW (ref 8.9–10.3)
Chloride: 103 mmol/L (ref 98–111)
Creatinine, Ser: 0.95 mg/dL (ref 0.44–1.00)
GFR calc Af Amer: >60 mL/min (ref >60)
GFR calc non Af Amer: >60 mL/min (ref >60)
Glucose, Bld: 97 mg/dL (ref 70–99)
Potassium: 3.5 mmol/L (ref 3.5–5.1)
Sodium: 139 mmol/L (ref 135–145)
Total Bilirubin: 0.6 mg/dL (ref 0.3–1.2)
Total Protein: 5.7 g/dL — ABNORMAL LOW (ref 6.5–8.1)

## 2019-01-05 LAB — CBC WITH DIFFERENTIAL/PLATELET
Abs Immature Granulocytes: 0.01 10*3/uL (ref 0.00–0.07)
Basophils Absolute: 0 10*3/uL (ref 0.0–0.1)
Basophils Relative: 0 %
Eosinophils Absolute: 0 10*3/uL (ref 0.0–0.5)
Eosinophils Relative: 0 %
HCT: 40.9 % (ref 36.0–46.0)
Hemoglobin: 13.6 g/dL (ref 12.0–15.0)
Immature Granulocytes: 0 %
Lymphocytes Relative: 42 %
Lymphs Abs: 1.3 10*3/uL (ref 0.7–4.0)
MCH: 29.2 pg (ref 26.0–34.0)
MCHC: 33.3 g/dL (ref 30.0–36.0)
MCV: 88 fL (ref 80.0–100.0)
Monocytes Absolute: 0.4 10*3/uL (ref 0.1–1.0)
Monocytes Relative: 12 %
Neutro Abs: 1.5 10*3/uL — ABNORMAL LOW (ref 1.7–7.7)
Neutrophils Relative %: 46 %
Platelets: 127 10*3/uL — ABNORMAL LOW (ref 150–400)
RBC: 4.65 MIL/uL (ref 3.87–5.11)
RDW: 12.6 % (ref 11.5–15.5)
WBC: 3.2 10*3/uL — ABNORMAL LOW (ref 4.0–10.5)
nRBC: 0 % (ref 0.0–0.2)

## 2019-01-05 LAB — LACTATE DEHYDROGENASE: LDH: 250 U/L — ABNORMAL HIGH (ref 98–192)

## 2019-01-05 LAB — FIBRINOGEN: Fibrinogen: 482 mg/dL — ABNORMAL HIGH (ref 210–475)

## 2019-01-05 LAB — FERRITIN: Ferritin: 438 ng/mL — ABNORMAL HIGH (ref 11–307)

## 2019-01-05 LAB — C-REACTIVE PROTEIN: CRP: 3.6 mg/dL — ABNORMAL HIGH (ref <1.0)

## 2019-01-05 LAB — D-DIMER, QUANTITATIVE: D-Dimer, Quant: 0.67 ug/mL-FEU — ABNORMAL HIGH (ref 0.00–0.50)

## 2019-01-05 NOTE — Progress Notes (Signed)
PROGRESS NOTE  Barbara Gill [id number] DOB: [birth year] DOA: 01/01/2019 PCP: Marton Redwood, MD  HPI/Recap of past 11 hours: 63 year old female with past medical history significant for advanced Alzheimer's dementia, OSA, primary progressive aphasia, chronic depression, who presented from SNF due to behavioral change noted on the day of presentation.  She is minimally verbal at baseline with occasional nonsensical rambling.  At SNF they noticed she seemed to babble less.  Usually ambulatory with a walker or 1 person assist but was unable to get up and walk.  Brought into the ED for further evaluation.  COVID-19 positive on 01/01/2019.  Hypotensive.  Left hip pain on exam>>X-ray findings indeterminate for a nondisplaced subcapital fracture of the left femoral neck.  Left hip MRI ordered and pending.    CT head unremarkable for any acute intracranial findings.  MRI brain ordered and pending. Elevated CPK which is trending up>> started on IV fluid. O2 sat 99% RA, CXR negative for acute findings. Not started on Remdesivir or decadron, no indication at this time.   Speech therapist following.  Recommendation for regular thin liquid diet. T-max 102 overnight.  Lactic acid and procalcitonin negative.  Chest x-ray negative.  OT recommends SNF.  01/05/19: Seen and examined. Babbles. Does not appear in distress. O2 sat 92% RA.   Assessment/Plan: Principal Problem:   Acute encephalopathy Active Problems:   COVID-19 virus infection   Hypotension   Hip tenderness, left   Sinus bradycardia   Acute metabolic encephalopathy likely in the setting of COVID-19 viral infection CT head admission unremarkable for any acute intracranial findings MRI brain unremarkable for any acute intracranial findings TSH, B12 normal Ammonia level within normal limits UA not indicative of infection urine Culture 20,000 colonies of E. Coli.  Received 1 dose of fosfomycin.  Acute COVID-19 viral infection Positive  Covid-19 01/01/19 Independently reviewed chest x-ray done on admission which shows no lobular infiltrates O2 saturation 92% on room air from 99% on admission Continue oral supplement vitamin C, D3 and zinc. Continue to monitor fever curve Blood cultures negative to date.  Presumed uncomplicated UTI Urine culture 20,000 E. coli colonies with intermediate ampicillin Source of confusion? AMS? Provided 1 dose of fosfomycin on 01/03/2019.  Left hip pain, ruled out fracture  X-ray findings indeterminate for a nondisplaced subcapital fracture of the left femoral neck.  Left hip MRI no evidence of fracture   Worsening leukopenia/thrombocytopenia likely secondary to COVID-19 viral infection Continue to monitor  Resolved Sinus bradycardia: Unclear etiology HR now 60-80's TSH normal  Mild elevated CPK CPK is trending down  Alzheimer's dementia Continue home medications  Chronic depression/anxiety Continue home meds  Ambulatory dysfunction Continue fall Precautions OT recommended SNF     DVT prophylaxis: Lovenox subcu daily Code Status: Full code Family Communication:  None at bedside.   Disposition Plan:  Possible DC back to SNF when bed is available   Objective: Vitals:   01/05/19 0304 01/05/19 0424 01/05/19 0800 01/05/19 1400  BP:  93/62 113/61 (!) 123/100  Pulse: 71 64 64 68  Resp: 13 (!) 21 20 14   Temp: 98.7 F (37.1 C) 98.5 F (36.9 C) 99 F (37.2 C) 99.4 F (37.4 C)  TempSrc: Axillary Axillary Oral Axillary  SpO2: 97% 96% 92% 91%  Weight:  81.2 kg    Height:        Intake/Output Summary (Last 24 hours) at 01/05/2019 1608 Last data filed at 01/05/2019 1526 Gross per 24 hour  Intake 350 ml  Output 800  ml  Net -450 ml   Filed Weights   01/02/19 2209 01/05/19 0424  Weight: 81.1 kg 81.2 kg    Exam:  . General: 63 y.o. year-old female Well developed well nourished in NAD. Alert and babbles. Does not use distinguishable words. . Cardiovascular: RRR no  rubs or gallops. Marland Kitchen Respiratory: Clear to auscultation. No wheezes or rales. . Abdomen: Soft NT NBS Musculoskeletal: No LE bilaterally. Psychiatry: Mood appropriate for condition.  Data Reviewed: CBC: Recent Labs  Lab 01/01/19 1723 01/01/19 1759 01/02/19 0225 01/03/19 0550 01/04/19 0635 01/05/19 0500  WBC 3.6*  --  3.7* 3.6* 3.2* 3.2*  NEUTROABS 2.4  --   --  1.7 1.8 1.5*  HGB 15.2* 15.3* 13.3 13.2 13.3 13.6  HCT 47.8* 45.0 41.9 40.1 39.6 40.9  MCV 92.5  --  92.7 90.5 88.4 88.0  PLT 163  --  147* 122* 111* [id number]   Basic Metabolic Panel: Recent Labs  Lab 01/01/19 1723 01/01/19 1759 01/03/19 0550 01/04/19 0635 01/05/19 0500  NA 142 142 142 137 139  K 4.0 4.0 3.6 3.6 3.5  CL 105 106 105 101 103  CO2 25  --  25 24 27   GLUCOSE 91 89 65* 89 97  BUN 17 19 9 10 10   CREATININE 0.98 0.90 0.69 0.69 0.95  CALCIUM 8.8*  --  8.3* 8.2* 8.3*   GFR: Estimated Creatinine Clearance: 62.7 mL/min (by C-G formula based on SCr of 0.95 mg/dL). Liver Function Tests: Recent Labs  Lab 01/01/19 1723 01/03/19 0550 01/04/19 0635 01/05/19 0500  AST 29 27 40 54*  ALT 33 28 33 49*  ALKPHOS 44 40 40 38  BILITOT 0.6 0.9 0.7 0.6  PROT 7.2 5.7* 5.8* 5.7*  ALBUMIN 3.8 3.0* 3.1* 2.9*   Recent Labs  Lab 01/01/19 1723  LIPASE 25   Recent Labs  Lab 01/02/19 0221  AMMONIA 16   Coagulation Profile: No results for input(s): INR, PROTIME in the last 168 hours. Cardiac Enzymes: Recent Labs  Lab 01/01/19 1723 01/02/19 0225 01/03/19 0550  CKTOTAL 561* 653* 411*   BNP (last 3 results) No results for input(s): PROBNP in the last 8760 hours. HbA1C: No results for input(s): HGBA1C in the last 72 hours. CBG: Recent Labs  Lab 01/01/19 1715  GLUCAP 77   Lipid Profile: No results for input(s): CHOL, HDL, LDLCALC, TRIG, CHOLHDL, LDLDIRECT in the last 72 hours. Thyroid Function Tests: No results for input(s): TSH, T4TOTAL, FREET4, T3FREE, THYROIDAB in the last 72 hours. Anemia  Panel: Recent Labs    01/04/19 0635 01/05/19 0500  FERRITIN 390* 438*   Urine analysis:    Component Value Date/Time   COLORURINE YELLOW 01/01/2019 1730   APPEARANCEUR HAZY (A) 01/01/2019 1730   LABSPEC 1.029 01/01/2019 1730   PHURINE 5.0 01/01/2019 1730   GLUCOSEU NEGATIVE 01/01/2019 1730   HGBUR NEGATIVE 01/01/2019 1730   BILIRUBINUR NEGATIVE 01/01/2019 1730   KETONESUR 20 (A) 01/01/2019 1730   PROTEINUR 30 (A) 01/01/2019 1730   NITRITE NEGATIVE 01/01/2019 1730   LEUKOCYTESUR NEGATIVE 01/01/2019 1730   Sepsis Labs: @LABRCNTIP (procalcitonin:4,lacticidven:4)  ) Recent Results (from the past 240 hour(s))  Urine culture     Status: Abnormal   Collection Time: 01/01/19  5:23 PM   Specimen: Urine, Random  Result Value Ref Range Status   Specimen Description URINE, RANDOM  Final   Special Requests   Final    NONE Performed at [facility], [place] [address], [place], [place] [address]  Culture 20,000 COLONIES/mL ESCHERICHIA COLI (A)  Final   Report Status 01/03/2019 FINAL  Final   Organism ID, Bacteria ESCHERICHIA COLI (A)  Final      Susceptibility   Escherichia coli - MIC*    AMPICILLIN 16 INTERMEDIATE Intermediate     CEFAZOLIN <=4 SENSITIVE Sensitive     CEFTRIAXONE <=1 SENSITIVE Sensitive     CIPROFLOXACIN <=0.25 SENSITIVE Sensitive     GENTAMICIN <=1 SENSITIVE Sensitive     IMIPENEM <=0.25 SENSITIVE Sensitive     NITROFURANTOIN <=16 SENSITIVE Sensitive     TRIMETH/SULFA <=20 SENSITIVE Sensitive     AMPICILLIN/SULBACTAM 4 SENSITIVE Sensitive     PIP/TAZO <=4 SENSITIVE Sensitive     Extended ESBL NEGATIVE Sensitive     * 20,000 COLONIES/mL ESCHERICHIA COLI  MRSA PCR Screening     Status: None   Collection Time: 01/02/19 10:27 PM   Specimen: Nasal Mucosa; Nasopharyngeal  Result Value Ref Range Status   MRSA by PCR NEGATIVE NEGATIVE Final    Comment:        The GeneXpert MRSA Assay (FDA approved for NASAL specimens only), is one component of  a comprehensive MRSA colonization surveillance program. It is not intended to diagnose MRSA infection nor to guide or monitor treatment for MRSA infections. Performed at [facility], [place] [address], [place], [place] [address]   Culture, blood (routine x 2)     Status: None (Preliminary result)   Collection Time: 01/03/19  9:10 PM   Specimen: BLOOD  Result Value Ref Range Status   Specimen Description BLOOD RIGHT HAND  Final   Special Requests   Final    BOTTLES DRAWN AEROBIC AND ANAEROBIC Blood Culture adequate volume   Culture   Final    NO GROWTH 1 DAY Performed at [facility], [place] [address] [place], [place] [address]    Report Status PENDING  Incomplete  Culture, blood (routine x 2)     Status: None (Preliminary result)   Collection Time: 01/03/19  9:18 PM   Specimen: BLOOD  Result Value Ref Range Status   Specimen Description BLOOD RIGHT HAND  Final   Special Requests   Final    BOTTLES DRAWN AEROBIC AND ANAEROBIC Blood Culture results may not be optimal due to an inadequate volume of blood received in culture bottles   Culture   Final    NO GROWTH 1 DAY Performed at [facility], [place] [address], [place], [place] [address]    Report Status PENDING  Incomplete      Studies: No results found.  Scheduled Meds: . chlorhexidine  15 mL Mouth Rinse BID  . enoxaparin (LOVENOX) injection  40 mg Subcutaneous Daily  . mouth rinse  15 mL Mouth Rinse q12n4p    Continuous Infusions:    LOS: 3 days     Kayleen Memos, MD [facility] Pager [phone]  If 7PM-7AM, please contact night-coverage [email or web address] Password [email or web address] 01/05/2019, 4:08 PM

## 2019-01-05 NOTE — Progress Notes (Signed)
Physical Therapy Treatment Patient Details Name: Barbara Gill MRN: [id number] DOB: 11-Apr-1955 Today's Date: 01/05/2019    History of Present Illness 63 year old female with past medical history significant for advanced Alzheimer's dementia, OSA, primary progressive aphasia, chronic depression, who presented from SNF due to behavioral change.  Normally ambulatory with 1 person assist or walker, and pt would not ambulate.  Noted question hip fx  on x-ray, but MRI negative.  Covid +.    PT Comments    Pt limited in safe mobility due to decreased cognition, mumbling occasionally and with expressive facial expressions. Pt requires total A to come to EoB today, where she was able to statically sit for at least 10 minutes. Pt given maximal tactile and high visual cues including playing music and invitation to dance. On one occasion pt started to stand but stopped. Any physical assist to come to standing was met with resistance. Given pt cognitive state d/c plans remain appropriate at this time. PT will continue to follow acutely to try to progress mobility.    Follow Up Recommendations  SNF     Equipment Recommendations  None recommended by PT       Precautions / Restrictions Precautions Precautions: Fall Precaution Comments: dementia Restrictions Weight Bearing Restrictions: No    Mobility  Bed Mobility Overal bed mobility: Needs Assistance Bed Mobility: Supine to Sit;Sit to Supine     Supine to sit: HOB elevated;Total assist Sit to supine: Total assist   General bed mobility comments: assist for LE management off/on bed, and trunk to upright/back to bed  Transfers Overall transfer level: Needs assistance     Sit to Stand: Total assist         General transfer comment: unable to get pt to stand either with high visual cuing or with physical assist        Balance Overall balance assessment: Needs assistance Sitting-balance support: Feet supported Sitting  balance-Leahy Scale: Fair Sitting balance - Comments: able to sit unsupported for 10 minutes before increased posterior lean                                     Cognition Arousal/Alertness: Awake/alert Behavior During Therapy: WFL for tasks assessed/performed Overall Cognitive Status: History of cognitive impairments - at baseline                                 General Comments: mumbles throughout, needs manual assist as does not follow commands         General Comments General comments (skin integrity, edema, etc.): VSS, SaO2 90%O2 on RA      Pertinent Vitals/Pain Pain Assessment: Faces Faces Pain Scale: No hurt           PT Goals (current goals can now be found in the care plan section) Acute Rehab PT Goals Patient Stated Goal: unable to state PT Goal Formulation: Patient unable to participate in goal setting Time For Goal Achievement: 01/16/19 Potential to Achieve Goals: Fair Progress towards PT goals: Not progressing toward goals - comment(limited by dementia in ability to participate)    Frequency    Min 2X/week      PT Plan Current plan remains appropriate       AM-PAC PT "6 Clicks" Mobility   Outcome Measure  Help needed turning from your back to your side while in  a flat bed without using bedrails?: A Lot Help needed moving from lying on your back to sitting on the side of a flat bed without using bedrails?: Total Help needed moving to and from a bed to a chair (including a wheelchair)?: Total Help needed standing up from a chair using your arms (e.g., wheelchair or bedside chair)?: Total Help needed to walk in hospital room?: Total Help needed climbing 3-5 steps with a railing? : Total 6 Click Score: 7    End of Session Equipment Utilized During Treatment: Gait belt Activity Tolerance: Patient tolerated treatment well Patient left: in bed;with call bell/phone within reach Nurse Communication: Mobility status PT Visit  Diagnosis: Other symptoms and signs involving the nervous system (R29.898);Other abnormalities of gait and mobility (R26.89)     Time: 6153-7943 PT Time Calculation (min) (ACUTE ONLY): 30 min  Charges:  $Therapeutic Activity: 23-37 mins                     Saje Gallop B. Migdalia Dk PT, DPT Acute Rehabilitation Services Pager [phone] Office [phone]    [place] 01/05/2019, 2:37 PM

## 2019-01-05 NOTE — Progress Notes (Signed)
SATURATION QUALIFICATIONS: (This note is used to comply with regulatory documentation for home oxygen)  Patient Saturations on Room Air at Rest = 92%  Patient Saturations on Room Air while Ambulating = %  Patient Saturations on  Liters of oxygen while Ambulating = %  Please briefly explain why patient needs home oxygen: Patient unable to ambulate. Sitting at the edge of the bed her Sat O2 was 90 on RA. Resting in bed sat O2 92%.

## 2019-01-06 ENCOUNTER — Inpatient Hospital Stay (HOSPITAL_COMMUNITY): Payer: PPO

## 2019-01-06 LAB — COMPREHENSIVE METABOLIC PANEL
ALT: 64 U/L — ABNORMAL HIGH (ref 0–44)
AST: 52 U/L — ABNORMAL HIGH (ref 15–41)
Albumin: 3.1 g/dL — ABNORMAL LOW (ref 3.5–5.0)
Alkaline Phosphatase: 41 U/L (ref 38–126)
Anion gap: 11 (ref 5–15)
BUN: 9 mg/dL (ref 8–23)
CO2: 27 mmol/L (ref 22–32)
Calcium: 8.5 mg/dL — ABNORMAL LOW (ref 8.9–10.3)
Chloride: 102 mmol/L (ref 98–111)
Creatinine, Ser: 0.7 mg/dL (ref 0.44–1.00)
GFR calc Af Amer: >60 mL/min (ref >60)
GFR calc non Af Amer: >60 mL/min (ref >60)
Glucose, Bld: 91 mg/dL (ref 70–99)
Potassium: 3.4 mmol/L — ABNORMAL LOW (ref 3.5–5.1)
Sodium: 140 mmol/L (ref 135–145)
Total Bilirubin: 0.9 mg/dL (ref 0.3–1.2)
Total Protein: 6 g/dL — ABNORMAL LOW (ref 6.5–8.1)

## 2019-01-06 LAB — CBC WITH DIFFERENTIAL/PLATELET
Abs Immature Granulocytes: 0.01 10*3/uL (ref 0.00–0.07)
Basophils Absolute: 0 10*3/uL (ref 0.0–0.1)
Basophils Relative: 1 %
Eosinophils Absolute: 0 10*3/uL (ref 0.0–0.5)
Eosinophils Relative: 0 %
HCT: 41.2 % (ref 36.0–46.0)
Hemoglobin: 13.9 g/dL (ref 12.0–15.0)
Immature Granulocytes: 0 %
Lymphocytes Relative: 30 %
Lymphs Abs: 1.2 10*3/uL (ref 0.7–4.0)
MCH: 29.4 pg (ref 26.0–34.0)
MCHC: 33.7 g/dL (ref 30.0–36.0)
MCV: 87.3 fL (ref 80.0–100.0)
Monocytes Absolute: 0.4 10*3/uL (ref 0.1–1.0)
Monocytes Relative: 10 %
Neutro Abs: 2.2 10*3/uL (ref 1.7–7.7)
Neutrophils Relative %: 59 %
Platelets: 136 10*3/uL — ABNORMAL LOW (ref 150–400)
RBC: 4.72 MIL/uL (ref 3.87–5.11)
RDW: 12.4 % (ref 11.5–15.5)
WBC: 3.8 10*3/uL — ABNORMAL LOW (ref 4.0–10.5)
nRBC: 0 % (ref 0.0–0.2)

## 2019-01-06 LAB — MAGNESIUM: Magnesium: 1.9 mg/dL (ref 1.7–2.4)

## 2019-01-06 LAB — FERRITIN: Ferritin: 496 ng/mL — ABNORMAL HIGH (ref 11–307)

## 2019-01-06 LAB — C-REACTIVE PROTEIN: CRP: 4.4 mg/dL — ABNORMAL HIGH (ref <1.0)

## 2019-01-06 LAB — LACTATE DEHYDROGENASE: LDH: 280 U/L — ABNORMAL HIGH (ref 98–192)

## 2019-01-06 MED ORDER — POTASSIUM CHLORIDE 20 MEQ/15ML (10%) PO SOLN
40.0000 meq | Freq: Once | ORAL | Status: AC
  Administered 2019-01-06: 40 meq via ORAL
  Filled 2019-01-06: qty 30

## 2019-01-06 MED ORDER — SODIUM CHLORIDE 0.9 % IV SOLN
200.0000 mg | Freq: Once | INTRAVENOUS | Status: AC
  Administered 2019-01-06: 200 mg via INTRAVENOUS
  Filled 2019-01-06: qty 40

## 2019-01-06 MED ORDER — ALBUTEROL SULFATE HFA 108 (90 BASE) MCG/ACT IN AERS
2.0000 | INHALATION_SPRAY | Freq: Three times a day (TID) | RESPIRATORY_TRACT | Status: DC
  Administered 2019-01-06 – 2019-01-13 (×19): 2 via RESPIRATORY_TRACT
  Filled 2019-01-06: qty 6.7

## 2019-01-06 MED ORDER — IPRATROPIUM BROMIDE HFA 17 MCG/ACT IN AERS
2.0000 | INHALATION_SPRAY | Freq: Three times a day (TID) | RESPIRATORY_TRACT | Status: DC
  Administered 2019-01-06 – 2019-01-13 (×18): 2 via RESPIRATORY_TRACT
  Filled 2019-01-06: qty 12.9

## 2019-01-06 MED ORDER — SODIUM CHLORIDE 0.9 % IV SOLN
100.0000 mg | Freq: Every day | INTRAVENOUS | Status: AC
  Administered 2019-01-07 – 2019-01-10 (×4): 100 mg via INTRAVENOUS
  Filled 2019-01-06 (×5): qty 20

## 2019-01-06 MED ORDER — POTASSIUM CHLORIDE 20 MEQ/15ML (10%) PO SOLN: 40.0000 meq | Freq: Every day | ORAL | Status: DC

## 2019-01-06 MED ORDER — DEXAMETHASONE SODIUM PHOSPHATE 10 MG/ML IJ SOLN
6.0000 mg | INTRAMUSCULAR | Status: DC
  Administered 2019-01-06 – 2019-01-11 (×6): 6 mg via INTRAVENOUS
  Filled 2019-01-06 (×6): qty 1

## 2019-01-06 NOTE — NC FL2 (Signed)
[place] LEVEL OF CARE SCREENING TOOL     IDENTIFICATION  Patient Name: Barbara Gill Birthdate: 24-May-1955 Sex: female Admission Date (Current Location): 01/01/2019  [facility] and [place] Number:  Herbalist and Address:  [facility] [place], [place] [address] [place], [place] [address]      Provider Number: [id number]  Attending Physician Name and Address:  Kayleen Memos, DO  Relative Name and Phone Number:  [name], spouse, [phone]    Current Level of Care: Hospital Recommended Level of Care: [place] Prior Approval Number:    Date Approved/Denied:   PASRR Number: [id number]  Discharge Plan: SNF    Current Diagnoses: Patient Active Problem List   Diagnosis Date Noted  . COVID-19 virus infection 01/01/2019  . Acute encephalopathy 01/01/2019  . Hypotension 01/01/2019  . Hip tenderness, left 01/01/2019  . Sinus bradycardia 01/01/2019  . Diabetes ([place]) 11/08/2015  . Alzheimer's disease ([place]) 11/08/2015  . OSA (obstructive sleep apnea) 11/08/2015  . Nicotine dependence 11/08/2015  . Hyperlipemia 11/08/2015  . ADD (attention deficit disorder) 11/08/2015  . Generalized anxiety disorder 11/08/2015  . Depression 11/08/2015    Orientation RESPIRATION BLADDER Height & Weight     (Disoriented x4)  Normal Incontinent, External catheter Weight: 179 lb 0.2 oz (81.2 kg) Height:  5\' 3"  (160 cm)  BEHAVIORAL SYMPTOMS/MOOD NEUROLOGICAL BOWEL NUTRITION STATUS      Incontinent Diet(Please see DC Summary)  AMBULATORY STATUS COMMUNICATION OF NEEDS Skin   Extensive Assist Verbally Normal                       Personal Care Assistance Level of Assistance  Bathing, Feeding, Dressing Bathing Assistance: Maximum assistance Feeding assistance: Maximum assistance Dressing Assistance: Maximum assistance     Functional Limitations Info  Sight, Hearing, Speech Sight Info: Adequate Hearing Info: Adequate Speech  Info: Adequate    SPECIAL CARE FACTORS FREQUENCY  PT (By licensed PT), OT (By licensed OT)     PT Frequency: 5x OT Frequency: 5x            Contractures      Additional Factors Info  Code Status, Allergies, Isolation Precautions Code Status Info: Full Allergies Info: Seasonal Ic (Cholestatin), Poison Ivy Extract (Poison Ivy Extract)     Isolation Precautions Info: COVID positive     Current Medications (01/06/2019):  This is the current hospital active medication list Current Facility-Administered Medications  Medication Dose Route Frequency Provider Last Rate Last Admin  . acetaminophen (TYLENOL) tablet 650 mg  650 mg Oral Q6H PRN Shela Leff, MD   650 mg at 01/04/19 2244   Or  . acetaminophen (TYLENOL) suppository 650 mg  650 mg Rectal Q6H PRN Shela Leff, MD   650 mg at 01/03/19 2030  . albuterol (VENTOLIN HFA) 108 (90 Base) MCG/ACT inhaler 2 puff  2 puff Inhalation Q8H Hall, Carole N, DO      . chlorhexidine (PERIDEX) 0.12 % solution 15 mL  15 mL Mouth Rinse BID Irene Pap N, DO   15 mL at 01/06/19 0921  . dexamethasone (DECADRON) injection 6 mg  6 mg Intravenous Q24H Hall, Carole N, DO      . enoxaparin (LOVENOX) injection 40 mg  40 mg Subcutaneous Daily Shela Leff, MD   40 mg at 01/06/19 0921  . ipratropium (ATROVENT HFA) inhaler 2 puff  2 puff Inhalation Q8H Hall, Carole N, DO      . MEDLINE mouth  rinse  15 mL Mouth Rinse q12n4p Irene Pap N, DO   15 mL at 01/05/19 1537  . potassium chloride 20 MEQ/15ML (10%) solution 40 mEq  40 mEq Oral Once Kayleen Memos, DO      . remdesivir 200 mg in sodium chloride 0.9% 250 mL IVPB  200 mg Intravenous Once Wendee Beavers, RPH       Followed by  . [START ON 01/07/2019] remdesivir 100 mg in sodium chloride 0.9 % 100 mL IVPB  100 mg Intravenous Daily Wendee Beavers, [facility]         Discharge Medications: Please see discharge summary for a list of discharge medications.  Relevant Imaging Results:  Relevant  Lab Results:   Additional Information SSN: [id number]  [place], [place]

## 2019-01-06 NOTE — TOC Progression Note (Signed)
Transition of Care [facility]) - Progression Note    Patient Details  Name: Barbara Gill MRN: [id number] Date of Birth: May 26, 1955  Transition of Care [facility]) CM/SW [place], LCSW Phone Number: 01/06/2019, 12:02 PM  Clinical Narrative:    9am-CSW spoke with Helene Kelp at [facility] to confirm they can accept patient when stable. Helene Kelp reported yes and requested DC summary when available.   12pm-CSW received call from Helene Kelp stating they now feel patient would do better in rehab before returning to ALF. CSW sent referral to COVID positive accepting SNFs for review. Insurance approval will have to be obtained. Per MD, not medically stable yet.    Expected Discharge Plan: Assisted Living Barriers to Discharge: Continued Medical Work up  Expected Discharge Plan and Services Expected Discharge Plan: Assisted Living In-house Referral: Clinical Social Work Discharge Planning Services: NA   Living arrangements for the past 2 months: [place]                 DME Arranged: N/A                     Social Determinants of Health (SDOH) Interventions    Readmission Risk Interventions No flowsheet data found.

## 2019-01-06 NOTE — Progress Notes (Signed)
PROGRESS NOTE  Barbara Gill [id number] DOB: [birth year] DOA: 01/01/2019 PCP: Marton Redwood, MD  HPI/Recap of past 44 hours: 63 year old female with past medical history significant for advanced Alzheimer's dementia, OSA, primary progressive aphasia, chronic depression, who presented from SNF due to behavioral change.  Patient babbles at baseline and was babbling less.  On presentation to the ED COVID-19 positive on 01/01/2019 with no hypoxia or infiltrates on x-ray therefore not treated for COVID-19.  Also had left hip pain, x-ray and MRI ruled out a fracture.   CT head and MRI brain unremarkable for any acute intracranial findings.   01/06/19: Seen and examined.  Babbles non meaningful words.  She is hypoxic with O2 saturation 87 to 88% on room air at rest, improved on 2L [place].   Assessment/Plan: Principal Problem:   Acute encephalopathy Active Problems:   COVID-19 virus infection   Hypotension   Hip tenderness, left   Sinus bradycardia   Acute metabolic encephalopathy likely in the setting of COVID-19 viral infection CT head admission unremarkable for any acute intracranial findings MRI brain unremarkable for any acute intracranial findings TSH, B12 normal Ammonia level within normal limits UA not indicative of infection urine Culture 20,000 colonies of E. Coli.  Received 1 dose of fosfomycin.  Acute COVID-19 viral infection now with viral PNA Positive Covid-19 01/01/19 Independently reviewed chest x-ray done on admission which shows no lobular infiltrates Now hypoxic with O2 saturation 87 to 88% on room air. Chest x-ray with patchy infiltrates left greater than right. Worsening inflammatory markers Start Remdesivir day#1/5 and IV Decadron x 10 days Start Xopenex 2 puffs 3 times daily and ipratropium 2 puffs 3 times daily Continue oral supplement vitamin C, D3 and zinc. Continue to monitor fever curve Blood cultures negative to date.  Acute hypoxic respiratory  failure O2 sats dropping gradually Now 87-88% RA Management per above  Hypokalemia Potassium 3.4 Repleted  Presumed uncomplicated UTI Urine culture 20,000 E. coli colonies with intermediate ampicillin Source of confusion? AMS? Received 1 dose of fosfomycin on 01/03/2019.  Left hip pain, ruled out fracture  X-ray findings indeterminate for a nondisplaced subcapital fracture of the left femoral neck.  Left hip MRI no evidence of fracture   Worsening leukopenia/thrombocytopenia likely secondary to COVID-19 viral infection Continue to monitor  Resolved Sinus bradycardia: Unclear etiology HR now 60-80's TSH normal  Mild elevated CPK CPK is trending down  Alzheimer's dementia Continue home medications  Chronic depression/anxiety Continue home meds  Ambulatory dysfunction Continue fall Precautions OT recommended SNF     DVT prophylaxis: Lovenox subcu daily Code Status: Full code Family Communication:  None at bedside.   Disposition Plan:  Possible DC back to SNF when bed is available   Objective: Vitals:   01/05/19 2133 01/06/19 0001 01/06/19 0508 01/06/19 0926  BP: 106/82 122/70  107/66  Pulse: 83 74  78  Resp: (!) 23 20  (!) 21  Temp: 98.9 F (37.2 C) 98.7 F (37.1 C) 99.2 F (37.3 C)   TempSrc: Oral Oral Axillary   SpO2: 92% 90%  94%  Weight:      Height:        Intake/Output Summary (Last 24 hours) at 01/06/2019 1109 Last data filed at 01/05/2019 1526 Gross per 24 hour  Intake 120 ml  Output 400 ml  Net -280 ml   Filed Weights   01/02/19 2209 01/05/19 0424  Weight: 81.1 kg 81.2 kg    Exam:  . General: 63 y.o. year-old female well-developed  well-nourished in no acute distress.   . Cardiovascular: Regular rate and rhythm no rubs gallops.   Marland Kitchen Respiratory: Clear to auscultation no wheezes or rales. Abdomen: Soft nontender nondistended normal bowel sounds.  Musculoskeletal: No lower extremity edema.   Psychiatry: Mood is appropriate for  condition and setting.  Data Reviewed: CBC: Recent Labs  Lab 01/01/19 1723 01/02/19 0225 01/03/19 0550 01/04/19 0635 01/05/19 0500 01/06/19 0500  WBC 3.6* 3.7* 3.6* 3.2* 3.2* 3.8*  NEUTROABS 2.4  --  1.7 1.8 1.5* 2.2  HGB 15.2* 13.3 13.2 13.3 13.6 13.9  HCT 47.8* 41.9 40.1 39.6 40.9 41.2  MCV 92.5 92.7 90.5 88.4 88.0 87.3  PLT 163 147* 122* 111* 127* [id number]*   Basic Metabolic Panel: Recent Labs  Lab 01/01/19 1723 01/01/19 1759 01/03/19 0550 01/04/19 0635 01/05/19 0500 01/06/19 0500  NA 142 142 142 137 139 140  K 4.0 4.0 3.6 3.6 3.5 3.4*  CL 105 106 105 101 103 102  CO2 25  --  25 24 27 27   GLUCOSE 91 89 65* 89 97 91  BUN 17 19 9 10 10 9   CREATININE 0.98 0.90 0.69 0.69 0.95 0.70  CALCIUM 8.8*  --  8.3* 8.2* 8.3* 8.5*   GFR: Estimated Creatinine Clearance: 74.5 mL/min (by C-G formula based on SCr of 0.7 mg/dL). Liver Function Tests: Recent Labs  Lab 01/01/19 1723 01/03/19 0550 01/04/19 0635 01/05/19 0500 01/06/19 0500  AST 29 27 40 54* 52*  ALT 33 28 33 49* 64*  ALKPHOS 44 40 40 38 41  BILITOT 0.6 0.9 0.7 0.6 0.9  PROT 7.2 5.7* 5.8* 5.7* 6.0*  ALBUMIN 3.8 3.0* 3.1* 2.9* 3.1*   Recent Labs  Lab 01/01/19 1723  LIPASE 25   Recent Labs  Lab 01/02/19 0221  AMMONIA 16   Coagulation Profile: No results for input(s): INR, PROTIME in the last 168 hours. Cardiac Enzymes: Recent Labs  Lab 01/01/19 1723 01/02/19 0225 01/03/19 0550  CKTOTAL 561* 653* 411*   BNP (last 3 results) No results for input(s): PROBNP in the last 8760 hours. HbA1C: No results for input(s): HGBA1C in the last 72 hours. CBG: Recent Labs  Lab 01/01/19 1715  GLUCAP 77   Lipid Profile: No results for input(s): CHOL, HDL, LDLCALC, TRIG, CHOLHDL, LDLDIRECT in the last 72 hours. Thyroid Function Tests: No results for input(s): TSH, T4TOTAL, FREET4, T3FREE, THYROIDAB in the last 72 hours. Anemia Panel: Recent Labs    01/05/19 0500 01/06/19 0500  FERRITIN 438* 496*   Urine  analysis:    Component Value Date/Time   COLORURINE YELLOW 01/01/2019 1730   APPEARANCEUR HAZY (A) 01/01/2019 1730   LABSPEC 1.029 01/01/2019 1730   PHURINE 5.0 01/01/2019 1730   GLUCOSEU NEGATIVE 01/01/2019 1730   HGBUR NEGATIVE 01/01/2019 1730   BILIRUBINUR NEGATIVE 01/01/2019 1730   KETONESUR 20 (A) 01/01/2019 1730   PROTEINUR 30 (A) 01/01/2019 1730   NITRITE NEGATIVE 01/01/2019 1730   LEUKOCYTESUR NEGATIVE 01/01/2019 1730   Sepsis Labs: @LABRCNTIP (procalcitonin:4,lacticidven:4)  ) Recent Results (from the past 240 hour(s))  Urine culture     Status: Abnormal   Collection Time: 01/01/19  5:23 PM   Specimen: Urine, Random  Result Value Ref Range Status   Specimen Description URINE, RANDOM  Final   Special Requests   Final    NONE Performed at [facility], [place] [address] [place], [place] [address]    Culture 20,000 COLONIES/mL ESCHERICHIA COLI (A)  Final   Report Status 01/03/2019 FINAL  Final   Organism ID, Bacteria ESCHERICHIA COLI (A)  Final      Susceptibility   Escherichia coli - MIC*    AMPICILLIN 16 INTERMEDIATE Intermediate     CEFAZOLIN <=4 SENSITIVE Sensitive     CEFTRIAXONE <=1 SENSITIVE Sensitive     CIPROFLOXACIN <=0.25 SENSITIVE Sensitive     GENTAMICIN <=1 SENSITIVE Sensitive     IMIPENEM <=0.25 SENSITIVE Sensitive     NITROFURANTOIN <=16 SENSITIVE Sensitive     TRIMETH/SULFA <=20 SENSITIVE Sensitive     AMPICILLIN/SULBACTAM 4 SENSITIVE Sensitive     PIP/TAZO <=4 SENSITIVE Sensitive     Extended ESBL NEGATIVE Sensitive     * 20,000 COLONIES/mL ESCHERICHIA COLI  MRSA PCR Screening     Status: None   Collection Time: 01/02/19 10:27 PM   Specimen: Nasal Mucosa; Nasopharyngeal  Result Value Ref Range Status   MRSA by PCR NEGATIVE NEGATIVE Final    Comment:        The GeneXpert MRSA Assay (FDA approved for NASAL specimens only), is one component of a comprehensive MRSA colonization surveillance program. It is not intended to diagnose  MRSA infection nor to guide or monitor treatment for MRSA infections. Performed at [facility], [place] [address] [place], [place] [address]   Culture, blood (routine x 2)     Status: None (Preliminary result)   Collection Time: 01/03/19  9:10 PM   Specimen: BLOOD  Result Value Ref Range Status   Specimen Description BLOOD RIGHT HAND  Final   Special Requests   Final    BOTTLES DRAWN AEROBIC AND ANAEROBIC Blood Culture adequate volume   Culture   Final    NO GROWTH 1 DAY Performed at [facility], [place] [address] [place], [place] [address]    Report Status PENDING  Incomplete  Culture, blood (routine x 2)     Status: None (Preliminary result)   Collection Time: 01/03/19  9:18 PM   Specimen: BLOOD  Result Value Ref Range Status   Specimen Description BLOOD RIGHT HAND  Final   Special Requests   Final    BOTTLES DRAWN AEROBIC AND ANAEROBIC Blood Culture results may not be optimal due to an inadequate volume of blood received in culture bottles   Culture   Final    NO GROWTH 1 DAY Performed at [facility], [place] [address], [place], [place] [address]    Report Status PENDING  Incomplete      Studies: DG CHEST PORT 1 VIEW  Result Date: 01/06/2019 CLINICAL DATA:  Hypoxia.  Altered mental status. EXAM: PORTABLE CHEST 1 VIEW COMPARISON:  January 03, 2019 FINDINGS: Developing opacity in the left lung, diffusely. The opacity is more focal in the region of the lingula. The cardiomediastinal silhouette is stable. Mild opacity seen in the right base. No other acute abnormalities. IMPRESSION: Developing opacities bilaterally, diffusely on the left and more focal in the right base. The findings may represent developing multifocal pneumonia in the appropriate clinical setting. Recommend clinical correlation. Electronically Signed   By: Dorise Bullion III M.D   On: 01/06/2019 09:16    Scheduled Meds: . albuterol  2 puff Inhalation Q8H  . chlorhexidine  15 mL Mouth Rinse BID   . dexamethasone (DECADRON) injection  6 mg Intravenous Q24H  . enoxaparin (LOVENOX) injection  40 mg Subcutaneous Daily  . ipratropium  2 puff Inhalation Q8H  . mouth rinse  15 mL Mouth Rinse q12n4p    Continuous Infusions:  LOS: 4 days     Kayleen Memos, MD [facility] Pager [phone]  If 7PM-7AM, please contact night-coverage [email or web address] Password [email or web address] 01/06/2019, 11:09 AM

## 2019-01-07 LAB — COMPREHENSIVE METABOLIC PANEL
ALT: 62 U/L — ABNORMAL HIGH (ref 0–44)
AST: 43 U/L — ABNORMAL HIGH (ref 15–41)
Albumin: 3 g/dL — ABNORMAL LOW (ref 3.5–5.0)
Alkaline Phosphatase: 42 U/L (ref 38–126)
Anion gap: 11 (ref 5–15)
BUN: 13 mg/dL (ref 8–23)
CO2: 23 mmol/L (ref 22–32)
Calcium: 8.6 mg/dL — ABNORMAL LOW (ref 8.9–10.3)
Chloride: 107 mmol/L (ref 98–111)
Creatinine, Ser: 0.57 mg/dL (ref 0.44–1.00)
GFR calc Af Amer: >60 mL/min (ref >60)
GFR calc non Af Amer: >60 mL/min (ref >60)
Glucose, Bld: 96 mg/dL (ref 70–99)
Potassium: 3.8 mmol/L (ref 3.5–5.1)
Sodium: 141 mmol/L (ref 135–145)
Total Bilirubin: 0.7 mg/dL (ref 0.3–1.2)
Total Protein: 6 g/dL — ABNORMAL LOW (ref 6.5–8.1)

## 2019-01-07 LAB — CBC WITH DIFFERENTIAL/PLATELET
Abs Immature Granulocytes: 0.01 10*3/uL (ref 0.00–0.07)
Basophils Absolute: 0 10*3/uL (ref 0.0–0.1)
Basophils Relative: 0 %
Eosinophils Absolute: 0 10*3/uL (ref 0.0–0.5)
Eosinophils Relative: 0 %
HCT: 39.5 % (ref 36.0–46.0)
Hemoglobin: 12.8 g/dL (ref 12.0–15.0)
Immature Granulocytes: 0 %
Lymphocytes Relative: 32 %
Lymphs Abs: 0.8 10*3/uL (ref 0.7–4.0)
MCH: 28.8 pg (ref 26.0–34.0)
MCHC: 32.4 g/dL (ref 30.0–36.0)
MCV: 89 fL (ref 80.0–100.0)
Monocytes Absolute: 0.3 10*3/uL (ref 0.1–1.0)
Monocytes Relative: 11 %
Neutro Abs: 1.4 10*3/uL — ABNORMAL LOW (ref 1.7–7.7)
Neutrophils Relative %: 57 %
Platelets: 165 10*3/uL (ref 150–400)
RBC: 4.44 MIL/uL (ref 3.87–5.11)
RDW: 12.4 % (ref 11.5–15.5)
WBC: 2.5 10*3/uL — ABNORMAL LOW (ref 4.0–10.5)
nRBC: 0 % (ref 0.0–0.2)

## 2019-01-07 LAB — FERRITIN: Ferritin: 552 ng/mL — ABNORMAL HIGH (ref 11–307)

## 2019-01-07 LAB — LACTATE DEHYDROGENASE: LDH: 255 U/L — ABNORMAL HIGH (ref 98–192)

## 2019-01-07 LAB — D-DIMER, QUANTITATIVE: D-Dimer, Quant: 0.59 ug{FEU}/mL — ABNORMAL HIGH (ref 0.00–0.50)

## 2019-01-07 LAB — C-REACTIVE PROTEIN: CRP: 5.5 mg/dL — ABNORMAL HIGH

## 2019-01-07 MED ORDER — MEMANTINE HCL ER 28 MG PO CP24
28.0000 mg | ORAL_CAPSULE | Freq: Every day | ORAL | Status: DC
  Administered 2019-01-07 – 2019-01-13 (×7): 28 mg via ORAL
  Filled 2019-01-07 (×7): qty 1

## 2019-01-07 MED ORDER — LORATADINE 10 MG PO TABS
10.0000 mg | ORAL_TABLET | Freq: Every day | ORAL | Status: DC
  Administered 2019-01-07 – 2019-01-13 (×7): 10 mg via ORAL
  Filled 2019-01-07 (×7): qty 1

## 2019-01-07 MED ORDER — DONEPEZIL HCL 10 MG PO TABS
10.0000 mg | ORAL_TABLET | Freq: Every day | ORAL | Status: DC
  Administered 2019-01-07 – 2019-01-10 (×4): 10 mg via ORAL
  Filled 2019-01-07 (×4): qty 1

## 2019-01-07 MED ORDER — ESCITALOPRAM OXALATE 10 MG PO TABS
10.0000 mg | ORAL_TABLET | Freq: Every day | ORAL | Status: DC
  Administered 2019-01-07 – 2019-01-13 (×7): 10 mg via ORAL
  Filled 2019-01-07 (×7): qty 1

## 2019-01-07 MED ORDER — SACCHAROMYCES BOULARDII 250 MG PO CAPS
250.0000 mg | ORAL_CAPSULE | Freq: Every day | ORAL | Status: DC
  Administered 2019-01-07 – 2019-01-13 (×7): 250 mg via ORAL
  Filled 2019-01-07 (×8): qty 1

## 2019-01-07 MED ORDER — DIVALPROEX SODIUM ER 250 MG PO TB24
250.0000 mg | ORAL_TABLET | Freq: Two times a day (BID) | ORAL | Status: DC
  Administered 2019-01-07 – 2019-01-10 (×8): 250 mg via ORAL
  Filled 2019-01-07 (×8): qty 1

## 2019-01-07 NOTE — Progress Notes (Signed)
PROGRESS NOTE  Barbara Gill [id number] DOB: 01/02/1956 DOA: 01/01/2019 PCP: Marton Redwood, MD  HPI/Recap of past 11 hours: 63 year old female with past medical history significant for advanced Alzheimer's dementia, OSA, primary progressive aphasia, chronic depression, who presented from SNF due to behavioral change.  Patient babbles at baseline and was babbling less.  On presentation to the ED COVID-19 positive on 01/01/2019 with no hypoxia or infiltrates on x-ray therefore not treated for COVID-19.  Also had left hip pain, x-ray and MRI ruled out a fracture.   CT head and MRI brain unremarkable for any acute intracranial findings.   01/07/19: Seen and examined.  Babbles non meaningful words.  There is no acute distress.  Remdesivir day number 2 out of 5.    Assessment/Plan: Principal Problem:   Acute encephalopathy Active Problems:   COVID-19 virus infection   Hypotension   Hip tenderness, left   Sinus bradycardia   Acute metabolic encephalopathy likely in the setting of COVID-19 viral infection CT head admission unremarkable for any acute intracranial findings MRI brain unremarkable for any acute intracranial findings TSH, B12 normal Ammonia level within normal limits UA not indicative of infection urine Culture 20,000 colonies of E. Coli.  Received 1 dose of fosfomycin.  Acute COVID-19 viral infection now with viral PNA Positive Covid-19 01/01/19 Independently reviewed chest x-ray done on admission which shows no lobular infiltrates Now hypoxic with O2 saturation 87 to 88% on room air.  Hypoxia improved after starting remdesivir and Decadron. Chest x-ray with patchy infiltrates left greater than right from chest x-ray done on 01/06/2019: Personally reviewed, was having worsening inflammatory markers WBC trending down 2.5 CRP trending up 5.5 from 4.4. Ferritin trending 552 Continue to trend markers daily Continue remdesivir day#2/5 and IV Decadron x 10 days Continue  Xopenex 2 puffs 3 times daily and ipratropium 2 puffs 3 times daily Continue oral supplement vitamin C, D3 and zinc. Continue to monitor fever curve Blood cultures negative to date.  Acute leukopenia in the setting of acute COVID-19 viral infection WBC 2.5K Continue to monitor Continue daily CBCs with differentials  Acute hypoxic respiratory failure Not on oxygen supplementation at baseline Hypoxia improved once started on remdesivir and Decadron Maintain O2 saturation greater than 92%. Management as stated above  Resolved post repletion: Hypokalemia Potassium 3.4>> 3.8.  Presumed uncomplicated UTI Urine culture 20,000 E. coli colonies with intermediate ampicillin Source of confusion? AMS? Received 1 dose of fosfomycin on 01/03/2019.  Left hip pain, ruled out fracture  X-ray findings indeterminate for a nondisplaced subcapital fracture of the left femoral neck.  Left hip MRI no evidence of fracture   Worsening leukopenia/thrombocytopenia likely secondary to COVID-19 viral infection Continue to monitor  Intermittent sinus bradycardia: Unclear etiology Intermittently mid to high 50s TSH normal  Mild elevated CPK CPK is trending down  Alzheimer's dementia Continue home medications  Chronic depression/anxiety Continue home meds  Ambulatory dysfunction Continue fall Precautions OT recommended SNF     DVT prophylaxis: Lovenox subcu daily Code Status: Full code Family Communication:  None at bedside.   Disposition Plan:  Plan to discharge to SNF once she has completed 5 days of IV remdesivir.   Objective: Vitals:   01/07/19 0628 01/07/19 0800 01/07/19 1000 01/07/19 1157  BP: 102/73 (!) 117/94    Pulse: (!) 50 (!) 53    Resp: 16 18 15    Temp: 97.9 F (36.6 C)   98 F (36.7 C)  TempSrc: Axillary   Axillary  SpO2: 96% 98%  92%   Weight:      Height:        Intake/Output Summary (Last 24 hours) at 01/07/2019 1312 Last data filed at 01/06/2019 1704 Gross  per 24 hour  Intake 120 ml  Output 400 ml  Net -280 ml   Filed Weights   01/02/19 2209 01/05/19 0424  Weight: 81.1 kg 81.2 kg    Exam:  . General: 63 y.o. year-old female well-developed well-nourished in no acute distress.  Alert and confused in a state of dementia. . Cardiovascular: Regular rate and rhythm no rubs or gallops. Respiratory: Mild rales at bases no wheezing noted.   Abdomen: Soft nontender normal bowel sounds present.   Musculoskeletal: No lower extremity edema bilaterally.  2 out of 4 pulses in all 4 extremities.   Psychiatry: Mood is appropriate for condition and setting.  Data Reviewed: CBC: Recent Labs  Lab 01/03/19 0550 01/04/19 0635 01/05/19 0500 01/06/19 0500 01/07/19 0459  WBC 3.6* 3.2* 3.2* 3.8* 2.5*  NEUTROABS 1.7 1.8 1.5* 2.2 1.4*  HGB 13.2 13.3 13.6 13.9 12.8  HCT 40.1 39.6 40.9 41.2 39.5  MCV 90.5 88.4 88.0 87.3 89.0  PLT 122* 111* 127* 136* [id number]   Basic Metabolic Panel: Recent Labs  Lab 01/03/19 0550 01/04/19 0635 01/05/19 0500 01/06/19 0500 01/06/19 1132 01/07/19 0459  NA 142 137 139 140  --  141  K 3.6 3.6 3.5 3.4*  --  3.8  CL 105 101 103 102  --  107  CO2 25 24 27 27   --  23  GLUCOSE 65* 89 97 91  --  96  BUN 9 10 10 9   --  13  CREATININE 0.69 0.69 0.95 0.70  --  0.57  CALCIUM 8.3* 8.2* 8.3* 8.5*  --  8.6*  MG  --   --   --   --  1.9  --    GFR: Estimated Creatinine Clearance: 74.5 mL/min (by C-G formula based on SCr of 0.57 mg/dL). Liver Function Tests: Recent Labs  Lab 01/03/19 0550 01/04/19 0635 01/05/19 0500 01/06/19 0500 01/07/19 0459  AST 27 40 54* 52* 43*  ALT 28 33 49* 64* 62*  ALKPHOS 40 40 38 41 42  BILITOT 0.9 0.7 0.6 0.9 0.7  PROT 5.7* 5.8* 5.7* 6.0* 6.0*  ALBUMIN 3.0* 3.1* 2.9* 3.1* 3.0*   Recent Labs  Lab 01/01/19 1723  LIPASE 25   Recent Labs  Lab 01/02/19 0221  AMMONIA 16   Coagulation Profile: No results for input(s): INR, PROTIME in the last 168 hours. Cardiac Enzymes: Recent Labs    Lab 01/01/19 1723 01/02/19 0225 01/03/19 0550  CKTOTAL 561* 653* 411*   BNP (last 3 results) No results for input(s): PROBNP in the last 8760 hours. HbA1C: No results for input(s): HGBA1C in the last 72 hours. CBG: Recent Labs  Lab 01/01/19 1715  GLUCAP 77   Lipid Profile: No results for input(s): CHOL, HDL, LDLCALC, TRIG, CHOLHDL, LDLDIRECT in the last 72 hours. Thyroid Function Tests: No results for input(s): TSH, T4TOTAL, FREET4, T3FREE, THYROIDAB in the last 72 hours. Anemia Panel: Recent Labs    01/06/19 0500 01/07/19 0459  FERRITIN 496* 552*   Urine analysis:    Component Value Date/Time   COLORURINE YELLOW 01/01/2019 1730   APPEARANCEUR HAZY (A) 01/01/2019 1730   LABSPEC 1.029 01/01/2019 1730   PHURINE 5.0 01/01/2019 1730   GLUCOSEU NEGATIVE 01/01/2019 1730   HGBUR NEGATIVE 01/01/2019 1730   BILIRUBINUR NEGATIVE 01/01/2019 1730   KETONESUR  20 (A) 01/01/2019 1730   PROTEINUR 30 (A) 01/01/2019 1730   NITRITE NEGATIVE 01/01/2019 1730   LEUKOCYTESUR NEGATIVE 01/01/2019 1730   Sepsis Labs: @LABRCNTIP (procalcitonin:4,lacticidven:4)  ) Recent Results (from the past 240 hour(s))  Urine culture     Status: Abnormal   Collection Time: 01/01/19  5:23 PM   Specimen: Urine, Random  Result Value Ref Range Status   Specimen Description URINE, RANDOM  Final   Special Requests   Final    NONE Performed at [facility], [place] [address], [place], [place] [address]    Culture 20,000 COLONIES/mL ESCHERICHIA COLI (A)  Final   Report Status 01/03/2019 FINAL  Final   Organism ID, Bacteria ESCHERICHIA COLI (A)  Final      Susceptibility   Escherichia coli - MIC*    AMPICILLIN 16 INTERMEDIATE Intermediate     CEFAZOLIN <=4 SENSITIVE Sensitive     CEFTRIAXONE <=1 SENSITIVE Sensitive     CIPROFLOXACIN <=0.25 SENSITIVE Sensitive     GENTAMICIN <=1 SENSITIVE Sensitive     IMIPENEM <=0.25 SENSITIVE Sensitive     NITROFURANTOIN <=16 SENSITIVE Sensitive      TRIMETH/SULFA <=20 SENSITIVE Sensitive     AMPICILLIN/SULBACTAM 4 SENSITIVE Sensitive     PIP/TAZO <=4 SENSITIVE Sensitive     Extended ESBL NEGATIVE Sensitive     * 20,000 COLONIES/mL ESCHERICHIA COLI  MRSA PCR Screening     Status: None   Collection Time: 01/02/19 10:27 PM   Specimen: Nasal Mucosa; Nasopharyngeal  Result Value Ref Range Status   MRSA by PCR NEGATIVE NEGATIVE Final    Comment:        The GeneXpert MRSA Assay (FDA approved for NASAL specimens only), is one component of a comprehensive MRSA colonization surveillance program. It is not intended to diagnose MRSA infection nor to guide or monitor treatment for MRSA infections. Performed at [facility], [place] [address], [place], [place] [address]   Culture, blood (routine x 2)     Status: None (Preliminary result)   Collection Time: 01/03/19  9:10 PM   Specimen: BLOOD  Result Value Ref Range Status   Specimen Description BLOOD RIGHT HAND  Final   Special Requests   Final    BOTTLES DRAWN AEROBIC AND ANAEROBIC Blood Culture adequate volume   Culture   Final    NO GROWTH 3 DAYS Performed at [facility], [place] [address], [place], [place] [address]    Report Status PENDING  Incomplete  Culture, blood (routine x 2)     Status: None (Preliminary result)   Collection Time: 01/03/19  9:18 PM   Specimen: BLOOD  Result Value Ref Range Status   Specimen Description BLOOD RIGHT HAND  Final   Special Requests   Final    BOTTLES DRAWN AEROBIC AND ANAEROBIC Blood Culture results may not be optimal due to an inadequate volume of blood received in culture bottles   Culture   Final    NO GROWTH 3 DAYS Performed at [facility], [place] [address], [place], [place] [address]    Report Status PENDING  Incomplete      Studies: No results found.  Scheduled Meds: . albuterol  2 puff Inhalation Q8H  . chlorhexidine  15 mL Mouth Rinse BID  . dexamethasone (DECADRON) injection  6 mg Intravenous Q24H  .  enoxaparin (LOVENOX) injection  40 mg Subcutaneous Daily  . ipratropium  2 puff Inhalation Q8H  . mouth rinse  15 mL  Mouth Rinse q12n4p    Continuous Infusions: . remdesivir 100 mg in NS 100 mL 100 mg (01/07/19 1023)     LOS: 5 days     Kayleen Memos, MD [facility] Pager [phone]  If 7PM-7AM, please contact night-coverage [email or web address] Password [email or web address] 01/07/2019, 1:12 PM

## 2019-01-07 NOTE — Progress Notes (Signed)
Patient having bradycardia, MEWs red at 0000, yellow att 0150 Dr. Kennon Holter notified, RRT also notified. Patient is symptomatic, no change mental status Monitoring VS q1hr per RRT, MEWS score is currently green will continue to monitor.

## 2019-01-07 NOTE — Significant Event (Addendum)
Rapid Response Event Note  Overview: Called d/t MEWS-red at midnight for BP-70/46, LOC, and RR-26. Pt's MEWS is now green: HR-43/(SB per RN), BP-109/70, RR-14, SpO2-95% on 2L [place]. Per RN, pt neuro status is at baseline. Pt is bradycardic but asymptomatic at this time. Pt was bradycardia on admission as well but it seemed to somewhat resolved until tonight. Recommended q1h VS and alerting MD to bradycardia. Please call RRT if assistance needed.    Barbara Gill

## 2019-01-07 NOTE — Plan of Care (Signed)
  Problem: Health Behavior/Discharge Planning: Goal: Ability to manage health-related needs will improve Outcome: Progressing   Problem: Clinical Measurements: Goal: Ability to maintain clinical measurements within normal limits will improve Outcome: Progressing Goal: Will remain free from infection Outcome: Progressing Goal: Diagnostic test results will improve Outcome: Progressing Goal: Respiratory complications will improve Outcome: Progressing   Problem: Pain Managment: Goal: General experience of comfort will improve Outcome: Progressing   Problem: Safety: Goal: Ability to remain free from injury will improve Outcome: Progressing

## 2019-01-08 LAB — COMPREHENSIVE METABOLIC PANEL
ALT: 59 U/L — ABNORMAL HIGH (ref 0–44)
AST: 38 U/L (ref 15–41)
Albumin: 2.9 g/dL — ABNORMAL LOW (ref 3.5–5.0)
Alkaline Phosphatase: 41 U/L (ref 38–126)
Anion gap: 9 (ref 5–15)
BUN: 17 mg/dL (ref 8–23)
CO2: 27 mmol/L (ref 22–32)
Calcium: 8.7 mg/dL — ABNORMAL LOW (ref 8.9–10.3)
Chloride: 106 mmol/L (ref 98–111)
Creatinine, Ser: 0.62 mg/dL (ref 0.44–1.00)
GFR calc Af Amer: >60 mL/min (ref >60)
GFR calc non Af Amer: >60 mL/min (ref >60)
Glucose, Bld: 99 mg/dL (ref 70–99)
Potassium: 3.7 mmol/L (ref 3.5–5.1)
Sodium: 142 mmol/L (ref 135–145)
Total Bilirubin: 1 mg/dL (ref 0.3–1.2)
Total Protein: 5.8 g/dL — ABNORMAL LOW (ref 6.5–8.1)

## 2019-01-08 LAB — CBC WITH DIFFERENTIAL/PLATELET
Abs Immature Granulocytes: 0.01 10*3/uL (ref 0.00–0.07)
Basophils Absolute: 0 10*3/uL (ref 0.0–0.1)
Basophils Relative: 0 %
Eosinophils Absolute: 0 10*3/uL (ref 0.0–0.5)
Eosinophils Relative: 0 %
HCT: 39.1 % (ref 36.0–46.0)
Hemoglobin: 13.3 g/dL (ref 12.0–15.0)
Immature Granulocytes: 0 %
Lymphocytes Relative: 32 %
Lymphs Abs: 1.4 10*3/uL (ref 0.7–4.0)
MCH: 29.1 pg (ref 26.0–34.0)
MCHC: 34 g/dL (ref 30.0–36.0)
MCV: 85.6 fL (ref 80.0–100.0)
Monocytes Absolute: 0.4 10*3/uL (ref 0.1–1.0)
Monocytes Relative: 9 %
Neutro Abs: 2.6 10*3/uL (ref 1.7–7.7)
Neutrophils Relative %: 59 %
Platelets: 205 10*3/uL (ref 150–400)
RBC: 4.57 MIL/uL (ref 3.87–5.11)
RDW: 12.4 % (ref 11.5–15.5)
WBC: 4.4 10*3/uL (ref 4.0–10.5)
nRBC: 0 % (ref 0.0–0.2)

## 2019-01-08 LAB — LACTATE DEHYDROGENASE: LDH: 219 U/L — ABNORMAL HIGH (ref 98–192)

## 2019-01-08 LAB — D-DIMER, QUANTITATIVE: D-Dimer, Quant: 0.5 ug/mL-FEU (ref 0.00–0.50)

## 2019-01-08 LAB — C-REACTIVE PROTEIN: CRP: 1.1 mg/dL — ABNORMAL HIGH (ref <1.0)

## 2019-01-08 LAB — FERRITIN: Ferritin: 617 ng/mL — ABNORMAL HIGH (ref 11–307)

## 2019-01-08 NOTE — Progress Notes (Signed)
Updated the patient's spouse Mr. [name] via phone. All questions answered to his satisfaction.

## 2019-01-08 NOTE — Progress Notes (Signed)
PROGRESS NOTE  Barbara Gill [id number] DOB: 17-Sep-1955 DOA: 01/01/2019 PCP: Marton Redwood, MD  HPI/Recap of past 17 hours: 63 year old female with past medical history significant for advanced Alzheimer's dementia, OSA, primary progressive aphasia, chronic depression, who presented from SNF due to behavioral change.  Patient babbles at baseline and was babbling less.  On presentation to the ED COVID-19 positive on 01/01/2019 with no hypoxia or infiltrates on x-ray therefore not treated for COVID-19.  Also had left hip pain, x-ray and MRI ruled out a fracture.   CT head and MRI brain unremarkable for any acute intracranial findings.   01/08/19: Seen and examined.  No new complaints.  Babbles nonsensical words.  Day number 3 out of 5 of remdesivir.  O2 saturation improved on remdesivir and Decadron.  Plan is to discharge to SNF.  CSW assisting with placement.   Assessment/Plan: Principal Problem:   Acute encephalopathy Active Problems:   COVID-19 virus infection   Hypotension   Hip tenderness, left   Sinus bradycardia   Resolving acute metabolic encephalopathy likely in the setting of COVID-19 viral infection CT head admission unremarkable for any acute intracranial findings MRI brain unremarkable for any acute intracranial findings TSH, B12 normal Ammonia level within normal limits UA not indicative of infection urine Culture 20,000 colonies of E. Coli.  Received 1 dose of fosfomycin.  Acute COVID-19 viral infection now with viral PNA Positive Covid-19 01/01/19 Independently reviewed chest x-ray done on admission which shows no lobular infiltrates Chest x-ray with patchy infiltrates left greater than right from chest x-ray done on 01/06/2019: Personally reviewed, was having worsening inflammatory markers and started on remdesivir and Decadron O2 saturation improved on remdesivir and Decadron, back to oxygen saturation greater than 92% on room air. Inflammatory markers are  trending down Continue inhalers, vitamin C D3 and zinc  Physical debility/ambulatory dysfunction Completed PT assessment with recommendation for SNF CSW has been consulted for SNF placement. Continue PT OT with assistance and fall precautions  Resolved acute leukopenia in the setting of acute COVID-19 viral infection WBC 2.5K>> 4.4K Continue to monitor Continue daily CBCs with differentials  Resolving Acute hypoxic respiratory failure Not on oxygen supplementation at baseline Hypoxia improved once started on remdesivir and Decadron Maintain O2 saturation greater than 92%. Management as stated above  Resolved post repletion: Hypokalemia Potassium 3.4>> 3.8.  Presumed uncomplicated UTI, treated Urine culture 20,000 E. coli colonies with intermediate ampicillin Source of confusion? AMS? Received 1 dose of fosfomycin on 01/03/2019.  Left hip pain, ruled out fracture  X-ray findings indeterminate for a nondisplaced subcapital fracture of the left femoral neck.  Left hip MRI no evidence of fracture   Resolved leukopenia/thrombocytopenia likely secondary to COVID-19 viral infection Continue to monitor  Intermittent sinus bradycardia: Unclear etiology Intermittently mid to high 50s TSH normal  Mild elevated CPK CPK is trending down  Alzheimer's dementia Continue home medications  Chronic depression/anxiety Continue home meds     DVT prophylaxis: Lovenox subcu daily Code Status: Full code Family Communication:  None at bedside.   Disposition Plan:  Plan to discharge to SNF once she has completed 5 days of IV remdesivir on 01/10/19.   Objective: Vitals:   01/07/19 2302 01/08/19 0423 01/08/19 0550 01/08/19 1258  BP: (!) 112/101 112/83 (!) 120/94 130/87  Pulse:  (!) 48 (!) 51 66  Resp:  15 17   Temp:  97.9 F (36.6 C) 98.3 F (36.8 C) 98.6 F (37 C)  TempSrc:  Axillary Axillary Axillary  SpO2:  94% 95%   Weight:      Height:        Intake/Output Summary  (Last 24 hours) at 01/08/2019 1342 Last data filed at 01/07/2019 1800 Gross per 24 hour  Intake 120 ml  Output 500 ml  Net -380 ml   Filed Weights   01/02/19 2209 01/05/19 0424  Weight: 81.1 kg 81.2 kg    Exam:  . General: 63 y.o. year-old female well-nourished in no acute distress.  Alert and confused in the setting of dementia.   . Cardiovascular: Regular rate and rhythm no rubs or gallops. Respiratory: Clear to auscultation no wheezes or rales.  Poor inspiratory effort.   Abdomen: Soft nontender normal bowel sounds present.   Musculoskeletal: No extremity edema.  Palpable pulses in all 4 extremities. Psychiatry: Mood is appropriate for condition and setting. Data Reviewed: CBC: Recent Labs  Lab 01/04/19 0635 01/05/19 0500 01/06/19 0500 01/07/19 0459 01/08/19 0500  WBC 3.2* 3.2* 3.8* 2.5* 4.4  NEUTROABS 1.8 1.5* 2.2 1.4* 2.6  HGB 13.3 13.6 13.9 12.8 13.3  HCT 39.6 40.9 41.2 39.5 39.1  MCV 88.4 88.0 87.3 89.0 85.6  PLT 111* 127* 136* 165 [id number]   Basic Metabolic Panel: Recent Labs  Lab 01/04/19 0635 01/05/19 0500 01/06/19 0500 01/06/19 1132 01/07/19 0459 01/08/19 0500  NA 137 139 140  --  141 142  K 3.6 3.5 3.4*  --  3.8 3.7  CL 101 103 102  --  107 106  CO2 24 27 27   --  23 27  GLUCOSE 89 97 91  --  96 99  BUN 10 10 9   --  13 17  CREATININE 0.69 0.95 0.70  --  0.57 0.62  CALCIUM 8.2* 8.3* 8.5*  --  8.6* 8.7*  MG  --   --   --  1.9  --   --    GFR: Estimated Creatinine Clearance: 74.5 mL/min (by C-G formula based on SCr of 0.62 mg/dL). Liver Function Tests: Recent Labs  Lab 01/04/19 0635 01/05/19 0500 01/06/19 0500 01/07/19 0459 01/08/19 0500  AST 40 54* 52* 43* 38  ALT 33 49* 64* 62* 59*  ALKPHOS 40 38 41 42 41  BILITOT 0.7 0.6 0.9 0.7 1.0  PROT 5.8* 5.7* 6.0* 6.0* 5.8*  ALBUMIN 3.1* 2.9* 3.1* 3.0* 2.9*   Recent Labs  Lab 01/01/19 1723  LIPASE 25   Recent Labs  Lab 01/02/19 0221  AMMONIA 16   Coagulation Profile: No results for  input(s): INR, PROTIME in the last 168 hours. Cardiac Enzymes: Recent Labs  Lab 01/01/19 1723 01/02/19 0225 01/03/19 0550  CKTOTAL 561* 653* 411*   BNP (last 3 results) No results for input(s): PROBNP in the last 8760 hours. HbA1C: No results for input(s): HGBA1C in the last 72 hours. CBG: Recent Labs  Lab 01/01/19 1715  GLUCAP 77   Lipid Profile: No results for input(s): CHOL, HDL, LDLCALC, TRIG, CHOLHDL, LDLDIRECT in the last 72 hours. Thyroid Function Tests: No results for input(s): TSH, T4TOTAL, FREET4, T3FREE, THYROIDAB in the last 72 hours. Anemia Panel: Recent Labs    01/07/19 0459 01/08/19 0500  FERRITIN 552* 617*   Urine analysis:    Component Value Date/Time   COLORURINE YELLOW 01/01/2019 1730   APPEARANCEUR HAZY (A) 01/01/2019 1730   LABSPEC 1.029 01/01/2019 1730   PHURINE 5.0 01/01/2019 1730   GLUCOSEU NEGATIVE 01/01/2019 1730   HGBUR NEGATIVE 01/01/2019 1730   BILIRUBINUR NEGATIVE 01/01/2019 1730   KETONESUR 20 (A) 01/01/2019  [place] (A) 01/01/2019 1730   NITRITE NEGATIVE 01/01/2019 1730   LEUKOCYTESUR NEGATIVE 01/01/2019 1730   Sepsis Labs: @LABRCNTIP (procalcitonin:4,lacticidven:4)  ) Recent Results (from the past 240 hour(s))  Urine culture     Status: Abnormal   Collection Time: 01/01/19  5:23 PM   Specimen: Urine, Random  Result Value Ref Range Status   Specimen Description URINE, RANDOM  Final   Special Requests   Final    NONE Performed at [facility], [place] [address] [place], [place] [address]    Culture 20,000 COLONIES/mL ESCHERICHIA COLI (A)  Final   Report Status 01/03/2019 FINAL  Final   Organism ID, Bacteria ESCHERICHIA COLI (A)  Final      Susceptibility   Escherichia coli - MIC*    AMPICILLIN 16 INTERMEDIATE Intermediate     CEFAZOLIN <=4 SENSITIVE Sensitive     CEFTRIAXONE <=1 SENSITIVE Sensitive     CIPROFLOXACIN <=0.25 SENSITIVE Sensitive     GENTAMICIN <=1 SENSITIVE Sensitive     IMIPENEM <=0.25  SENSITIVE Sensitive     NITROFURANTOIN <=16 SENSITIVE Sensitive     TRIMETH/SULFA <=20 SENSITIVE Sensitive     AMPICILLIN/SULBACTAM 4 SENSITIVE Sensitive     PIP/TAZO <=4 SENSITIVE Sensitive     Extended ESBL NEGATIVE Sensitive     * 20,000 COLONIES/mL ESCHERICHIA COLI  MRSA PCR Screening     Status: None   Collection Time: 01/02/19 10:27 PM   Specimen: Nasal Mucosa; Nasopharyngeal  Result Value Ref Range Status   MRSA by PCR NEGATIVE NEGATIVE Final    Comment:        The GeneXpert MRSA Assay (FDA approved for NASAL specimens only), is one component of a comprehensive MRSA colonization surveillance program. It is not intended to diagnose MRSA infection nor to guide or monitor treatment for MRSA infections. Performed at [facility], [place] [address] [place], [place] [address]   Culture, blood (routine x 2)     Status: None (Preliminary result)   Collection Time: 01/03/19  9:10 PM   Specimen: BLOOD  Result Value Ref Range Status   Specimen Description BLOOD RIGHT HAND  Final   Special Requests   Final    BOTTLES DRAWN AEROBIC AND ANAEROBIC Blood Culture adequate volume   Culture   Final    NO GROWTH 4 DAYS Performed at [facility], [place] [address], [place], [place] [address]    Report Status PENDING  Incomplete  Culture, blood (routine x 2)     Status: None (Preliminary result)   Collection Time: 01/03/19  9:18 PM   Specimen: BLOOD  Result Value Ref Range Status   Specimen Description BLOOD RIGHT HAND  Final   Special Requests   Final    BOTTLES DRAWN AEROBIC AND ANAEROBIC Blood Culture results may not be optimal due to an inadequate volume of blood received in culture bottles   Culture   Final    NO GROWTH 4 DAYS Performed at [facility], [place] [address], [place], [place] [address]    Report Status PENDING  Incomplete      Studies: No results found.  Scheduled Meds: . albuterol  2 puff Inhalation Q8H  . chlorhexidine  15 mL Mouth Rinse BID  .  dexamethasone (DECADRON) injection  6 mg Intravenous Q24H  . divalproex  250 mg Oral BID  . donepezil  10 mg Oral QHS  . enoxaparin (LOVENOX) injection  40 mg Subcutaneous Daily  .  escitalopram  10 mg Oral Daily  . ipratropium  2 puff Inhalation Q8H  . loratadine  10 mg Oral Daily  . mouth rinse  15 mL Mouth Rinse q12n4p  . memantine  28 mg Oral Daily  . saccharomyces boulardii  250 mg Oral Daily    Continuous Infusions: . remdesivir 100 mg in NS 100 mL 100 mg (01/08/19 1035)     LOS: 6 days     Kayleen Memos, MD [facility] Pager [phone]  If 7PM-7AM, please contact night-coverage [email or web address] Password [email or web address] 01/08/2019, 1:42 PM

## 2019-01-09 LAB — CULTURE, BLOOD (ROUTINE X 2)
Culture: NO GROWTH
Culture: NO GROWTH
Special Requests: ADEQUATE

## 2019-01-09 LAB — COMPREHENSIVE METABOLIC PANEL
ALT: 52 U/L — ABNORMAL HIGH (ref 0–44)
AST: 35 U/L (ref 15–41)
Albumin: 2.9 g/dL — ABNORMAL LOW (ref 3.5–5.0)
Alkaline Phosphatase: 39 U/L (ref 38–126)
Anion gap: 10 (ref 5–15)
BUN: 17 mg/dL (ref 8–23)
CO2: 27 mmol/L (ref 22–32)
Calcium: 8.6 mg/dL — ABNORMAL LOW (ref 8.9–10.3)
Chloride: 105 mmol/L (ref 98–111)
Creatinine, Ser: 0.65 mg/dL (ref 0.44–1.00)
GFR calc Af Amer: >60 mL/min (ref >60)
GFR calc non Af Amer: >60 mL/min (ref >60)
Glucose, Bld: 89 mg/dL (ref 70–99)
Potassium: 3.5 mmol/L (ref 3.5–5.1)
Sodium: 142 mmol/L (ref 135–145)
Total Bilirubin: 0.7 mg/dL (ref 0.3–1.2)
Total Protein: 5.7 g/dL — ABNORMAL LOW (ref 6.5–8.1)

## 2019-01-09 LAB — CBC WITH DIFFERENTIAL/PLATELET
Abs Immature Granulocytes: 0.01 10*3/uL (ref 0.00–0.07)
Basophils Absolute: 0 10*3/uL (ref 0.0–0.1)
Basophils Relative: 0 %
Eosinophils Absolute: 0 10*3/uL (ref 0.0–0.5)
Eosinophils Relative: 0 %
HCT: 40.2 % (ref 36.0–46.0)
Hemoglobin: 13.4 g/dL (ref 12.0–15.0)
Immature Granulocytes: 0 %
Lymphocytes Relative: 39 %
Lymphs Abs: 1.7 10*3/uL (ref 0.7–4.0)
MCH: 28.8 pg (ref 26.0–34.0)
MCHC: 33.3 g/dL (ref 30.0–36.0)
MCV: 86.5 fL (ref 80.0–100.0)
Monocytes Absolute: 0.5 10*3/uL (ref 0.1–1.0)
Monocytes Relative: 12 %
Neutro Abs: 2.1 10*3/uL (ref 1.7–7.7)
Neutrophils Relative %: 49 %
Platelets: 239 10*3/uL (ref 150–400)
RBC: 4.65 MIL/uL (ref 3.87–5.11)
RDW: 12.2 % (ref 11.5–15.5)
WBC: 4.4 10*3/uL (ref 4.0–10.5)
nRBC: 0 % (ref 0.0–0.2)

## 2019-01-09 LAB — D-DIMER, QUANTITATIVE: D-Dimer, Quant: 0.46 ug/mL-FEU (ref 0.00–0.50)

## 2019-01-09 LAB — FERRITIN: Ferritin: 609 ng/mL — ABNORMAL HIGH (ref 11–307)

## 2019-01-09 LAB — LACTATE DEHYDROGENASE: LDH: 243 U/L — ABNORMAL HIGH (ref 98–192)

## 2019-01-09 LAB — C-REACTIVE PROTEIN: CRP: 1 mg/dL — ABNORMAL HIGH (ref <1.0)

## 2019-01-09 NOTE — TOC Progression Note (Signed)
Transition of Care [facility]) - Progression Note    Patient Details  Name: Barbara Gill MRN: [id number] Date of Birth: 1955-09-30  Transition of Care [facility]) CM/SW [place], LCSW Phone Number: 01/09/2019, 5:15 PM  Clinical Narrative:    CSW presented bed offers to patient's spouse. He prefers [place]. Miquel Dunn is checking bed availability for when patient is medically stable.    Expected Discharge Plan: Assisted Living Barriers to Discharge: Continued Medical Work up  Expected Discharge Plan and Services Expected Discharge Plan: Assisted Living In-house Referral: Clinical Social Work Discharge Planning Services: NA   Living arrangements for the past 2 months: [place]                 DME Arranged: N/A                     Social Determinants of Health (SDOH) Interventions    Readmission Risk Interventions No flowsheet data found.

## 2019-01-09 NOTE — Progress Notes (Signed)
Occupational Therapy Treatment Patient Details Name: Barbara Gill MRN: [id number] DOB: 10-23-1955 Today's Date: 01/09/2019    History of present illness 63 year old female with past medical history significant for advanced Alzheimer's dementia, OSA, primary progressive aphasia, chronic depression, who presented from SNF due to behavioral change.  Normally ambulatory with 1 person assist or walker, and pt would not ambulate.  Noted question hip fx  on x-ray, but MRI negative.  Covid +.   OT comments  Upon OT arrival, pt found to to be soiled. Assisted pt with anterior/ posterior pericare via rolling. Pt required MAX- total A +2 for all aspects of bed mobility. Pt making unintelligible speech throughout session, not responding to commands. DC plan currently remains appropriate, will continue to follow acutely per POC.    Follow Up Recommendations  SNF;Supervision/Assistance - 24 hour;Other (comment)(vs back to [facility], pending available assist)    Equipment Recommendations  Other (comment)(defer to next venue of care)    Recommendations for Other Services      Precautions / Restrictions Precautions Precautions: Fall Precaution Comments: dementia Restrictions Weight Bearing Restrictions: No       Mobility Bed Mobility Overal bed mobility: Needs Assistance Bed Mobility: Rolling Rolling: Max assist;Total assist;+2 for physical assistance         General bed mobility comments: total A- MAX A for all aspects of bed mobility, pt initially attempting to hold on to rail to sustain sidelying but needing MAX A to maintain position  Transfers                 General transfer comment: unable to attempt    Balance                                           ADL either performed or assessed with clinical judgement   ADL Overall ADL's : Needs assistance/impaired                 Upper Body Dressing : Maximal assistance;Bed level            Toileting- Clothing Manipulation and Hygiene: Total assistance;Bed level Toileting - Clothing Manipulation Details (indicate cue type and reason): total A for all apects of hygiene to assist with pericare after BM     Functional mobility during ADLs: Total assistance;Maximal assistance;+2 for physical assistance(bed mobility) General ADL Comments: session focus on bed mobility to assist with cleanup after incontinent BM. Pt required total A - MAX A to roll     Vision Baseline Vision/History: No visual deficits     Perception     Praxis      Cognition Arousal/Alertness: Awake/alert Behavior During Therapy: WFL for tasks assessed/performed Overall Cognitive Status: History of cognitive impairments - at baseline                                 General Comments: pt making unintelligible speech throughout session        Exercises     Shoulder Instructions       General Comments brady throughout session with HR mid to high 50 bpm    Pertinent Vitals/ Pain       Pain Assessment: Faces Faces Pain Scale: Hurts little more Pain Location: pt crying out when wiping pt after BM Pain Descriptors / Indicators: Grimacing;Crying;Moaning Pain Intervention(s): Limited  activity within patient's tolerance;Monitored during session;Repositioned  Home Living                                          Prior Functioning/Environment              Frequency  Min 2X/week        Progress Toward Goals  OT Goals(current goals can now be found in the care plan section)  Progress towards OT goals: Progressing toward goals  Acute Rehab OT Goals Patient Stated Goal: unable to state OT Goal Formulation: Patient unable to participate in goal setting Time For Goal Achievement: 01/17/19 Potential to Achieve Goals: Good  Plan Discharge plan remains appropriate    Co-evaluation                 AM-PAC OT "6 Clicks" Daily Activity     Outcome  Measure   Help from another person eating meals?: Total Help from another person taking care of personal grooming?: Total Help from another person toileting, which includes using toliet, bedpan, or urinal?: Total Help from another person bathing (including washing, rinsing, drying)?: Total Help from another person to put on and taking off regular upper body clothing?: Total Help from another person to put on and taking off regular lower body clothing?: Total 6 Click Score: 6    End of Session    OT Visit Diagnosis: Muscle weakness (generalized) (M62.81);Other abnormalities of gait and mobility (R26.89);Other symptoms and signs involving cognitive function   Activity Tolerance Patient tolerated treatment well   Patient Left in bed;with bed alarm set;with call bell/phone within reach   Nurse Communication Mobility status;Other (comment)(needs new purewick, urine with ammonia scent)        Time: [id number] OT Time Calculation (min): 25 min  Charges: OT General Charges $OT Visit: 1 Visit OT Treatments $Self Care/Home Management : 23-37 mins  Barbara Clam., COTA/L Acute Rehabilitation Services [id number]    Barbara Gill 01/09/2019, 1:14 PM

## 2019-01-09 NOTE — Progress Notes (Signed)
PROGRESS NOTE  Barbara Gill [id number] DOB: March 07, 1955 DOA: 01/01/2019 PCP: Marton Redwood, MD  HPI/Recap of past 20 hours: 63 year old female with past medical history significant for advanced Alzheimer's dementia, OSA, primary progressive aphasia, chronic depression, who presented from ALF due to sudden behavioral change.  Patient babbles at baseline and was babbling significantly less.  On presentation to the ED, CT head and MRI brain unremarkable for any acute intracranial findings.  Positive COVID-19 test on 01/01/2019.  Admission x-ray showed no lobular infiltrates, not hypoxic.  Not started on COVID-19 directed therapy remdesivir or Decadron.   Had left hip pain, x-ray and MRI ruled out a fracture.  On 01/06/19 developed hypoxia.  CXR showed markings suggestive of viral PNA.  Started on COVID-19 directed therapies.  01/09/19: Seen and examined.  Day number 4 out of 5 of remdesivir.  She is alert and babbles.  Per her husband "when she babbles it is a good thing."  Does not appear in distress.  O2 saturation 96% on room air.  Plan is to DC to SNF when bed is available possibly tomorrow 01/10/19.   Assessment/Plan: Principal Problem:   Acute encephalopathy Active Problems:   COVID-19 virus infection   Hypotension   Hip tenderness, left   Sinus bradycardia   Resolving acute metabolic encephalopathy likely in the setting of COVID-19 viral infection CT head admission unremarkable for any acute intracranial findings MRI brain unremarkable for any acute intracranial findings TSH, B12 normal Ammonia level within normal limits urine Culture 20,000 colonies of E. Coli.  Received 1 dose of fosfomycin.  Acute COVID-19 viral infection now with viral PNA Positive Covid-19 01/01/19 Independently reviewed chest x-ray done on admission which shows no lobular infiltrates Chest x-ray with patchy infiltrates left greater than right from chest x-ray done on 01/06/2019: Personally reviewed, was  having worsening inflammatory markers and started on remdesivir and Decadron O2 saturation improved on remdesivir and Decadron, back to oxygen saturation greater than 92% on room air. Inflammatory markers are trending down. Continue inhalers, vitamin C D3 and zinc  Physical debility/ambulatory dysfunction Completed PT assessment with recommendation for SNF CSW has been consulted for SNF placement. Continue PT OT with assistance and fall precautions  Resolved acute leukopenia in the setting of acute COVID-19 viral infection WBC 2.5K>> 4.4K Continue to monitor Continue daily CBCs with differentials  Resolving Acute hypoxic respiratory failure Not on oxygen supplementation at baseline Hypoxia improved once started on remdesivir and Decadron Maintain O2 saturation greater than 92%. Management as stated above  Resolved post repletion: Hypokalemia Potassium 3.4>> 3.8.  Presumed uncomplicated UTI, treated Urine culture 20,000 E. coli colonies with intermediate ampicillin Source of confusion? AMS? Received 1 dose of fosfomycin on 01/03/2019.  Left hip pain, ruled out fracture  X-ray findings indeterminate for a nondisplaced subcapital fracture of the left femoral neck.  Left hip MRI no evidence of fracture   Resolved leukopenia/thrombocytopenia likely secondary to COVID-19 viral infection Continue to monitor  Intermittent sinus bradycardia: Unclear etiology Intermittently mid to high 50s TSH normal  Mild elevated CPK CPK is trending down  Alzheimer's dementia Continue home medications  Chronic depression/anxiety Continue home meds     DVT prophylaxis: Lovenox subcu daily Code Status: Full code Family Communication:  None at bedside.   Disposition Plan:  Plan to discharge to SNF once she has completed 5 days of IV remdesivir on 01/10/19.   Objective: Vitals:   01/08/19 2229 01/09/19 0000 01/09/19 0612 01/09/19 1000  BP: 132/84 130/74 126/77 136/78  Pulse:  (!) 58   (!) 53  Resp:  17  15  Temp:  98.7 F (37.1 C) 98.9 F (37.2 C) 98.7 F (37.1 C)  TempSrc:  Axillary Axillary Axillary  SpO2:  96%  95%  Weight:      Height:       No intake or output data in the 24 hours ending 01/09/19 1217 Filed Weights   01/02/19 2209 01/05/19 0424  Weight: 81.1 kg 81.2 kg    Exam:  . General: 63 y.o. year-old female well-nourished radicular no acute distress.  Alert and babbles. Cardiovascular: Regular rate and rhythm no rubs or gallops.  No JVD or thyromegaly noted. Respiratory: Clear to auscultation no wheezes or rales. Poor inspiratory effort.   Abdomen: Soft nontender normal bowel sounds present.   Musculoskeletal: No lower extremity edema.  2 out of 4 pulses in all 4 extremities.   Psychiatry: Mood is appropriate for condition and setting.   Data Reviewed: CBC: Recent Labs  Lab 01/05/19 0500 01/06/19 0500 01/07/19 0459 01/08/19 0500 01/09/19 0500  WBC 3.2* 3.8* 2.5* 4.4 4.4  NEUTROABS 1.5* 2.2 1.4* 2.6 2.1  HGB 13.6 13.9 12.8 13.3 13.4  HCT 40.9 41.2 39.5 39.1 40.2  MCV 88.0 87.3 89.0 85.6 86.5  PLT 127* 136* 165 205 [id number]   Basic Metabolic Panel: Recent Labs  Lab 01/05/19 0500 01/06/19 0500 01/06/19 1132 01/07/19 0459 01/08/19 0500 01/09/19 0500  NA 139 140  --  141 142 142  K 3.5 3.4*  --  3.8 3.7 3.5  CL 103 102  --  107 106 105  CO2 27 27  --  23 27 27   GLUCOSE 97 91  --  96 99 89  BUN 10 9  --  13 17 17   CREATININE 0.95 0.70  --  0.57 0.62 0.65  CALCIUM 8.3* 8.5*  --  8.6* 8.7* 8.6*  MG  --   --  1.9  --   --   --    GFR: Estimated Creatinine Clearance: 74.5 mL/min (by C-G formula based on SCr of 0.65 mg/dL). Liver Function Tests: Recent Labs  Lab 01/05/19 0500 01/06/19 0500 01/07/19 0459 01/08/19 0500 01/09/19 0500  AST 54* 52* 43* 38 35  ALT 49* 64* 62* 59* 52*  ALKPHOS 38 41 42 41 39  BILITOT 0.6 0.9 0.7 1.0 0.7  PROT 5.7* 6.0* 6.0* 5.8* 5.7*  ALBUMIN 2.9* 3.1* 3.0* 2.9* 2.9*   No results for input(s):  LIPASE, AMYLASE in the last 168 hours. No results for input(s): AMMONIA in the last 168 hours. Coagulation Profile: No results for input(s): INR, PROTIME in the last 168 hours. Cardiac Enzymes: Recent Labs  Lab 01/03/19 0550  CKTOTAL 411*   BNP (last 3 results) No results for input(s): PROBNP in the last 8760 hours. HbA1C: No results for input(s): HGBA1C in the last 72 hours. CBG: No results for input(s): GLUCAP in the last 168 hours. Lipid Profile: No results for input(s): CHOL, HDL, LDLCALC, TRIG, CHOLHDL, LDLDIRECT in the last 72 hours. Thyroid Function Tests: No results for input(s): TSH, T4TOTAL, FREET4, T3FREE, THYROIDAB in the last 72 hours. Anemia Panel: Recent Labs    01/08/19 0500 01/09/19 0500  FERRITIN 617* 609*   Urine analysis:    Component Value Date/Time   COLORURINE YELLOW 01/01/2019 1730   APPEARANCEUR HAZY (A) 01/01/2019 1730   LABSPEC 1.029 01/01/2019 1730   PHURINE 5.0 01/01/2019 1730   GLUCOSEU NEGATIVE 01/01/2019 1730   HGBUR NEGATIVE 01/01/2019  [place] 01/01/2019 1730   KETONESUR 20 (A) 01/01/2019 1730   PROTEINUR 30 (A) 01/01/2019 1730   NITRITE NEGATIVE 01/01/2019 1730   LEUKOCYTESUR NEGATIVE 01/01/2019 1730   Sepsis Labs: @LABRCNTIP (procalcitonin:4,lacticidven:4)  ) Recent Results (from the past 240 hour(s))  Urine culture     Status: Abnormal   Collection Time: 01/01/19  5:23 PM   Specimen: Urine, Random  Result Value Ref Range Status   Specimen Description URINE, RANDOM  Final   Special Requests   Final    NONE Performed at [facility], [place] [address], [place], [place] [address]    Culture 20,000 COLONIES/mL ESCHERICHIA COLI (A)  Final   Report Status 01/03/2019 FINAL  Final   Organism ID, Bacteria ESCHERICHIA COLI (A)  Final      Susceptibility   Escherichia coli - MIC*    AMPICILLIN 16 INTERMEDIATE Intermediate     CEFAZOLIN <=4 SENSITIVE Sensitive     CEFTRIAXONE <=1 SENSITIVE Sensitive      CIPROFLOXACIN <=0.25 SENSITIVE Sensitive     GENTAMICIN <=1 SENSITIVE Sensitive     IMIPENEM <=0.25 SENSITIVE Sensitive     NITROFURANTOIN <=16 SENSITIVE Sensitive     TRIMETH/SULFA <=20 SENSITIVE Sensitive     AMPICILLIN/SULBACTAM 4 SENSITIVE Sensitive     PIP/TAZO <=4 SENSITIVE Sensitive     Extended ESBL NEGATIVE Sensitive     * 20,000 COLONIES/mL ESCHERICHIA COLI  MRSA PCR Screening     Status: None   Collection Time: 01/02/19 10:27 PM   Specimen: Nasal Mucosa; Nasopharyngeal  Result Value Ref Range Status   MRSA by PCR NEGATIVE NEGATIVE Final    Comment:        The GeneXpert MRSA Assay (FDA approved for NASAL specimens only), is one component of a comprehensive MRSA colonization surveillance program. It is not intended to diagnose MRSA infection nor to guide or monitor treatment for MRSA infections. Performed at [facility], [place] [address], [place], [place] [address]   Culture, blood (routine x 2)     Status: None   Collection Time: 01/03/19  9:10 PM   Specimen: BLOOD  Result Value Ref Range Status   Specimen Description BLOOD RIGHT HAND  Final   Special Requests   Final    BOTTLES DRAWN AEROBIC AND ANAEROBIC Blood Culture adequate volume   Culture   Final    NO GROWTH 5 DAYS Performed at [facility], [place] [address], [place], [place] [address]    Report Status 01/09/2019 FINAL  Final  Culture, blood (routine x 2)     Status: None   Collection Time: 01/03/19  9:18 PM   Specimen: BLOOD  Result Value Ref Range Status   Specimen Description BLOOD RIGHT HAND  Final   Special Requests   Final    BOTTLES DRAWN AEROBIC AND ANAEROBIC Blood Culture results may not be optimal due to an inadequate volume of blood received in culture bottles   Culture   Final    NO GROWTH 5 DAYS Performed at [facility], [place] [address] [place], [place] [address]    Report Status 01/09/2019 FINAL  Final      Studies: No results found.  Scheduled Meds: . albuterol   2 puff Inhalation Q8H  . chlorhexidine  15 mL Mouth Rinse BID  . dexamethasone (DECADRON) injection  6 mg Intravenous Q24H  . divalproex  250 mg Oral BID  . donepezil  10 mg Oral QHS  .  enoxaparin (LOVENOX) injection  40 mg Subcutaneous Daily  . escitalopram  10 mg Oral Daily  . ipratropium  2 puff Inhalation Q8H  . loratadine  10 mg Oral Daily  . mouth rinse  15 mL Mouth Rinse q12n4p  . memantine  28 mg Oral Daily  . saccharomyces boulardii  250 mg Oral Daily    Continuous Infusions: . remdesivir 100 mg in NS 100 mL 100 mg (01/09/19 0938)     LOS: 7 days     Kayleen Memos, MD [facility] Pager [phone]  If 7PM-7AM, please contact night-coverage [email or web address] Password [facility] 01/09/2019, 12:17 PM

## 2019-01-10 MED ORDER — DIVALPROEX SODIUM 125 MG PO CSDR
250.0000 mg | DELAYED_RELEASE_CAPSULE | Freq: Two times a day (BID) | ORAL | Status: DC
  Administered 2019-01-11 – 2019-01-13 (×5): 250 mg via ORAL
  Filled 2019-01-10 (×5): qty 2

## 2019-01-10 MED ORDER — ENSURE ENLIVE PO LIQD
237.0000 mL | Freq: Three times a day (TID) | ORAL | Status: DC
  Administered 2019-01-10 – 2019-01-13 (×8): 237 mL via ORAL

## 2019-01-10 MED ORDER — ADULT MULTIVITAMIN W/MINERALS CH
1.0000 | ORAL_TABLET | Freq: Every day | ORAL | Status: DC
  Administered 2019-01-11 – 2019-01-13 (×3): 1 via ORAL
  Filled 2019-01-10 (×3): qty 1

## 2019-01-10 NOTE — Progress Notes (Signed)
Initial Nutrition Assessment  RD working remotely.  DOCUMENTATION CODES:   Obesity unspecified  INTERVENTION:   - Ensure Enlive po TID, each supplement provides 350 kcal and 20 grams of protein  - MVI with minerals daily  - Encourage adequate PO intake and provide feeding assistance  NUTRITION DIAGNOSIS:   Increased nutrient needs related to acute illness (COVID-19 pneumonia) as evidenced by estimated needs.  GOAL:   Patient will meet greater than or equal to 90% of their needs  MONITOR:   PO intake, Supplement acceptance, Labs, Weight trends  REASON FOR ASSESSMENT:   Low Braden    ASSESSMENT:   63 year old female who presented to the ED on 12/06 with AMS. PMH of advanced Alzheimer's dementia, HTN, HLD. Pt tested positive for COVID-19.   12/08 - SLP evaluation with recommendations for Dysphagia 3 diet with thin liquids  Pt now with viral PNA.  Noted plan for pt to d/c to SNF when bed is available.  Did not attempt to call pt due to dementia diagnosis.  Reviewed weight history in chart. Pt with a 7.3 kg weight loss since 04/12/18. This is an 8.2% weight loss in 9 months which is not significant for timeframe but is concerning.  RD will order oral nutrition supplements to aid pt in meeting kcal and protein needs during admission.  Meal Completion: 10-80% x last 8 meals (averaging 46%)  Medications reviewed and include: decadron, Florastor  Labs reviewed.  NUTRITION - FOCUSED PHYSICAL EXAM:  Unable to complete at this time. RD working remotely.  Diet Order:   Diet Order            DIET DYS 3 Room service appropriate? Yes; Fluid consistency: Thin  Diet effective now              EDUCATION NEEDS:   No education needs have been identified at this time  Skin:  Skin Assessment: Reviewed RN Assessment (MASD to groin and sacrum)  Last BM:  01/08/19 small type 6  Height:   Ht Readings from Last 1 Encounters:  01/02/19 5\' 3"  (1.6 m)    Weight:    Wt Readings from Last 1 Encounters:  01/05/19 81.2 kg    Ideal Body Weight:  52.3 kg  BMI:  Body mass index is 31.71 kg/m.  Estimated Nutritional Needs:   Kcal:  1800-2000  Protein:  85-100 grams  Fluid:  >/= 1.8 L    Gaynell Face, MS, RD, LDN Inpatient Clinical Dietitian Pager: [phone] Weekend/After Hours: [phone]

## 2019-01-10 NOTE — Progress Notes (Signed)
I attempted several times over the course of the day to connect with nursing regarding assistance with telemedicine evaluation of Barbara Gill for appropriate medical distancing with COVID 19 infection. Given constraints of patient care, nursing was unable to assist in the consultation, which was deemed necessary given Barbara Gill's dementia. Consult was placed for asymptomatic hemodynamically stable sinus bradycardia with rates in the 40s and 50s.   Telemetry has been reviewed and appears stable over the day. No medication changes are recommended currently to assist with management of bradycardia. Consultation will be attempted again tomorrow, or more urgently if indicated by primary service.

## 2019-01-10 NOTE — Progress Notes (Addendum)
PROGRESS NOTE  Barbara Gill [id number] DOB: 08-06-1955 DOA: 01/01/2019 PCP: Marton Redwood, MD  HPI/Recap of past 9 hours: 63 year old female with past medical history significant for advanced Alzheimer's dementia, OSA, primary progressive aphasia, chronic depression, who presented from ALF due to sudden behavioral change.  Patient babbles at baseline and was babbling significantly less at her ALF.  Staff became concerned and called EMS.  On presentation to the ED, CT head and MRI brain unremarkable for any acute intracranial findings.  Positive COVID-19 test on 01/01/2019.  Admission x-ray showed no lobular infiltrates, not hypoxic.  Not started on COVID-19 directed therapy remdesivir or Decadron.   Had left hip pain, x-ray and MRI ruled out a left hip fracture.  On 01/06/19 developed hypoxia.  CXR showed markings suggestive of viral PNA.  Started on COVID-19 directed therapy and completed 5 days of Remdesivir.  Hospital course complicated by unexplained bradycardia. Not on BB or CCB.  Cardiology consulted to assist with the management of her bradycardia. No known cardiac history.  Limited 2D echo ordered and pending.  01/11/19: Seen and examined.  Bubbles and unable to provide a history.  Persistently bradycardic.  12 lead EKG ordered and pending.  Discussed with cardiology Dr. Margaretann Loveless, awaiting EKG.   Assessment/Plan: Principal Problem:   Acute encephalopathy Active Problems:   COVID-19 virus infection   Hypotension   Hip tenderness, left   Sinus bradycardia   Resolving acute metabolic encephalopathy likely in the setting of COVID-19 viral infection CT head admission unremarkable for any acute intracranial findings MRI brain unremarkable for any acute intracranial findings TSH, B12 normal Ammonia level within normal limits urine Culture 20,000 colonies of E. Coli.  Received 1 dose of fosfomycin.  Acute COVID-19 viral infection now with viral PNA Positive Covid-19  01/01/19 Independently reviewed chest x-ray done on admission which shows no lobular infiltrates Repeat chest x-ray done due to hypoxia showed patchy infiltrates left greater than right 12/11.  Was having worsening inflammatory markers and was started on remdesivir and Decadron. She has completed 5 days of remdesivir. Currently on IV Decadron 6 mg daily, to complete 10 days Inflammatory markers are trending down Continue inhalers albuterol 2 puffs and ipratropium 2 puffs 3 times daily Continue vitamin C D3 and zinc  Bradycardia, unclear etiology Rate in the 40s TSH normal Obtain twelve-lead EKG 2D echo ordered and pending No known prior cardiac history Discussed with cardiology Dr. Margaretann Loveless, awaiting 12 lead EKG.  Physical debility/ambulatory dysfunction Completed PT assessment with recommendation for SNF CSW assisting with SNF placement. Continue PT OT with assistance and fall precautions  Resolved acute leukopenia in the setting of acute COVID-19 viral infection  Resolving Acute hypoxic respiratory failure Not on oxygen supplementation at baseline Hypoxia improved once started on remdesivir and Decadron O2 saturation 98% on room air  Resolved post repletion: Hypokalemia  Presumed uncomplicated UTI, treated Urine culture 20,000 E. coli colonies with intermediate ampicillin Source of confusion? AMS? Received 1 dose of fosfomycin on 01/03/2019.  Left hip pain, fracture ruled out.  X-ray findings indeterminate for a nondisplaced subcapital fracture of the left femoral neck.  Left hip MRI no evidence of fracture   Mild elevated CPK CPK is trending down  Alzheimer's dementia Continue home medications  Chronic depression/anxiety Continue home meds     DVT prophylaxis: Lovenox subcu daily Code Status: Full code Family Communication:  None at bedside.   Disposition Plan:  Plan to discharge to SNF in the next 24 to 48 hours after  cardiology's recommendations and when SNF  bed is available.   Objective: Vitals:   01/09/19 1700 01/09/19 1854 01/09/19 2004 01/10/19 0400  BP:  128/67 129/68 130/90  Pulse:  60 64 61  Resp:  15  16  Temp: (!) 97.4 F (36.3 C)  98.4 F (36.9 C) 98.1 F (36.7 C)  TempSrc:   Oral Axillary  SpO2:   96% 95%  Weight:      Height:        Intake/Output Summary (Last 24 hours) at 01/10/2019 1344 Last data filed at 01/10/2019 0600 Gross per 24 hour  Intake 320 ml  Output 700 ml  Net -380 ml   Filed Weights   01/02/19 2209 01/05/19 0424  Weight: 81.1 kg 81.2 kg    Exam:  . General: 63 y.o. year-old female well-nourished well-developed no acute distress.  Babbles.  Cardiovascular: Bradycardic no rubs or gallops.  No thyromegaly or JVD. Respiratory: Clear to auscultation no wheezes or rales.  Poor inspiratory effort.   \Abdomen: Soft nontender normal bowel sounds present.   Musculoskeletal: No lower extremity edema.  2/4 pulses in all 4 extremities. Psychiatry: Mood is appropriate for condition and setting.  Data Reviewed: CBC: Recent Labs  Lab 01/05/19 0500 01/06/19 0500 01/07/19 0459 01/08/19 0500 01/09/19 0500  WBC 3.2* 3.8* 2.5* 4.4 4.4  NEUTROABS 1.5* 2.2 1.4* 2.6 2.1  HGB 13.6 13.9 12.8 13.3 13.4  HCT 40.9 41.2 39.5 39.1 40.2  MCV 88.0 87.3 89.0 85.6 86.5  PLT 127* 136* 165 205 [id number]   Basic Metabolic Panel: Recent Labs  Lab 01/05/19 0500 01/06/19 0500 01/06/19 1132 01/07/19 0459 01/08/19 0500 01/09/19 0500  NA 139 140  --  141 142 142  K 3.5 3.4*  --  3.8 3.7 3.5  CL 103 102  --  107 106 105  CO2 27 27  --  23 27 27   GLUCOSE 97 91  --  96 99 89  BUN 10 9  --  13 17 17   CREATININE 0.95 0.70  --  0.57 0.62 0.65  CALCIUM 8.3* 8.5*  --  8.6* 8.7* 8.6*  MG  --   --  1.9  --   --   --    GFR: Estimated Creatinine Clearance: 74.5 mL/min (by C-G formula based on SCr of 0.65 mg/dL). Liver Function Tests: Recent Labs  Lab 01/05/19 0500 01/06/19 0500 01/07/19 0459 01/08/19 0500 01/09/19 0500   AST 54* 52* 43* 38 35  ALT 49* 64* 62* 59* 52*  ALKPHOS 38 41 42 41 39  BILITOT 0.6 0.9 0.7 1.0 0.7  PROT 5.7* 6.0* 6.0* 5.8* 5.7*  ALBUMIN 2.9* 3.1* 3.0* 2.9* 2.9*   No results for input(s): LIPASE, AMYLASE in the last 168 hours. No results for input(s): AMMONIA in the last 168 hours. Coagulation Profile: No results for input(s): INR, PROTIME in the last 168 hours. Cardiac Enzymes: No results for input(s): CKTOTAL, CKMB, CKMBINDEX, TROPONINI in the last 168 hours. BNP (last 3 results) No results for input(s): PROBNP in the last 8760 hours. HbA1C: No results for input(s): HGBA1C in the last 72 hours. CBG: No results for input(s): GLUCAP in the last 168 hours. Lipid Profile: No results for input(s): CHOL, HDL, LDLCALC, TRIG, CHOLHDL, LDLDIRECT in the last 72 hours. Thyroid Function Tests: No results for input(s): TSH, T4TOTAL, FREET4, T3FREE, THYROIDAB in the last 72 hours. Anemia Panel: Recent Labs    01/08/19 0500 01/09/19 0500  FERRITIN 617* 609*   Urine analysis:  Component Value Date/Time   COLORURINE YELLOW 01/01/2019 1730   APPEARANCEUR HAZY (A) 01/01/2019 1730   LABSPEC 1.029 01/01/2019 1730   PHURINE 5.0 01/01/2019 1730   GLUCOSEU NEGATIVE 01/01/2019 1730   HGBUR NEGATIVE 01/01/2019 1730   BILIRUBINUR NEGATIVE 01/01/2019 1730   KETONESUR 20 (A) 01/01/2019 1730   PROTEINUR 30 (A) 01/01/2019 1730   NITRITE NEGATIVE 01/01/2019 1730   LEUKOCYTESUR NEGATIVE 01/01/2019 1730   Sepsis Labs: @LABRCNTIP (procalcitonin:4,lacticidven:4)  ) Recent Results (from the past 240 hour(s))  Urine culture     Status: Abnormal   Collection Time: 01/01/19  5:23 PM   Specimen: Urine, Random  Result Value Ref Range Status   Specimen Description URINE, RANDOM  Final   Special Requests   Final    NONE Performed at [facility], [place] [address] [place], [place] [address]    Culture 20,000 COLONIES/mL ESCHERICHIA COLI (A)  Final   Report Status 01/03/2019 FINAL  Final    Organism ID, Bacteria ESCHERICHIA COLI (A)  Final      Susceptibility   Escherichia coli - MIC*    AMPICILLIN 16 INTERMEDIATE Intermediate     CEFAZOLIN <=4 SENSITIVE Sensitive     CEFTRIAXONE <=1 SENSITIVE Sensitive     CIPROFLOXACIN <=0.25 SENSITIVE Sensitive     GENTAMICIN <=1 SENSITIVE Sensitive     IMIPENEM <=0.25 SENSITIVE Sensitive     NITROFURANTOIN <=16 SENSITIVE Sensitive     TRIMETH/SULFA <=20 SENSITIVE Sensitive     AMPICILLIN/SULBACTAM 4 SENSITIVE Sensitive     PIP/TAZO <=4 SENSITIVE Sensitive     Extended ESBL NEGATIVE Sensitive     * 20,000 COLONIES/mL ESCHERICHIA COLI  MRSA PCR Screening     Status: None   Collection Time: 01/02/19 10:27 PM   Specimen: Nasal Mucosa; Nasopharyngeal  Result Value Ref Range Status   MRSA by PCR NEGATIVE NEGATIVE Final    Comment:        The GeneXpert MRSA Assay (FDA approved for NASAL specimens only), is one component of a comprehensive MRSA colonization surveillance program. It is not intended to diagnose MRSA infection nor to guide or monitor treatment for MRSA infections. Performed at [facility], [place] [address], [place], [place] [address]   Culture, blood (routine x 2)     Status: None   Collection Time: 01/03/19  9:10 PM   Specimen: BLOOD  Result Value Ref Range Status   Specimen Description BLOOD RIGHT HAND  Final   Special Requests   Final    BOTTLES DRAWN AEROBIC AND ANAEROBIC Blood Culture adequate volume   Culture   Final    NO GROWTH 5 DAYS Performed at [facility], [place] [address], [place], [place] [address]    Report Status 01/09/2019 FINAL  Final  Culture, blood (routine x 2)     Status: None   Collection Time: 01/03/19  9:18 PM   Specimen: BLOOD  Result Value Ref Range Status   Specimen Description BLOOD RIGHT HAND  Final   Special Requests   Final    BOTTLES DRAWN AEROBIC AND ANAEROBIC Blood Culture results may not be optimal due to an inadequate volume of blood received in culture bottles    Culture   Final    NO GROWTH 5 DAYS Performed at [facility], [place] [address] [place], [place] [address]    Report Status 01/09/2019 FINAL  Final      Studies: No results found.  Scheduled Meds: . albuterol  2  puff Inhalation Q8H  . chlorhexidine  15 mL Mouth Rinse BID  . dexamethasone (DECADRON) injection  6 mg Intravenous Q24H  . divalproex  250 mg Oral BID  . donepezil  10 mg Oral QHS  . enoxaparin (LOVENOX) injection  40 mg Subcutaneous Daily  . escitalopram  10 mg Oral Daily  . ipratropium  2 puff Inhalation Q8H  . loratadine  10 mg Oral Daily  . mouth rinse  15 mL Mouth Rinse q12n4p  . memantine  28 mg Oral Daily  . saccharomyces boulardii  250 mg Oral Daily    Continuous Infusions:    LOS: 8 days     Kayleen Memos, MD [facility] Pager [phone]  If 7PM-7AM, please contact night-coverage [email or web address] Password [email or web address] 01/10/2019, 1:44 PM

## 2019-01-10 NOTE — Progress Notes (Signed)
Physical Therapy Treatment Patient Details Name: Barbara Gill MRN: [id number] DOB: 10/28/1955 Today's Date: 01/10/2019    History of Present Illness 63 year old female with past medical history significant for advanced Alzheimer's dementia, OSA, primary progressive aphasia, chronic depression, who presented from SNF due to behavioral change.  Normally ambulatory with 1 person assist or walker, and pt would not ambulate.  Noted question hip fx  on x-ray, but MRI negative.  Covid +.    PT Comments    Pt awake, babbling and appeared calm and pleasant throughout session. Pt engaging in assisting with trunk elevation from surface to sitting but unable to maintain sitting or follow commands for HEP. Pt with resistance to LLE hip mobility in supine and unable to perform full PROM. Goals downgraded and will continue to attempt to return to mobility given pt was walking PTA. Pt in chair position in bed end of session. HR 44 VSS    Follow Up Recommendations  SNF     Equipment Recommendations  None recommended by PT    Recommendations for Other Services       Precautions / Restrictions Precautions Precautions: Fall Precaution Comments: Alzheimers    Mobility  Bed Mobility Overal bed mobility: Needs Assistance Bed Mobility: Supine to Sit;Sit to Supine     Supine to sit: Max assist;HOB elevated Sit to supine: HOB elevated;Total assist   General bed mobility comments: total assist to pivot lower body to EOB then max assist with bil HHA with multimodal cueing for pt to assist with elevating trunk from surface but will only maintain grossly 10 sec then lower trunk to surface again. Repeated supine<>sit x 4 with pt unable to maintain and increased posterior resistance if tactile cues to back. Total assist to return to supine and slide toward [facility]  Transfers                 General transfer comment: unable to attempt  Ambulation/Gait                 Stairs              Wheelchair Mobility    Modified Rankin (Stroke Patients Only)       Balance Overall balance assessment: Needs assistance Sitting-balance support: Feet supported Sitting balance-Leahy Scale: Zero Sitting balance - Comments: pt with posterior lean and requires bil UE support                                    Cognition Arousal/Alertness: Awake/alert Behavior During Therapy: WFL for tasks assessed/performed Overall Cognitive Status: No family/caregiver present to determine baseline cognitive functioning                                 General Comments: pt babbling at times with a periodic "yes" or intelligible statement. Pt with eyes open and will follow tactile/visual cues very sporadically grossly 10% of the time and only maintain grossly 10 sec      Exercises General Exercises - Lower Extremity Short Arc Quad: PROM;Both;10 reps;Seated Hip ABduction/ADduction: PROM;Both;10 reps;Supine Hip Flexion/Marching: PROM;Both;10 reps;Supine    General Comments        Pertinent Vitals/Pain Pain Assessment: (PAINAD= 1)    Home Living                      Prior Function  PT Goals (current goals can now be found in the care plan section) Acute Rehab PT Goals Time For Goal Achievement: 01/24/19 Potential to Achieve Goals: Fair Progress towards PT goals: Not progressing toward goals - comment;Goals downgraded-see care plan    Frequency    Min 2X/week      PT Plan Current plan remains appropriate    Co-evaluation              AM-PAC PT "6 Clicks" Mobility   Outcome Measure  Help needed turning from your back to your side while in a flat bed without using bedrails?: Total Help needed moving from lying on your back to sitting on the side of a flat bed without using bedrails?: Total Help needed moving to and from a bed to a chair (including a wheelchair)?: Total Help needed standing up from a chair using your  arms (e.g., wheelchair or bedside chair)?: Total Help needed to walk in hospital room?: Total Help needed climbing 3-5 steps with a railing? : Total 6 Click Score: 6    End of Session   Activity Tolerance: Patient tolerated treatment well Patient left: in bed;with call bell/phone within reach;with bed alarm set Nurse Communication: Mobility status PT Visit Diagnosis: Other symptoms and signs involving the nervous system (R29.898);Other abnormalities of gait and mobility (R26.89)     Time: [id number] PT Time Calculation (min) (ACUTE ONLY): 18 min  Charges:  $Therapeutic Activity: 8-22 mins                     Teddie Curd P, PT Acute Rehabilitation Services Pager: [phone] Office: [place] 01/10/2019, 1:36 PM

## 2019-01-11 DIAGNOSIS — U071 COVID-19: Principal | ICD-10-CM

## 2019-01-11 DIAGNOSIS — R001 Bradycardia, unspecified: Secondary | ICD-10-CM

## 2019-01-11 MED ORDER — DEXAMETHASONE 6 MG PO TABS
6.0000 mg | ORAL_TABLET | Freq: Every day | ORAL | Status: DC
  Administered 2019-01-12 – 2019-01-13 (×2): 6 mg via ORAL
  Filled 2019-01-11 (×2): qty 1

## 2019-01-11 NOTE — Consult Note (Signed)
Cardiology Consultation:   Due to the COVID-19 pandemic, this visit was completed with telemedicine (audio/video) technology to reduce patient and provider exposure as well as to preserve personal protective equipment.   Patient ID: MERTHA CLUKEY MRN: [id number]; DOB: [birth year]  Admit date: 01/01/2019 Date of Consult: 01/11/2019  Primary Care Provider: Marton Redwood, MD Primary Cardiologist: No primary care provider on file.  Primary Electrophysiologist:  None    Patient Profile:   Barbara Gill is a 63 y.o. female with a hx of advanced Alzheimer's dementia, hypertension, hyperlipidemia who is being seen today for the evaluation of asymptomatic hemodynamically stable sinus bradycardia at the request of Dr. Irene Pap.  History of Present Illness:   Barbara Gill is a 63 year old female with advanced Alzheimer's dementia, hypertension, hyperlipidemia who presented from a nursing home for evaluation of behavioral change.  EMS reported the patient is usually ambulatory with a walker or 1 person assist but was unable to walk on the day of admission.  She is nonverbal at baseline but does babble, and was noted to babble less the day of admission.  Per chart review it appears her husband states that when she babbles it is a good sign.  In the emergency department she tested positive for COVID-19 infection.  She was hypotensive on arrival with a blood pressure of 79/62.  She was hydrated and blood pressure improved she was not tachycardic, tachypneic, or hypoxic on admission.  She had normal WBC and lactic acid.  No other systemic infection identified.  On chart review she has no significant history of coronary artery disease or systolic dysfunction.  She has no signs on presentation to the hospital of ischemia.  She has been noted in the last several days to have sinus bradycardia with rates in the high 40s and 50s.  No concerning hemodynamic changes with this bradycardia.  She does have a  history of obstructive sleep apnea.  She previously followed with neurology for sleep apnea with last appointment note in September 2017.  She was intermittently compliant with CPAP at that time.  She has had sinus bradycardia on telemetry as noted in the past 24 hours.  She did have 1 telemetry finding of first-degree AV block.  Her blood pressure has been hemodynamically stable.  I have attempted to contact her husband on both her home phone and cell phone and was unable to connect.  I left a message and have suggested that if he has questions we can discuss it at another time.  I have also attempted over the last approximately 36 hours to connect with nursing to assist with a telehealth evaluation of the patient.  There are no video capabilities in the patient's room.  Due to the patient's dementia and nonverbal status, telephone evaluation with the patient would not contribute in a meaningful clinical way to consultation.  Past Medical History:  Diagnosis Date  . Alzheimer's disease ([place])   . High cholesterol   . Hyperlipemia   . Hypertension     History reviewed. No pertinent surgical history.   Home Medications:  Prior to Admission medications   Medication Sig Start Date End Date Taking? Authorizing Provider  cetirizine (ZYRTEC) 10 MG tablet Take 10 mg by mouth daily.   Yes [provider]  divalproex (DEPAKOTE ER) 250 MG 24 hr tablet Take 250 mg by mouth 2 (two) times daily.   Yes Marton Redwood, MD  donepezil (ARICEPT) 10 MG tablet Take 10 mg by mouth at bedtime.  Yes [provider]  escitalopram (LEXAPRO) 10 MG tablet Take 10 mg by mouth daily.   Yes [provider]  Memantine HCl ER (NAMENDA XR) 28 MG CP24 Take 28 mg by mouth daily.   Yes [provider]  saccharomyces boulardii (FLORASTOR) 250 MG capsule Take 250 mg by mouth daily.   Yes [provider]  cephALEXin (KEFLEX) 500 MG capsule Take 1 capsule (500 mg total) by mouth 3  (three) times daily. Patient not taking: Reported on 01/01/2019 04/12/18   Ward, Ozella Almond, PA-C    Inpatient Medications: Scheduled Meds: . albuterol  2 puff Inhalation Q8H  . chlorhexidine  15 mL Mouth Rinse BID  . [START ON 01/12/2019] dexamethasone  6 mg Oral Daily  . divalproex  250 mg Oral Q12H  . enoxaparin (LOVENOX) injection  40 mg Subcutaneous Daily  . escitalopram  10 mg Oral Daily  . feeding supplement (ENSURE ENLIVE)  237 mL Oral TID BM  . ipratropium  2 puff Inhalation Q8H  . loratadine  10 mg Oral Daily  . mouth rinse  15 mL Mouth Rinse q12n4p  . memantine  28 mg Oral Daily  . multivitamin with minerals  1 tablet Oral Daily  . saccharomyces boulardii  250 mg Oral Daily   Continuous Infusions:  PRN Meds: acetaminophen **OR** acetaminophen  Allergies:    Allergies  Allergen Reactions  . Seasonal Ic [Cholestatin]     Runny nose, itchy eyes  . Poison Ivy Extract [Poison Ivy Extract] Rash    Social History:   Social History   Socioeconomic History  . Marital status: Married    Spouse name: [name]  . Number of children: 2  . Years of education: 1  . Highest education level: Not on file  Occupational History    Comment: not employed  Tobacco Use  . Smoking status: Current Every Day Smoker  . Smokeless tobacco: Never Used  . Tobacco comment: 1 pack weekly   Substance and Sexual Activity  . Alcohol use: Yes    Alcohol/week: 0.0 standard drinks    Comment: occas.,once every two months  . Drug use: No  . Sexual activity: Not on file  Other Topics Concern  . Not on file  Social History Narrative   Patient is right handed and resides with husband   Social Determinants of Health   Financial Resource Strain:   . Difficulty of Paying Living Expenses: Not on file  Food Insecurity:   . Worried About Charity fundraiser in the Last Year: Not on file  . Ran Out of Food in the Last Year: Not on file  Transportation Needs:   . Lack of Transportation  (Medical): Not on file  . Lack of Transportation (Non-Medical): Not on file  Physical Activity:   . Days of Exercise per Week: Not on file  . Minutes of Exercise per Session: Not on file  Stress:   . Feeling of Stress : Not on file  Social Connections:   . Frequency of Communication with Friends and Family: Not on file  . Frequency of Social Gatherings with Friends and Family: Not on file  . Attends Religious Services: Not on file  . Active Member of Clubs or Organizations: Not on file  . Attends Archivist Meetings: Not on file  . Marital Status: Not on file  Intimate Partner Violence:   . Fear of Current or Ex-Partner: Not on file  . Emotionally Abused: Not on file  .  Physically Abused: Not on file  . Sexually Abused: Not on file    Family History:    Family History  Problem Relation Age of Onset  . Cancer Father   . Hypothyroidism Father   . Hypertension Mother        borderline  . Diabetes Mother   . Breast cancer Mother      ROS:  Please see the history of present illness.   All other ROS reviewed and negative.     Physical Exam/Data:   Vitals:   01/11/19 0800 01/11/19 1200 01/11/19 1434 01/11/19 1454  BP: 138/67 122/64 127/72 (P) 122/77  Pulse: (!) 52 (!) 47 (!) 53   Resp: 15 (!) 23 18   Temp: 98.3 F (36.8 C) 98.9 F (37.2 C) 98.5 F (36.9 C)   TempSrc:  Axillary Oral   SpO2: 90% 95% 98%   Weight:      Height:        Intake/Output Summary (Last 24 hours) at 01/11/2019 1559 Last data filed at 01/11/2019 1400 Gross per 24 hour  Intake 550 ml  Output 400 ml  Net 150 ml   Last 3 Weights 01/05/2019 01/02/2019 04/12/2018  Weight (lbs) 179 lb 0.2 oz 178 lb 12.7 oz 195 lb  Weight (kg) 81.2 kg 81.1 kg 88.451 kg     Body mass index is 31.71 kg/m.   VITAL SIGNS:  reviewed  EKG:  The EKG was personally reviewed and demonstrates: Sinus bradycardia, incomplete right bundle branch block, possible inferior infarct age-indeterminate. Telemetry:   Telemetry was personally reviewed and demonstrates: Sinus bradycardia and sinus bradycardia with first-degree AV block.  Rates are approximately 45 to 60 bpm.  Relevant CV Studies: None available  Laboratory Data:  Chemistry Recent Labs  Lab 01/07/19 0459 01/08/19 0500 01/09/19 0500  NA 141 142 142  K 3.8 3.7 3.5  CL 107 106 105  CO2 23 27 27   GLUCOSE 96 99 89  BUN 13 17 17   CREATININE 0.57 0.62 0.65  CALCIUM 8.6* 8.7* 8.6*  GFRNONAA >60 >60 >60  GFRAA >60 >60 >60  ANIONGAP 11 9 10     Recent Labs  Lab 01/07/19 0459 01/08/19 0500 01/09/19 0500  PROT 6.0* 5.8* 5.7*  ALBUMIN 3.0* 2.9* 2.9*  AST 43* 38 35  ALT 62* 59* 52*  ALKPHOS 42 41 39  BILITOT 0.7 1.0 0.7   Hematology Recent Labs  Lab 01/07/19 0459 01/08/19 0500 01/09/19 0500  WBC 2.5* 4.4 4.4  RBC 4.44 4.57 4.65  HGB 12.8 13.3 13.4  HCT 39.5 39.1 40.2  MCV 89.0 85.6 86.5  MCH 28.8 29.1 28.8  MCHC 32.4 34.0 33.3  RDW 12.4 12.4 12.2  PLT 165 205 239   Cardiac EnzymesNo results for input(s): TROPONINI in the last 168 hours. No results for input(s): TROPIPOC in the last 168 hours.  BNPNo results for input(s): BNP, PROBNP in the last 168 hours.  DDimer  Recent Labs  Lab 01/07/19 0459 01/08/19 0500 01/09/19 0500  DDIMER 0.59* 0.50 0.46    Radiology/Studies:  No results found.   Assessment and Plan:   1. Sinus bradycardia 2. Advanced Alzheimer's dementia 3. Hypertension 4. Hyperlipidemia 5. COVID-19 positive infection  The patient is currently hospitalized for COVID-19 infection.  She is hemodynamically stable and on chart review and review of vital signs, hospitalist documentation appears stable and asymptomatic from the standpoint of bradycardia.  She has no known cardiovascular history.  She has sleep apnea that is likely intermittently treated  though I cannot confirm her compliance.  At this time observation of her bradycardia is recommended.  She is on no AV nodal blocking agents that  would be contributing to bradycardia.  Her blood pressures have been optimal, and there is no current indication for pacing.  In addition to that, with her advanced dementia, it is unlikely that an evaluation with electrophysiology would result in permanent pacemaker placement.  If she shows signs of hemodynamic instability, I would recommend ACLS and pacemaker pad placement.  However she has been asymptomatic and hemodynamically stable.  No strong indication for an inpatient echocardiogram, however if she shows signs of hemodynamic instability or change in clinical status, echocardiogram can be revisited.  It would be reasonable to obtain an echocardiogram at close follow-up after hospitalization when she has recovered from her acute illness.   There is limited information on telemetry as only 2 strips have been saved and these do not appear to have occurred when the patient was sleeping.  Her heart rate during this hospitalization did increase to 73 bpm on December 8 in the mid afternoon, suggesting that she is able to elevate her heart rate somewhat.  I have reviewed all ECGs from this hospitalization.  No previous ECGs are available for review.  However during ER visit in March 2020 her pulse was recorded at 63 bpm.  I would recommend a strategy of observation.  An echocardiogram can likely be obtained in close follow-up given that she has ECG findings of a possible inferior infarct.  In the setting of a COVID-19 infection, urgent echocardiography may not be necessary for appropriate medical distancing.  As noted before if she shows any signs of hemodynamic compromise, echocardiography can be obtained to assess inferior wall motion and left ventricular ejection fraction.  Per hospitalist documentation, she is not showing signs of heart failure or cardiovascular compromise.  If it was within the patient's goals of care, outpatient cardiovascular follow-up can be arranged after she is recovered from  COVID-19.  We can arrange a limited echocardiogram at that time if necessary.  Thank you for this consultation.  Cardiology will sign off at this time, however do not hesitate to contact us if we can be of further assistance.  For questions or updates, please contact [place] Please consult [email or web address] for contact info under     Signed, Elouise Munroe, MD  01/11/2019 3:59 PM

## 2019-01-11 NOTE — TOC Progression Note (Signed)
Transition of Care [facility]) - Progression Note    Patient Details  Name: Barbara Gill MRN: [id number] Date of Birth: 05/13/1955  Transition of Care [facility]) CM/SW [place], LCSW Phone Number: 01/11/2019, 2:40 PM  Clinical Narrative:    Ronney Lion reviewing patient for bed availability.    Expected Discharge Plan: Assisted Living Barriers to Discharge: Continued Medical Work up  Expected Discharge Plan and Services Expected Discharge Plan: Assisted Living In-house Referral: Clinical Social Work Discharge Planning Services: NA   Living arrangements for the past 2 months: [place]                 DME Arranged: N/A                     Social Determinants of Health (SDOH) Interventions    Readmission Risk Interventions No flowsheet data found.

## 2019-01-11 NOTE — Progress Notes (Signed)
Hospitalist daily note   Barbara Gill [id number] DOB: 01/05/1956 DOA: 01/01/2019  PCP: Marton Redwood, MD   Narrative:  7 wf resident at [facility] HTN, ADD, bipolar, OSA/CPAP, Alzheimer's disease admitted from nursing home 01/01/2019 less responsive-less able to ambulate get up and walk Hypotensive on admission systolic 70 Covid + found to have nondisplaced subcapital left femoral neck fracture-developed hypoxia 12/11 started remdesivir-also found to have bradycardia cardiology consulted  Data Reviewed:  No labs today Assessment & Plan:  Metabolic encephalopathy Etiology unclear-CT/MRI/TSH/B12/ammonia all negative?  Secondary to UTI Coronavirus 19 pneumonia 12/11 Resolved acute respiratory failure completed 5 days of remdesivir-Decadron 6 mg changed to oral dosing Bradycardia TSH normal, echocardiogram pending-Aricept 10 held as this can cause bradycardia-review the same off the aricept Presumed uncomplicated UTI E. coli Given fosfomycin on 12/8 Subcapital left hip fracture ruled out MRI done on hip was negative Presenile dementia unclear cause Continue Depakote 250 twice daily, continue Lexapro 10 Lovenox, inpatient, await cardiology input -skilled discharge in 24-48 hr  Subjective: Awake  Cannot orient  nad not on oxygen  Consultants:   Cardiology Procedures:   Echo pending Antimicrobials:   None currently  Objective: Vitals:   01/10/19 2047 01/10/19 2054 01/11/19 0423 01/11/19 0800  BP:    138/67  Pulse:    (!) 52  Resp:    15  Temp: 98.1 F (36.7 C) 98.1 F (36.7 C) 98.7 F (37.1 C) 98.3 F (36.8 C)  TempSrc:      SpO2:    90%  Weight:      Height:        Intake/Output Summary (Last 24 hours) at 01/11/2019 1219 Last data filed at 01/11/2019 0354 Gross per 24 hour  Intake 180 ml  Output --  Net 180 ml   Filed Weights   01/02/19 2209 01/05/19 0424  Weight: 81.1 kg 81.2 kg    Examination: eomi ncat spaced out appearanc ein nad Ct ab no  added sound Tele shows brady 45-50 sinus no pauses abd soft nt nd no rebound no guard Neuro intact  No le edema  Scheduled Meds: . albuterol  2 puff Inhalation Q8H  . chlorhexidine  15 mL Mouth Rinse BID  . dexamethasone (DECADRON) injection  6 mg Intravenous Q24H  . divalproex  250 mg Oral Q12H  . donepezil  10 mg Oral QHS  . enoxaparin (LOVENOX) injection  40 mg Subcutaneous Daily  . escitalopram  10 mg Oral Daily  . feeding supplement (ENSURE ENLIVE)  237 mL Oral TID BM  . ipratropium  2 puff Inhalation Q8H  . loratadine  10 mg Oral Daily  . mouth rinse  15 mL Mouth Rinse q12n4p  . memantine  28 mg Oral Daily  . multivitamin with minerals  1 tablet Oral Daily  . saccharomyces boulardii  250 mg Oral Daily   Continuous Infusions:   LOS: 9 days   Time spent: [place], MD [facility]

## 2019-01-12 LAB — BASIC METABOLIC PANEL
Anion gap: 12 (ref 5–15)
BUN: 21 mg/dL (ref 8–23)
CO2: 22 mmol/L (ref 22–32)
Calcium: 8.7 mg/dL — ABNORMAL LOW (ref 8.9–10.3)
Chloride: 103 mmol/L (ref 98–111)
Creatinine, Ser: 0.64 mg/dL (ref 0.44–1.00)
GFR calc Af Amer: >60 mL/min (ref >60)
GFR calc non Af Amer: >60 mL/min (ref >60)
Glucose, Bld: 112 mg/dL — ABNORMAL HIGH (ref 70–99)
Potassium: 4.8 mmol/L (ref 3.5–5.1)
Sodium: 137 mmol/L (ref 135–145)

## 2019-01-12 LAB — CBC WITH DIFFERENTIAL/PLATELET
Abs Immature Granulocytes: 0.12 10*3/uL — ABNORMAL HIGH (ref 0.00–0.07)
Basophils Absolute: 0 10*3/uL (ref 0.0–0.1)
Basophils Relative: 0 %
Eosinophils Absolute: 0 10*3/uL (ref 0.0–0.5)
Eosinophils Relative: 0 %
HCT: 45.2 % (ref 36.0–46.0)
Hemoglobin: 14.8 g/dL (ref 12.0–15.0)
Immature Granulocytes: 2 %
Lymphocytes Relative: 21 %
Lymphs Abs: 1.7 10*3/uL (ref 0.7–4.0)
MCH: 29.1 pg (ref 26.0–34.0)
MCHC: 32.7 g/dL (ref 30.0–36.0)
MCV: 89 fL (ref 80.0–100.0)
Monocytes Absolute: 0.9 10*3/uL (ref 0.1–1.0)
Monocytes Relative: 11 %
Neutro Abs: 5.2 10*3/uL (ref 1.7–7.7)
Neutrophils Relative %: 66 %
Platelets: 281 10*3/uL (ref 150–400)
RBC: 5.08 MIL/uL (ref 3.87–5.11)
RDW: 12.4 % (ref 11.5–15.5)
WBC: 7.9 10*3/uL (ref 4.0–10.5)
nRBC: 0 % (ref 0.0–0.2)

## 2019-01-12 MED ORDER — DIVALPROEX SODIUM ER 250 MG PO TB24: 250.0000 mg | ORAL_TABLET | Freq: Two times a day (BID) | ORAL | 0 refills | Status: DC

## 2019-01-12 MED ORDER — MEMANTINE HCL ER 28 MG PO CP24: 28.0000 mg | ORAL_CAPSULE | Freq: Every day | ORAL | 0 refills | Status: AC

## 2019-01-12 MED ORDER — ESCITALOPRAM OXALATE 10 MG PO TABS: 10.0000 mg | ORAL_TABLET | Freq: Every day | ORAL | 0 refills | Status: AC

## 2019-01-12 MED ORDER — DEXAMETHASONE 6 MG PO TABS: 6.0000 mg | ORAL_TABLET | Freq: Every day | ORAL | 0 refills | Status: AC

## 2019-01-12 NOTE — Discharge Summary (Signed)
Physician Discharge Summary  Barbara Gill [id number] DOB: [birth year] DOA: 01/01/2019  PCP: Marton Redwood, MD  Admit date: 01/01/2019 Discharge date: 01/12/2019  Time spent: 30 minutes  Recommendations for Outpatient Follow-up:  1. Needs to complete Decadron as OP--notice have discontinued her Aricept because of bradycardia see below 2. Get screening labs in about 1 week 3. Maintain isolation at least for 14 days total ending on 12/20  Discharge Diagnoses:  Principal Problem:   Acute encephalopathy Active Problems:   COVID-19 virus infection   Hypotension   Hip tenderness, left   Sinus bradycardia   Discharge Condition: Improved but overall guarded  Diet recommendation: Regular  Filed Weights   01/02/19 2209 01/05/19 0424  Weight: 81.1 kg 81.2 kg    History of present illness:  26 wf resident at [facility] HTN, ADD, bipolar, OSA/CPAP, Alzheimer's disease admitted from nursing home 01/01/2019 less responsive-less able to ambulate get up and walk Hypotensive on admission systolic 70 Covid + found to have nondisplaced subcapital left femoral neck fracture-developed hypoxia 12/11 started remdesivir-also found to have bradycardia cardiology consulted  Hospital Course:  Metabolic encephalopathy Etiology unclear-CT/MRI/TSH/B12/ammonia all negative?  Secondary to UTI Coronavirus 19 pneumonia 12/11 Resolved acute respiratory failure completed 5 days of remdesivir-Decadron 6 mg changed to oral dosing which will complete on 12/19 Bradycardia TSH normal, echocardiogram was not performed -Aricept 10 held as this can cause bradycardia-review the same off the aricept Cardiology telephone consulted on patient did not have any further recommendations and signed off this can be worked up as an outpatient if there is a further needs Presumed uncomplicated UTI E. coli Given fosfomycin on 12/8--asymptomatic Subcapital left hip fracture ruled out MRI done on hip was  negative Presenile dementia unclear cause Continue Depakote 250 twice daily, continue Lexapro 10--given Rx on d/c    Discharge Exam: Vitals:   01/11/19 2057 01/12/19 0339  BP: (!) 148/60 126/78  Pulse: (!) 58 (!) 59  Resp: 13 15  Temp: 98.2 F (36.8 C) 98.4 F (36.9 C)  SpO2: 93% 94%    General: awake non coherne tin na dno focal deficit Cardiovascular: s1 s 2no m/r/g Respiratory: clear  No le edema no focal deficit but cannot orient Power grossly 5/5  Discharge Instructions   Discharge Instructions    Diet - low sodium heart healthy   Complete by: As directed    Increase activity slowly   Complete by: As directed      Allergies as of 01/12/2019      Reactions   Seasonal Ic [cholestatin]    Runny nose, itchy eyes   Poison Ivy Extract [poison Ivy Extract] Rash      Medication List    STOP taking these medications   cephALEXin 500 MG capsule Commonly known as: KEFLEX   donepezil 10 MG tablet Commonly known as: ARICEPT     TAKE these medications   cetirizine 10 MG tablet Commonly known as: ZYRTEC Take 10 mg by mouth daily.   dexamethasone 6 MG tablet Commonly known as: DECADRON Take 1 tablet (6 mg total) by mouth daily for 2 days. Start taking on: January 13, 2019   divalproex 250 MG 24 hr tablet Commonly known as: DEPAKOTE ER Take 1 tablet (250 mg total) by mouth 2 (two) times daily.   escitalopram 10 MG tablet Commonly known as: Lexapro Take 1 tablet (10 mg total) by mouth daily.   memantine 28 MG Cp24 24 hr capsule Commonly known as: Namenda XR Take 1 capsule (28  mg total) by mouth daily.   saccharomyces boulardii 250 MG capsule Commonly known as: FLORASTOR Take 250 mg by mouth daily.      Allergies  Allergen Reactions  . Seasonal Ic [Cholestatin]     Runny nose, itchy eyes  . Poison Ivy Extract [Poison Ivy Extract] Rash      The results of significant diagnostics from this hospitalization (including imaging, microbiology,  ancillary and laboratory) are listed below for reference.    Significant Diagnostic Studies: DG Chest 1 View  Result Date: 01/01/2019 CLINICAL DATA:  Altered mental status, nystagmus, history hypertension, Alzheimer's EXAM: CHEST  1 VIEW COMPARISON:  Portable exam 1757 hours without priors for comparison FINDINGS: Normal heart size, mediastinal contours, and pulmonary vascularity. Lungs clear. No pleural effusion or pneumothorax. Bones unremarkable. IMPRESSION: No acute abnormalities. Electronically Signed   By: Lavonia Dana M.D.   On: 01/01/2019 18:33   CT Head Wo Contrast  Result Date: 01/01/2019 CLINICAL DATA:  New onset altered mental status, unable to ambulate, history stroke, hypertension, Alzheimer's EXAM: CT HEAD WITHOUT CONTRAST TECHNIQUE: Contiguous axial images were obtained from the base of the skull through the vertex without intravenous contrast. Sagittal and coronal MPR images reconstructed from axial data set. COMPARISON:  None FINDINGS: Brain: Generalized atrophy. Mild ex vacuo dilatation of the ventricular system. No midline shift or mass effect. Minimal small vessel chronic ischemic changes of deep cerebral white matter. No intracranial hemorrhage, mass lesion, or evidence of acute infarction. No extra-axial fluid collections. Vascular: No hyperdense vessels. Minimal atherosclerotic calcifications of internal carotid arteries at skull base Skull: Intact Sinuses/Orbits: Clear Other: N/A IMPRESSION: Atrophy with minimal small vessel chronic ischemic changes of deep cerebral white matter. No acute intracranial abnormalities. Electronically Signed   By: Lavonia Dana M.D.   On: 01/01/2019 19:52   MR BRAIN WO CONTRAST  Result Date: 01/02/2019 CLINICAL DATA:  63 year old female with new onset altered mental status. Encephalopathy. Alzheimer's disease. EXAM: MRI HEAD WITHOUT CONTRAST TECHNIQUE: Multiplanar, multiecho pulse sequences of the brain and surrounding structures were obtained without  intravenous contrast. COMPARISON:  Head CT yesterday. FINDINGS: Brain: No restricted diffusion to suggest acute infarction. No midline shift, mass effect, evidence of mass lesion, extra-axial collection or acute intracranial hemorrhage. Cervicomedullary junction within normal limits. Partially empty sella. However, there is severe generalized cerebral hemisphere atrophy which appears symmetric (series 10, image 16). Associated ventricular enlargement appears to be ex vacuo. No superimposed ischemic encephalomalacia or chronic cerebral blood products identified. Signal in the deep gray nuclei, brainstem and cerebellum remains normal. Brainstem and cerebellar volume is also better preserved. Vascular: Major intracranial vascular flow voids are preserved. Skull and upper cervical spine: Negative visible cervical spine. Visualized bone marrow signal is within normal limits. Sinuses/Orbits: Negative orbits. Mild to moderate bilateral paranasal sinus mucosal thickening. No sinus fluid level. Other: Mastoids are clear. Visible internal auditory structures appear normal. Scalp and face soft tissues appear negative. IMPRESSION: 1. No acute intracranial abnormality. 2. Severe generalized cerebral atrophy probably related to advanced Alzheimer's disease. 3. Mild paranasal sinus inflammation. Electronically Signed   By: Genevie Ann M.D.   On: 01/02/2019 09:52   CT ABDOMEN PELVIS W CONTRAST  Result Date: 01/01/2019 CLINICAL DATA:  Encephalopathy with abdominal distension and difficulty ambulating. EXAM: CT ABDOMEN AND PELVIS WITH CONTRAST TECHNIQUE: Multidetector CT imaging of the abdomen and pelvis was performed using the standard protocol following bolus administration of intravenous contrast. CONTRAST:  167mL OMNIPAQUE IOHEXOL 300 MG/ML  SOLN COMPARISON:  None. FINDINGS: LOWER  CHEST: Small pleural effusions HEPATOBILIARY: Normal hepatic contours. No intra- or extrahepatic biliary dilatation. Normal gallbladder. PANCREAS:  Normal pancreas. No ductal dilatation or peripancreatic fluid collection. SPLEEN: Normal. ADRENALS/URINARY TRACT: --Adrenal glands: Normal. --Kidneys and ureters: No hydronephrosis, nephroureterolithiasis or solid renal mass. --Urinary bladder: Normal for degree of distention STOMACH/BOWEL: --Stomach/Duodenum: No hiatal hernia. Normal duodenal course and caliber. --Small bowel: No dilatation or inflammation. --Colon: No focal abnormality. --Appendix: Not visualized. No right lower quadrant inflammation or free fluid. VASCULAR/LYMPHATIC: There is calcific atherosclerosis of the abdominal aorta. No abdominal or pelvic lymphadenopathy. REPRODUCTIVE: Normal uterus. No adnexal mass. MUSCULOSKELETAL. Grade 1 facetogenic anterolisthesis at L4-5. OTHER: None. IMPRESSION: 1. No acute abdominal or pelvic abnormality. 2. Small pleural effusions. 3. Aortic Atherosclerosis (ICD10-I70.0). Electronically Signed   By: Ulyses Jarred M.D.   On: 01/01/2019 19:59   MR HIP LEFT WO CONTRAST  Result Date: 01/02/2019 CLINICAL DATA:  Left hip tenderness on physical examination. Difficulty walking. Suspected hip fracture. EXAM: MR OF THE LEFT HIP WITHOUT CONTRAST TECHNIQUE: Multiplanar, multisequence MR imaging was performed. No intravenous contrast was administered. COMPARISON:  Radiographs and pelvic CT 01/01/2019. FINDINGS: Despite efforts by the technologist and patient, mild motion artifact is present on today's exam and could not be eliminated. This reduces exam sensitivity and specificity. Bones: There is no evidence of acute fracture, dislocation or avascular necrosis. The visualized bony pelvis appears normal. The visualized sacroiliac joints and symphysis pubis appear normal. Articular cartilage and labrum Articular cartilage: Mild symmetric degenerative changes of both hips. Labrum: There is no gross labral tear or paralabral abnormality. Joint or bursal effusion Joint effusion: No significant hip joint effusion. Bursae: No  focal periarticular fluid collection. Muscles and tendons Muscles and tendons: The visualized gluteus, hamstring and iliopsoas tendons appear normal. The piriformis muscles appear symmetric. Other findings Miscellaneous: A small amount of free pelvic fluid is within physiologic limits. The visualized internal pelvic contents otherwise appear normal. IMPRESSION: 1. No acute findings or explanation for the patient's symptoms. No evidence of left femoral neck fracture. 2. Mild symmetric degenerative changes of both hips. Electronically Signed   By: Richardean Sale M.D.   On: 01/02/2019 10:33   DG CHEST PORT 1 VIEW  Result Date: 01/06/2019 CLINICAL DATA:  Hypoxia.  Altered mental status. EXAM: PORTABLE CHEST 1 VIEW COMPARISON:  January 03, 2019 FINDINGS: Developing opacity in the left lung, diffusely. The opacity is more focal in the region of the lingula. The cardiomediastinal silhouette is stable. Mild opacity seen in the right base. No other acute abnormalities. IMPRESSION: Developing opacities bilaterally, diffusely on the left and more focal in the right base. The findings may represent developing multifocal pneumonia in the appropriate clinical setting. Recommend clinical correlation. Electronically Signed   By: Dorise Bullion III M.D   On: 01/06/2019 09:16   DG CHEST PORT 1 VIEW  Result Date: 01/03/2019 CLINICAL DATA:  Altered mental status, fever, COVID-19 positive EXAM: PORTABLE CHEST 1 VIEW COMPARISON:  Radiograph 01/01/2019 FINDINGS: No consolidation, features of edema, pneumothorax, or effusion. Pulmonary vascularity is normally distributed. The cardiomediastinal contours are unremarkable. No acute osseous or soft tissue abnormality. IMPRESSION: No acute cardiopulmonary abnormality. Electronically Signed   By: Lovena Le M.D.   On: 01/03/2019 21:40   EEG adult  Result Date: 01/02/2019 Lora Havens, MD     01/02/2019 12:48 PM Patient Name: EMELINDA MARBLE MRN: [id number] Epilepsy  Attending: Lora Havens Referring Physician/Provider: Dr. Shela Leff Date: 01/02/2019 Duration: 25.52 minutes Patient history: 62 year old female with  altered mental status, Covid positive.  EEG to evaluate for seizures. Level of alertness: Awake AEDs during EEG study: None Technical aspects: This EEG study was done with scalp electrodes positioned according to the 10-20 International system of electrode placement. Electrical activity was acquired at a sampling rate of 500Hz  and reviewed with a high frequency filter of 70Hz  and a low frequency filter of 1Hz . EEG data were recorded continuously and digitally stored. Description: EEG showed continuous generalized rhythmic 2 to 5 Hz theta-delta slowing.  No clear posterior dominant rhythm was seen.  Hyperventilation photic solution were not performed. Abnormality - Continuous rhythmic slow, generalized IMPRESSION: This study is suggestive of moderate diffuse encephalopathy, nonspecific etiology. No seizures or epileptiform discharges were seen throughout the recording.   DG Hip Unilat W or Wo Pelvis 2-3 Views Left  Result Date: 01/01/2019 CLINICAL DATA:  Fall and left hip pain EXAM: DG HIP (WITH OR WITHOUT PELVIS) 2-3V LEFT COMPARISON:  None. FINDINGS: Suspected cortical irregularity in the subcapital region of the left femoral neck seen on the AP view of the pelvis is indeterminate for a nondisplaced subcapital fracture. This apparent cortical irregularity is not seen on the other views of the hip. There is no evidence of hip dislocation. Degenerative changes are seen in both hips and in the lumbar spine. IMPRESSION: 1. Findings indeterminate for a nondisplaced subcapital fracture of the left femoral neck. If there is clinical concern, MR or CT of the pelvis and left hip could be performed. Electronically Signed   By: Zerita Boers M.D.   On: 01/01/2019 18:35    Microbiology: Recent Results (from the past 240 hour(s))  MRSA PCR Screening     Status:  None   Collection Time: 01/02/19 10:27 PM   Specimen: Nasal Mucosa; Nasopharyngeal  Result Value Ref Range Status   MRSA by PCR NEGATIVE NEGATIVE Final    Comment:        The GeneXpert MRSA Assay (FDA approved for NASAL specimens only), is one component of a comprehensive MRSA colonization surveillance program. It is not intended to diagnose MRSA infection nor to guide or monitor treatment for MRSA infections. Performed at [facility], [place] [address], [place], [place] [address]   Culture, blood (routine x 2)     Status: None   Collection Time: 01/03/19  9:10 PM   Specimen: BLOOD  Result Value Ref Range Status   Specimen Description BLOOD RIGHT HAND  Final   Special Requests   Final    BOTTLES DRAWN AEROBIC AND ANAEROBIC Blood Culture adequate volume   Culture   Final    NO GROWTH 5 DAYS Performed at [facility], [place] [address] [place], [place] [address]    Report Status 01/09/2019 FINAL  Final  Culture, blood (routine x 2)     Status: None   Collection Time: 01/03/19  9:18 PM   Specimen: BLOOD  Result Value Ref Range Status   Specimen Description BLOOD RIGHT HAND  Final   Special Requests   Final    BOTTLES DRAWN AEROBIC AND ANAEROBIC Blood Culture results may not be optimal due to an inadequate volume of blood received in culture bottles   Culture   Final    NO GROWTH 5 DAYS Performed at [facility], [place] [address] [place], [place] [address]    Report Status 01/09/2019 FINAL  Final     Labs: Basic Metabolic Panel: Recent Labs  Lab 01/06/19 0500 01/06/19 1132 01/07/19 0459 01/08/19  0500 01/09/19 0500 01/12/19 0500  NA 140  --  141 142 142 137  K 3.4*  --  3.8 3.7 3.5 4.8  CL 102  --  107 106 105 103  CO2 27  --  23 27 27 22   GLUCOSE 91  --  96 99 89 112*  BUN 9  --  13 17 17 21   CREATININE 0.70  --  0.57 0.62 0.65 0.64  CALCIUM 8.5*  --  8.6* 8.7* 8.6* 8.7*  MG  --  1.9  --   --   --   --    Liver Function Tests: Recent Labs  Lab  01/06/19 0500 01/07/19 0459 01/08/19 0500 01/09/19 0500  AST 52* 43* 38 35  ALT 64* 62* 59* 52*  ALKPHOS 41 42 41 39  BILITOT 0.9 0.7 1.0 0.7  PROT 6.0* 6.0* 5.8* 5.7*  ALBUMIN 3.1* 3.0* 2.9* 2.9*   No results for input(s): LIPASE, AMYLASE in the last 168 hours. No results for input(s): AMMONIA in the last 168 hours. CBC: Recent Labs  Lab 01/06/19 0500 01/07/19 0459 01/08/19 0500 01/09/19 0500 01/12/19 0500  WBC 3.8* 2.5* 4.4 4.4 7.9  NEUTROABS 2.2 1.4* 2.6 2.1 5.2  HGB 13.9 12.8 13.3 13.4 14.8  HCT 41.2 39.5 39.1 40.2 45.2  MCV 87.3 89.0 85.6 86.5 89.0  PLT 136* 165 205 239 281   Cardiac Enzymes: No results for input(s): CKTOTAL, CKMB, CKMBINDEX, TROPONINI in the last 168 hours. BNP: BNP (last 3 results) No results for input(s): BNP in the last 8760 hours.  ProBNP (last 3 results) No results for input(s): PROBNP in the last 8760 hours.  CBG: No results for input(s): GLUCAP in the last 168 hours.     Signed:  Nita Sells MD   [facility] 01/12/2019, 11:30 AM

## 2019-01-12 NOTE — TOC Progression Note (Signed)
Transition of Care [facility]) - Progression Note    Patient Details  Name: Barbara Gill MRN: [id number] Date of Birth: [birth year]  Transition of Care [facility]) CM/SW [place], LCSW Phone Number: 01/12/2019, 2:32 PM  Clinical Narrative:    CSW still awaiting insurance approval. No SNF beds available for today. [facility] unable to accept patient due to likely needing long term care. [facility] hoping for bed tomorrow.    Expected Discharge Plan: Assisted Living Barriers to Discharge: Continued Medical Work up  Expected Discharge Plan and Services Expected Discharge Plan: Assisted Living In-house Referral: Clinical Social Work Discharge Planning Services: NA   Living arrangements for the past 2 months: [place] Expected Discharge Date: 01/12/19               DME Arranged: N/A                     Social Determinants of Health (SDOH) Interventions    Readmission Risk Interventions No flowsheet data found.

## 2019-01-12 NOTE — TOC Progression Note (Signed)
Transition of Care [facility]) - Progression Note    Patient Details  Name: Barbara Gill MRN: [id number] Date of Birth: 1955-11-16  Transition of Care [facility]) CM/SW [place], [place] Phone Number: 01/12/2019, 4:43 PM  Clinical Narrative:    [facility] able to accept patient tomorrow. Insurance approval received: [phone]. Ambulance approval: [phone]. CSW updated patient's spouse.    Expected Discharge Plan: Assisted Living Barriers to Discharge: Continued Medical Work up  Expected Discharge Plan and Services Expected Discharge Plan: Assisted Living In-house Referral: Clinical Social Work Discharge Planning Services: NA   Living arrangements for the past 2 months: [place] Expected Discharge Date: 01/12/19               DME Arranged: N/A                     Social Determinants of Health (SDOH) Interventions    Readmission Risk Interventions No flowsheet data found.

## 2019-01-12 NOTE — Care Management Important Message (Signed)
Important Message  Patient Details  Name: Barbara Gill MRN: [id number] Date of Birth: 04/13/1955   Medicare Important Message Given:  Yes - Important Message mailed due to current National Emergency  Verbal consent obtained due to current National Emergency  Relationship to patient: Spouse/Significant Other Contact Name: Cindia Fregoso Call Date: 01/12/19  Time: 1626 Phone: [phone] Outcome: Spoke with contact Important Message mailed to: Patient address on file    Delorse Lek 01/12/2019, 4:27 PM

## 2019-01-13 DIAGNOSIS — F0390 Unspecified dementia without behavioral disturbance: Secondary | ICD-10-CM | POA: Diagnosis not present

## 2019-01-13 DIAGNOSIS — D649 Anemia, unspecified: Secondary | ICD-10-CM | POA: Diagnosis not present

## 2019-01-13 DIAGNOSIS — Z743 Need for continuous supervision: Secondary | ICD-10-CM | POA: Diagnosis not present

## 2019-01-13 DIAGNOSIS — Z7401 Bed confinement status: Secondary | ICD-10-CM | POA: Diagnosis not present

## 2019-01-13 DIAGNOSIS — E119 Type 2 diabetes mellitus without complications: Secondary | ICD-10-CM | POA: Diagnosis not present

## 2019-01-13 DIAGNOSIS — M25552 Pain in left hip: Secondary | ICD-10-CM | POA: Diagnosis not present

## 2019-01-13 DIAGNOSIS — U071 COVID-19: Secondary | ICD-10-CM | POA: Diagnosis not present

## 2019-01-13 DIAGNOSIS — N390 Urinary tract infection, site not specified: Secondary | ICD-10-CM | POA: Diagnosis not present

## 2019-01-13 DIAGNOSIS — F319 Bipolar disorder, unspecified: Secondary | ICD-10-CM | POA: Diagnosis not present

## 2019-01-13 DIAGNOSIS — R001 Bradycardia, unspecified: Secondary | ICD-10-CM | POA: Diagnosis not present

## 2019-01-13 DIAGNOSIS — F411 Generalized anxiety disorder: Secondary | ICD-10-CM | POA: Diagnosis not present

## 2019-01-13 DIAGNOSIS — R279 Unspecified lack of coordination: Secondary | ICD-10-CM | POA: Diagnosis not present

## 2019-01-13 DIAGNOSIS — R5381 Other malaise: Secondary | ICD-10-CM | POA: Diagnosis not present

## 2019-01-13 DIAGNOSIS — G9340 Encephalopathy, unspecified: Secondary | ICD-10-CM | POA: Diagnosis not present

## 2019-01-13 DIAGNOSIS — R293 Abnormal posture: Secondary | ICD-10-CM | POA: Diagnosis not present

## 2019-01-13 DIAGNOSIS — E785 Hyperlipidemia, unspecified: Secondary | ICD-10-CM | POA: Diagnosis not present

## 2019-01-13 DIAGNOSIS — R1312 Dysphagia, oropharyngeal phase: Secondary | ICD-10-CM | POA: Diagnosis not present

## 2019-01-13 DIAGNOSIS — I10 Essential (primary) hypertension: Secondary | ICD-10-CM | POA: Diagnosis not present

## 2019-01-13 DIAGNOSIS — Z23 Encounter for immunization: Secondary | ICD-10-CM | POA: Diagnosis not present

## 2019-01-13 DIAGNOSIS — R404 Transient alteration of awareness: Secondary | ICD-10-CM | POA: Diagnosis not present

## 2019-01-13 DIAGNOSIS — F329 Major depressive disorder, single episode, unspecified: Secondary | ICD-10-CM | POA: Diagnosis not present

## 2019-01-13 DIAGNOSIS — I959 Hypotension, unspecified: Secondary | ICD-10-CM | POA: Diagnosis not present

## 2019-01-13 DIAGNOSIS — R41841 Cognitive communication deficit: Secondary | ICD-10-CM | POA: Diagnosis not present

## 2019-01-13 DIAGNOSIS — G309 Alzheimer's disease, unspecified: Secondary | ICD-10-CM | POA: Diagnosis not present

## 2019-01-13 DIAGNOSIS — R569 Unspecified convulsions: Secondary | ICD-10-CM | POA: Diagnosis not present

## 2019-01-13 NOTE — TOC Transition Note (Signed)
Transition of Care [facility]) - CM/SW Discharge Note   Patient Details  Name: Barbara Gill MRN: [id number] Date of Birth: 1955-02-16  Transition of Care [facility]) CM/SW Contact:  Benard Halsted, [place] Phone Number: 01/13/2019, 11:40 AM   Clinical Narrative:    Patient will DC to: [facility] Anticipated DC date: 01/13/19 Family notified: Spouse Transport by: Corey Harold   Per MD patient ready for DC to [facility]. RN, patient, patient's family, and facility notified of DC. Discharge Summary and FL2 sent to facility. RN to call report prior to discharge [phone], ask for [place]). DC packet on chart. Ambulance transport requested for patient.   CSW will sign off for now as social work intervention is no longer needed. Please consult [place] again if new needs arise.  Cedric Fishman, LCSW Clinical Social Worker [phone]    Final next level of care: Skilled Nursing Facility Barriers to Discharge: No Barriers Identified   Patient Goals and CMS Choice Patient states their goals for this hospitalization and ongoing recovery are:: Return to ALF CMS Medicare.gov Compare Post Acute Care list provided to:: Patient Represenative (must comment)(Spouse) Choice offered to / list presented to : Spouse  Discharge Placement   Existing PASRR number confirmed : 01/13/19          Patient [facility] bed at: [facility] Patient to be transferred to facility by: [place] Name of family member notified: Spouse, [name] Patient and family notified of of transfer: 01/13/19  Discharge Plan and Services In-house Referral: Clinical Social Work Discharge Planning Services: NA            DME Arranged: N/A                    Social Determinants of Health (SDOH) Interventions     Readmission Risk Interventions No flowsheet data found.

## 2019-01-13 NOTE — Progress Notes (Signed)
Hospitalist daily note   Barbara Gill [id number] DOB: 1955/09/14 DOA: 01/01/2019  PCP: Marton Redwood, MD   Narrative:  73 wf resident at [facility] HTN, ADD, bipolar, OSA/CPAP, Alzheimer's disease admitted from nursing home 01/01/2019 less responsive-less able to ambulate get up and walk Hypotensive on admission systolic 70 Covid + found to have nondisplaced subcapital left femoral neck fracture-developed hypoxia 12/11 started remdesivir-also found to have bradycardia cardiology consulted  Data Reviewed:  No labs today Assessment & Plan:  Metabolic encephalopathy Etiology unclear-CT/MRI/TSH/B12/ammonia all negative?  Secondary to UTI Coronavirus 19 pneumonia 12/11 Resolved acute respiratory failure completed 5 days of remdesivir-Decadron 6 mg changed to oral dosing complete on 12/20 sats intermiettently drop--RN to recheck Bradycardia TSH normal, echocardiogram pending-Aricept 10 held as this can cause bradycardia-review the same off the aricept--monitors seem to show sinus brady but improved to 50's and stable at this time Presumed uncomplicated UTI E. coli Given fosfomycin on 12/8 Subcapital left hip fracture ruled out MRI done on hip was negative Presenile dementia unclear cause Continue Depakote 250 twice daily, continue Lexapro 10 Lovenox, inpatient, await cardiology input -skilled discharge when bed available  Subjective:  Awake incoherent non verbal some low sat upper 80 on monitor  Consultants:   Cardiology Procedures:   Echo pending Antimicrobials:   None currently  Objective: Vitals:   01/12/19 0339 01/12/19 1316 01/12/19 2131 01/13/19 0525  BP: 126/78 (!) 144/64 134/81 108/71  Pulse: (!) 59 (!) 48  (!) 48  Resp: 15 19 12 16   Temp: 98.4 F (36.9 C) 97.6 F (36.4 C) 98.7 F (37.1 C) 97.6 F (36.4 C)  TempSrc: Axillary Oral Axillary Oral  SpO2: 94% 100%  99%  Weight:      Height:        Intake/Output Summary (Last 24 hours) at 01/13/2019  1040 Last data filed at 01/13/2019 0900 Gross per 24 hour  Intake 480 ml  Output 600 ml  Net -120 ml   Filed Weights   01/02/19 2209 01/05/19 0424  Weight: 81.1 kg 81.2 kg    Examination: eomi ncat confused but  In nad no focal deficit Ct ab no added sound Tele shows brady shows rates predominant 50's abd soft nt nd no rebound no guard Neuro intact  No le edema  Scheduled Meds: . albuterol  2 puff Inhalation Q8H  . chlorhexidine  15 mL Mouth Rinse BID  . dexamethasone  6 mg Oral Daily  . divalproex  250 mg Oral Q12H  . enoxaparin (LOVENOX) injection  40 mg Subcutaneous Daily  . escitalopram  10 mg Oral Daily  . feeding supplement (ENSURE ENLIVE)  237 mL Oral TID BM  . ipratropium  2 puff Inhalation Q8H  . loratadine  10 mg Oral Daily  . mouth rinse  15 mL Mouth Rinse q12n4p  . memantine  28 mg Oral Daily  . multivitamin with minerals  1 tablet Oral Daily  . saccharomyces boulardii  250 mg Oral Daily   Continuous Infusions:   LOS: 11 days   Time spent: [address], MD [facility]

## 2019-01-13 NOTE — Discharge Summary (Signed)
Physician Discharge Summary  Barbara Gill [id number] DOB: 1955/05/26 DOA: 01/01/2019  PCP: Marton Redwood, MD No acute changes from prior assesment--still confused to some extent but stabilized and can d/c to SNF when able Admit date: 01/01/2019 Discharge date: 01/13/2019  Time spent: 30 minutes  Recommendations for Outpatient Follow-up:  1. Needs to complete Decadron as OP--notice have discontinued her Aricept because of bradycardia see below 2. Get screening labs in about 1 week 3. Maintain isolation at least for 14 days total ending on 12/20  Discharge Diagnoses:  Principal Problem:   Acute encephalopathy Active Problems:   COVID-19 virus infection   Hypotension   Hip tenderness, left   Sinus bradycardia   Discharge Condition: Improved but overall guarded  Diet recommendation: Regular  Filed Weights   01/02/19 2209 01/05/19 0424  Weight: 81.1 kg 81.2 kg    History of present illness:  52 wf resident at [facility] HTN, ADD, bipolar, OSA/CPAP, Alzheimer's disease admitted from nursing home 01/01/2019 less responsive-less able to ambulate get up and walk Hypotensive on admission systolic 70 Covid + found to have nondisplaced subcapital left femoral neck fracture-developed hypoxia 12/11 started remdesivir-also found to have bradycardia cardiology consulted  Hospital Course:  Metabolic encephalopathy Etiology unclear-CT/MRI/TSH/B12/ammonia all negative?  Secondary to UTI Coronavirus 19 pneumonia 12/11 Resolved acute respiratory failure completed 5 days of remdesivir-Decadron 6 mg changed to oral dosing which will complete on 12/19 Bradycardia TSH normal, echocardiogram was not performed -Aricept 10 held as this can cause bradycardia-review the same off the aricept Cardiology telephone consulted on patient did not have any further recommendations and signed off this can be worked up as an outpatient if there is a further needs Presumed uncomplicated UTI E.  coli Given fosfomycin on 12/8--asymptomatic Subcapital left hip fracture ruled out MRI done on hip was negative Presenile dementia unclear cause Continue Depakote 250 twice daily, continue Lexapro 10--given Rx on d/c    Discharge Exam: Vitals:   01/12/19 2131 01/13/19 0525  BP: 134/81 108/71  Pulse:  (!) 48  Resp: 12 16  Temp: 98.7 F (37.1 C) 97.6 F (36.4 C)  SpO2:  99%    General: awake non coherne tin na dno focal deficit Cardiovascular: s1 s 2no m/r/g Respiratory: clear  No le edema no focal deficit but cannot orient Power grossly 5/5  Discharge Instructions   Discharge Instructions    Diet - low sodium heart healthy   Complete by: As directed    Increase activity slowly   Complete by: As directed      Allergies as of 01/13/2019      Reactions   Seasonal Ic [cholestatin]    Runny nose, itchy eyes   Poison Ivy Extract [poison Ivy Extract] Rash      Medication List    STOP taking these medications   cephALEXin 500 MG capsule Commonly known as: KEFLEX   donepezil 10 MG tablet Commonly known as: ARICEPT     TAKE these medications   cetirizine 10 MG tablet Commonly known as: ZYRTEC Take 10 mg by mouth daily.   dexamethasone 6 MG tablet Commonly known as: DECADRON Take 1 tablet (6 mg total) by mouth daily for 2 days.   divalproex 250 MG 24 hr tablet Commonly known as: DEPAKOTE ER Take 1 tablet (250 mg total) by mouth 2 (two) times daily.   escitalopram 10 MG tablet Commonly known as: Lexapro Take 1 tablet (10 mg total) by mouth daily.   memantine 28 MG Cp24 24 hr  capsule Commonly known as: Namenda XR Take 1 capsule (28 mg total) by mouth daily.   saccharomyces boulardii 250 MG capsule Commonly known as: FLORASTOR Take 250 mg by mouth daily.      Allergies  Allergen Reactions  . Seasonal Ic [Cholestatin]     Runny nose, itchy eyes  . Poison Ivy Extract [Poison Ivy Extract] Rash   Contact information for after-discharge care     Destination    [facility] Preferred SNF .   Service: Skilled Nursing Contact information: [address] [place] [phone]               The results of significant diagnostics from this hospitalization (including imaging, microbiology, ancillary and laboratory) are listed below for reference.    Significant Diagnostic Studies: DG Chest 1 View  Result Date: 01/01/2019 CLINICAL DATA:  Altered mental status, nystagmus, history hypertension, Alzheimer's EXAM: CHEST  1 VIEW COMPARISON:  Portable exam 1757 hours without priors for comparison FINDINGS: Normal heart size, mediastinal contours, and pulmonary vascularity. Lungs clear. No pleural effusion or pneumothorax. Bones unremarkable. IMPRESSION: No acute abnormalities. Electronically Signed   By: Lavonia Dana M.D.   On: 01/01/2019 18:33   CT Head Wo Contrast  Result Date: 01/01/2019 CLINICAL DATA:  New onset altered mental status, unable to ambulate, history stroke, hypertension, Alzheimer's EXAM: CT HEAD WITHOUT CONTRAST TECHNIQUE: Contiguous axial images were obtained from the base of the skull through the vertex without intravenous contrast. Sagittal and coronal MPR images reconstructed from axial data set. COMPARISON:  None FINDINGS: Brain: Generalized atrophy. Mild ex vacuo dilatation of the ventricular system. No midline shift or mass effect. Minimal small vessel chronic ischemic changes of deep cerebral white matter. No intracranial hemorrhage, mass lesion, or evidence of acute infarction. No extra-axial fluid collections. Vascular: No hyperdense vessels. Minimal atherosclerotic calcifications of internal carotid arteries at skull base Skull: Intact Sinuses/Orbits: Clear Other: N/A IMPRESSION: Atrophy with minimal small vessel chronic ischemic changes of deep cerebral white matter. No acute intracranial abnormalities. Electronically Signed   By: Lavonia Dana M.D.   On: 01/01/2019 19:52   MR BRAIN WO  CONTRAST  Result Date: 01/02/2019 CLINICAL DATA:  63 year old female with new onset altered mental status. Encephalopathy. Alzheimer's disease. EXAM: MRI HEAD WITHOUT CONTRAST TECHNIQUE: Multiplanar, multiecho pulse sequences of the brain and surrounding structures were obtained without intravenous contrast. COMPARISON:  Head CT yesterday. FINDINGS: Brain: No restricted diffusion to suggest acute infarction. No midline shift, mass effect, evidence of mass lesion, extra-axial collection or acute intracranial hemorrhage. Cervicomedullary junction within normal limits. Partially empty sella. However, there is severe generalized cerebral hemisphere atrophy which appears symmetric (series 10, image 16). Associated ventricular enlargement appears to be ex vacuo. No superimposed ischemic encephalomalacia or chronic cerebral blood products identified. Signal in the deep gray nuclei, brainstem and cerebellum remains normal. Brainstem and cerebellar volume is also better preserved. Vascular: Major intracranial vascular flow voids are preserved. Skull and upper cervical spine: Negative visible cervical spine. Visualized bone marrow signal is within normal limits. Sinuses/Orbits: Negative orbits. Mild to moderate bilateral paranasal sinus mucosal thickening. No sinus fluid level. Other: Mastoids are clear. Visible internal auditory structures appear normal. Scalp and face soft tissues appear negative. IMPRESSION: 1. No acute intracranial abnormality. 2. Severe generalized cerebral atrophy probably related to advanced Alzheimer's disease. 3. Mild paranasal sinus inflammation. Electronically Signed   By: Genevie Ann M.D.   On: 01/02/2019 09:52   CT ABDOMEN PELVIS W CONTRAST  Result Date: 01/01/2019 CLINICAL DATA:  Encephalopathy with abdominal distension and difficulty ambulating. EXAM: CT ABDOMEN AND PELVIS WITH CONTRAST TECHNIQUE: Multidetector CT imaging of the abdomen and pelvis was performed using the standard protocol  following bolus administration of intravenous contrast. CONTRAST:  122mL OMNIPAQUE IOHEXOL 300 MG/ML  SOLN COMPARISON:  None. FINDINGS: LOWER CHEST: Small pleural effusions HEPATOBILIARY: Normal hepatic contours. No intra- or extrahepatic biliary dilatation. Normal gallbladder. PANCREAS: Normal pancreas. No ductal dilatation or peripancreatic fluid collection. SPLEEN: Normal. ADRENALS/URINARY TRACT: --Adrenal glands: Normal. --Kidneys and ureters: No hydronephrosis, nephroureterolithiasis or solid renal mass. --Urinary bladder: Normal for degree of distention STOMACH/BOWEL: --Stomach/Duodenum: No hiatal hernia. Normal duodenal course and caliber. --Small bowel: No dilatation or inflammation. --Colon: No focal abnormality. --Appendix: Not visualized. No right lower quadrant inflammation or free fluid. VASCULAR/LYMPHATIC: There is calcific atherosclerosis of the abdominal aorta. No abdominal or pelvic lymphadenopathy. REPRODUCTIVE: Normal uterus. No adnexal mass. MUSCULOSKELETAL. Grade 1 facetogenic anterolisthesis at L4-5. OTHER: None. IMPRESSION: 1. No acute abdominal or pelvic abnormality. 2. Small pleural effusions. 3. Aortic Atherosclerosis (ICD10-I70.0). Electronically Signed   By: Ulyses Jarred M.D.   On: 01/01/2019 19:59   MR HIP LEFT WO CONTRAST  Result Date: 01/02/2019 CLINICAL DATA:  Left hip tenderness on physical examination. Difficulty walking. Suspected hip fracture. EXAM: MR OF THE LEFT HIP WITHOUT CONTRAST TECHNIQUE: Multiplanar, multisequence MR imaging was performed. No intravenous contrast was administered. COMPARISON:  Radiographs and pelvic CT 01/01/2019. FINDINGS: Despite efforts by the technologist and patient, mild motion artifact is present on today's exam and could not be eliminated. This reduces exam sensitivity and specificity. Bones: There is no evidence of acute fracture, dislocation or avascular necrosis. The visualized bony pelvis appears normal. The visualized sacroiliac joints  and symphysis pubis appear normal. Articular cartilage and labrum Articular cartilage: Mild symmetric degenerative changes of both hips. Labrum: There is no gross labral tear or paralabral abnormality. Joint or bursal effusion Joint effusion: No significant hip joint effusion. Bursae: No focal periarticular fluid collection. Muscles and tendons Muscles and tendons: The visualized gluteus, hamstring and iliopsoas tendons appear normal. The piriformis muscles appear symmetric. Other findings Miscellaneous: A small amount of free pelvic fluid is within physiologic limits. The visualized internal pelvic contents otherwise appear normal. IMPRESSION: 1. No acute findings or explanation for the patient's symptoms. No evidence of left femoral neck fracture. 2. Mild symmetric degenerative changes of both hips. Electronically Signed   By: Richardean Sale M.D.   On: 01/02/2019 10:33   DG CHEST PORT 1 VIEW  Result Date: 01/06/2019 CLINICAL DATA:  Hypoxia.  Altered mental status. EXAM: PORTABLE CHEST 1 VIEW COMPARISON:  January 03, 2019 FINDINGS: Developing opacity in the left lung, diffusely. The opacity is more focal in the region of the lingula. The cardiomediastinal silhouette is stable. Mild opacity seen in the right base. No other acute abnormalities. IMPRESSION: Developing opacities bilaterally, diffusely on the left and more focal in the right base. The findings may represent developing multifocal pneumonia in the appropriate clinical setting. Recommend clinical correlation. Electronically Signed   By: Dorise Bullion III M.D   On: 01/06/2019 09:16   DG CHEST PORT 1 VIEW  Result Date: 01/03/2019 CLINICAL DATA:  Altered mental status, fever, COVID-19 positive EXAM: PORTABLE CHEST 1 VIEW COMPARISON:  Radiograph 01/01/2019 FINDINGS: No consolidation, features of edema, pneumothorax, or effusion. Pulmonary vascularity is normally distributed. The cardiomediastinal contours are unremarkable. No acute osseous or soft  tissue abnormality. IMPRESSION: No acute cardiopulmonary abnormality. Electronically Signed   By:  Lovena Le M.D.   On: 01/03/2019 21:40   EEG adult  Result Date: 01/02/2019 Lora Havens, MD     01/02/2019 12:48 PM Patient Name: Barbara Gill MRN: [id number] Epilepsy Attending: Lora Havens Referring Physician/Provider: Dr. Shela Leff Date: 01/02/2019 Duration: 25.52 minutes Patient history: 63 year old female with altered mental status, Covid positive.  EEG to evaluate for seizures. Level of alertness: Awake AEDs during EEG study: None Technical aspects: This EEG study was done with scalp electrodes positioned according to the 10-20 International system of electrode placement. Electrical activity was acquired at a sampling rate of 500Hz  and reviewed with a high frequency filter of 70Hz  and a low frequency filter of 1Hz . EEG data were recorded continuously and digitally stored. Description: EEG showed continuous generalized rhythmic 2 to 5 Hz theta-delta slowing.  No clear posterior dominant rhythm was seen.  Hyperventilation photic solution were not performed. Abnormality - Continuous rhythmic slow, generalized IMPRESSION: This study is suggestive of moderate diffuse encephalopathy, nonspecific etiology. No seizures or epileptiform discharges were seen throughout the recording.   DG Hip Unilat W or Wo Pelvis 2-3 Views Left  Result Date: 01/01/2019 CLINICAL DATA:  Fall and left hip pain EXAM: DG HIP (WITH OR WITHOUT PELVIS) 2-3V LEFT COMPARISON:  None. FINDINGS: Suspected cortical irregularity in the subcapital region of the left femoral neck seen on the AP view of the pelvis is indeterminate for a nondisplaced subcapital fracture. This apparent cortical irregularity is not seen on the other views of the hip. There is no evidence of hip dislocation. Degenerative changes are seen in both hips and in the lumbar spine. IMPRESSION: 1. Findings indeterminate for a nondisplaced subcapital  fracture of the left femoral neck. If there is clinical concern, MR or CT of the pelvis and left hip could be performed. Electronically Signed   By: Zerita Boers M.D.   On: 01/01/2019 18:35    Microbiology: Recent Results (from the past 240 hour(s))  Culture, blood (routine x 2)     Status: None   Collection Time: 01/03/19  9:10 PM   Specimen: BLOOD  Result Value Ref Range Status   Specimen Description BLOOD RIGHT HAND  Final   Special Requests   Final    BOTTLES DRAWN AEROBIC AND ANAEROBIC Blood Culture adequate volume   Culture   Final    NO GROWTH 5 DAYS Performed at [facility], [address], [place], [place] [address]    Report Status 01/09/2019 FINAL  Final  Culture, blood (routine x 2)     Status: None   Collection Time: 01/03/19  9:18 PM   Specimen: BLOOD  Result Value Ref Range Status   Specimen Description BLOOD RIGHT HAND  Final   Special Requests   Final    BOTTLES DRAWN AEROBIC AND ANAEROBIC Blood Culture results may not be optimal due to an inadequate volume of blood received in culture bottles   Culture   Final    NO GROWTH 5 DAYS Performed at [facility], [place] [address], [place], [place] [address]    Report Status 01/09/2019 FINAL  Final     Labs: Basic Metabolic Panel: Recent Labs  Lab 01/06/19 1132 01/07/19 0459 01/08/19 0500 01/09/19 0500 01/12/19 0500  NA  --  141 142 142 137  K  --  3.8 3.7 3.5 4.8  CL  --  107 106 105 103  CO2  --  23 27 27 22   GLUCOSE  --  96 99 89 112*  BUN  --  13 17 17 21   CREATININE  --  0.57 0.62 0.65 0.64  CALCIUM  --  8.6* 8.7* 8.6* 8.7*  MG 1.9  --   --   --   --    Liver Function Tests: Recent Labs  Lab 01/07/19 0459 01/08/19 0500 01/09/19 0500  AST 43* 38 35  ALT 62* 59* 52*  ALKPHOS 42 41 39  BILITOT 0.7 1.0 0.7  PROT 6.0* 5.8* 5.7*  ALBUMIN 3.0* 2.9* 2.9*   No results for input(s): LIPASE, AMYLASE in the last 168 hours. No results for input(s): AMMONIA in the last 168  hours. CBC: Recent Labs  Lab 01/07/19 0459 01/08/19 0500 01/09/19 0500 01/12/19 0500  WBC 2.5* 4.4 4.4 7.9  NEUTROABS 1.4* 2.6 2.1 5.2  HGB 12.8 13.3 13.4 14.8  HCT 39.5 39.1 40.2 45.2  MCV 89.0 85.6 86.5 89.0  PLT 165 205 239 281   Cardiac Enzymes: No results for input(s): CKTOTAL, CKMB, CKMBINDEX, TROPONINI in the last 168 hours. BNP: BNP (last 3 results) No results for input(s): BNP in the last 8760 hours.  ProBNP (last 3 results) No results for input(s): PROBNP in the last 8760 hours.  CBG: No results for input(s): GLUCAP in the last 168 hours.     Signed:  Nita Sells MD   [facility] 01/13/2019, 11:21 AM

## 2019-01-13 NOTE — Progress Notes (Addendum)
Device related pressure injury found after removing pt's IV on L hand. 2x2cm (HxW). Wound cleansed with saline and foam applied.

## 2019-01-13 NOTE — Progress Notes (Signed)
Report given to Nikki Dom RN at [facility]

## 2019-01-13 NOTE — Progress Notes (Signed)
Buddy Duty to be D/C'd Skilled nursing [facility]) per MD order.    IV catheter discontinued intact. L Hand noted to have device related pressure injury. Foam applied and noted in chart. An After Visit Summary was printed and given to transport.   Report given to [facility] RN at [facility].  Pt escorted via [facility].   Jeanella Craze 01/13/2019 2:09 PM 2

## 2019-01-14 DIAGNOSIS — U071 COVID-19: Secondary | ICD-10-CM | POA: Diagnosis not present

## 2019-01-14 DIAGNOSIS — F0390 Unspecified dementia without behavioral disturbance: Secondary | ICD-10-CM | POA: Diagnosis not present

## 2019-01-14 DIAGNOSIS — E119 Type 2 diabetes mellitus without complications: Secondary | ICD-10-CM | POA: Diagnosis not present

## 2019-02-02 ENCOUNTER — Emergency Department (HOSPITAL_COMMUNITY): Payer: Medicare (Managed Care)

## 2019-02-02 ENCOUNTER — Emergency Department (EMERGENCY_DEPARTMENT_HOSPITAL)
Admission: EM | Admit: 2019-02-02 | Discharge: 2019-02-03 | Disposition: A | Discharge status: Home / Self Care | Payer: Medicare (Managed Care) | Source: Home / Self Care | Attending: Emergency Medicine | Admitting: Emergency Medicine

## 2019-02-02 ENCOUNTER — Other Ambulatory Visit: Payer: Self-pay

## 2019-02-02 ENCOUNTER — Encounter (HOSPITAL_COMMUNITY): Payer: Self-pay | Admitting: Emergency Medicine

## 2019-02-02 DIAGNOSIS — U071 COVID-19: Secondary | ICD-10-CM

## 2019-02-02 DIAGNOSIS — Z79899 Other long term (current) drug therapy: Secondary | ICD-10-CM | POA: Insufficient documentation

## 2019-02-02 DIAGNOSIS — E119 Type 2 diabetes mellitus without complications: Secondary | ICD-10-CM | POA: Insufficient documentation

## 2019-02-02 DIAGNOSIS — R569 Unspecified convulsions: Secondary | ICD-10-CM | POA: Insufficient documentation

## 2019-02-02 DIAGNOSIS — Z8616 Personal history of COVID-19: Secondary | ICD-10-CM | POA: Insufficient documentation

## 2019-02-02 DIAGNOSIS — F17200 Nicotine dependence, unspecified, uncomplicated: Secondary | ICD-10-CM | POA: Insufficient documentation

## 2019-02-02 DIAGNOSIS — F0280 Dementia in other diseases classified elsewhere without behavioral disturbance: Secondary | ICD-10-CM | POA: Insufficient documentation

## 2019-02-02 DIAGNOSIS — I10 Essential (primary) hypertension: Secondary | ICD-10-CM | POA: Insufficient documentation

## 2019-02-02 DIAGNOSIS — Z20822 Contact with and (suspected) exposure to covid-19: Secondary | ICD-10-CM | POA: Insufficient documentation

## 2019-02-02 DIAGNOSIS — G309 Alzheimer's disease, unspecified: Secondary | ICD-10-CM | POA: Insufficient documentation

## 2019-02-02 LAB — CBC WITH DIFFERENTIAL/PLATELET
Abs Immature Granulocytes: 0.01 10*3/uL (ref 0.00–0.07)
Basophils Absolute: 0 10*3/uL (ref 0.0–0.1)
Basophils Relative: 1 %
Eosinophils Absolute: 0.2 10*3/uL (ref 0.0–0.5)
Eosinophils Relative: 4 %
HCT: 43.2 % (ref 36.0–46.0)
Hemoglobin: 13.6 g/dL (ref 12.0–15.0)
Immature Granulocytes: 0 %
Lymphocytes Relative: 26 %
Lymphs Abs: 1.8 10*3/uL (ref 0.7–4.0)
MCH: 28.9 pg (ref 26.0–34.0)
MCHC: 31.5 g/dL (ref 30.0–36.0)
MCV: 91.9 fL (ref 80.0–100.0)
Monocytes Absolute: 0.6 10*3/uL (ref 0.1–1.0)
Monocytes Relative: 9 %
Neutro Abs: 4.2 10*3/uL (ref 1.7–7.7)
Neutrophils Relative %: 60 %
Platelets: 229 10*3/uL (ref 150–400)
RBC: 4.7 MIL/uL (ref 3.87–5.11)
RDW: 13 % (ref 11.5–15.5)
WBC: 6.9 10*3/uL (ref 4.0–10.5)
nRBC: 0 % (ref 0.0–0.2)

## 2019-02-02 LAB — COMPREHENSIVE METABOLIC PANEL
ALT: 75 U/L — ABNORMAL HIGH (ref 0–44)
AST: 23 U/L (ref 15–41)
Albumin: 3.2 g/dL — ABNORMAL LOW (ref 3.5–5.0)
Alkaline Phosphatase: 56 U/L (ref 38–126)
Anion gap: 9 (ref 5–15)
BUN: 10 mg/dL (ref 8–23)
CO2: 28 mmol/L (ref 22–32)
Calcium: 9.1 mg/dL (ref 8.9–10.3)
Chloride: 107 mmol/L (ref 98–111)
Creatinine, Ser: 0.72 mg/dL (ref 0.44–1.00)
GFR calc Af Amer: >60 mL/min (ref >60)
GFR calc non Af Amer: >60 mL/min (ref >60)
Glucose, Bld: 105 mg/dL — ABNORMAL HIGH (ref 70–99)
Potassium: 3.9 mmol/L (ref 3.5–5.1)
Sodium: 144 mmol/L (ref 135–145)
Total Bilirubin: 0.6 mg/dL (ref 0.3–1.2)
Total Protein: 6.3 g/dL — ABNORMAL LOW (ref 6.5–8.1)

## 2019-02-02 LAB — AMMONIA: Ammonia: 29 umol/L (ref 9–35)

## 2019-02-02 LAB — I-STAT BETA HCG BLOOD, ED (MC, WL, AP ONLY): I-stat hCG, quantitative: <5 m[IU]/mL (ref <5)

## 2019-02-02 LAB — SARS CORONAVIRUS 2 (TAT 6-24 HRS): SARS Coronavirus 2: NEGATIVE

## 2019-02-02 LAB — VALPROIC ACID LEVEL: Valproic Acid Lvl: 22 ug/mL — ABNORMAL LOW (ref 50.0–100.0)

## 2019-02-02 LAB — MAGNESIUM: Magnesium: 2 mg/dL (ref 1.7–2.4)

## 2019-02-02 MED ORDER — VALPROATE SODIUM 500 MG/5ML IV SOLN
750.0000 mg | Freq: Once | INTRAVENOUS | Status: DC
  Filled 2019-02-02: qty 7.5

## 2019-02-02 MED ORDER — VALPROATE SODIUM 500 MG/5ML IV SOLN
1000.0000 mg | Freq: Once | INTRAVENOUS | Status: AC
  Administered 2019-02-02: 1000 mg via INTRAVENOUS
  Filled 2019-02-02: qty 10

## 2019-02-02 MED ORDER — DIVALPROEX SODIUM 500 MG PO DR TAB: 500.0000 mg | DELAYED_RELEASE_TABLET | Freq: Two times a day (BID) | ORAL | 1 refills | Status: AC

## 2019-02-02 MED ORDER — LORAZEPAM 2 MG/ML IJ SOLN
1.0000 mg | Freq: Once | INTRAMUSCULAR | Status: AC
  Administered 2019-02-02: 1 mg via INTRAVENOUS
  Filled 2019-02-02: qty 1

## 2019-02-02 MED ORDER — VALPROATE SODIUM 500 MG/5ML IV SOLN: 750.0000 mg | Freq: Once | INTRAVENOUS | Status: DC

## 2019-02-02 MED ORDER — SODIUM CHLORIDE 0.9 % IV SOLN: INTRAVENOUS | Status: DC

## 2019-02-02 MED ORDER — LEVETIRACETAM 500 MG PO TABS: 500.0000 mg | ORAL_TABLET | Freq: Two times a day (BID) | ORAL | Status: DC

## 2019-02-02 NOTE — Discharge Instructions (Addendum)
Report of seizure activity.  Patient has been very stable here.  Continue current medications.  Recommend follow-up with neurology.  Call and make an appointment.  Return for any new or worse symptoms.  CT scan of the head without any acute findings.  Labs without significant abnormalities.

## 2019-02-02 NOTE — ED Notes (Signed)
Patient transported to CT 

## 2019-02-02 NOTE — ED Notes (Signed)
[name] [phone]- [phone]

## 2019-02-02 NOTE — ED Triage Notes (Signed)
Pt from [facility] unit. Staff witnessed pt shaking, eyes rolling back in her head, and then snoring. EMS reported snoring en route but able to arouse her. No hx of seizures. Pt non-verbal at baseline. VSS

## 2019-02-02 NOTE — ED Notes (Signed)
Please call Jorden Pendergast @ [phone] for update on pt

## 2019-02-02 NOTE — Consult Note (Signed)
Neurology Consultation  Reason for Consult: Seizure Referring Physician: Fredia Sorrow, MD  CC: Seizure  History is obtained from: Chart   HPI: Barbara Gill is a 64 y.o. female with past medical history of Alzheimer's disease, hypercholesterolemia, and hypertension.  Patient resides at [facility] unit.  Today staff witnessed patient shaking and eyes rolled back with snoring and rhonchorous respiration.  Another seizure was witnessed while in the ED.  Neurology was consulted for seizure activity.  As noted patient has significant Alzheimer disease and is nonverbal at baseline.  While at [facility] facility for memory care she was noted to have seizure activity.  EMS was called and reported that she was postictal with snoring respirations and unable to arouse her during transportation.  While in the ED patient was noted to have a second seizure by staff.  This was described as eyes rolled back followed by rhonchorous respirations.  Due to patient being nonverbal at baseline and no history could be obtained from her.  Currently she is alert, showing no signs of seizure, responding to pain only.  Attempts were made to call husband for baseline however, husband did not answer phone.  Looking through chart patient is on Depakote 250 mg twice daily.  Most likely secondary to behavioral issues.  Patient has been seen by Dr. Rexene Alberts of [facility] neurology 3 years ago for sleep apnea.  In addition patient has also recently been seen on 01/02/2019 in the hospital secondary to Covid 19+.  At that time EEG was ordered to evaluate for possible seizure activity causing altered mental status.  EEG did not show any epileptiform activity.  ED course  -CT scan of head -CMP -CBC -Depakote level   Past Medical History:  Diagnosis Date  . Alzheimer's disease ([place])   . High cholesterol   . Hyperlipemia   . Hypertension    Family History  Problem Relation Age of Onset  . Cancer  Father   . Hypothyroidism Father   . Hypertension Mother        borderline  . Diabetes Mother   . Breast cancer Mother    Social History:   reports that she has been smoking. She has never used smokeless tobacco. She reports current alcohol use. She reports that she does not use drugs.  Medications  Current Facility-Administered Medications:  .  [START ON 02/03/2019] 0.9 %  sodium chloride infusion, , Intravenous, Continuous, Fredia Sorrow, MD  Current Outpatient Medications:  .  cetirizine (ZYRTEC) 10 MG tablet, Take 10 mg by mouth daily., Disp: , Rfl:  .  divalproex (DEPAKOTE ER) 250 MG 24 hr tablet, Take 1 tablet (250 mg total) by mouth 2 (two) times daily., Disp: 3 tablet, Rfl: 0 .  escitalopram (LEXAPRO) 10 MG tablet, Take 1 tablet (10 mg total) by mouth daily., Disp: 3 tablet, Rfl: 0 .  memantine (NAMENDA XR) 28 MG CP24 24 hr capsule, Take 1 capsule (28 mg total) by mouth daily., Disp: 3 capsule, Rfl: 0 .  saccharomyces boulardii (FLORASTOR) 250 MG capsule, Take 250 mg by mouth daily., Disp: , Rfl:   ROS:  Unable to obtain due nonverbal at baseline.    Exam: Current vital signs: BP 128/75 (BP Location: Right Arm)   Pulse 64   Temp 97.7 F (36.5 C) (Oral)   Resp 16   Ht 5\' 3"  (1.6 m)   Wt 81.2 kg   SpO2 100%   BMI 31.71 kg/m  Vital signs in last 24  hours: Temp:  [97.7 F (36.5 C)] 97.7 F (36.5 C) (01/07 0936) Pulse Rate:  [64-81] 64 (01/07 1551) Resp:  [16-21] 16 (01/07 1551) BP: (118-135)/(75-87) 128/75 (01/07 1551) SpO2:  [95 %-100 %] 100 % (01/07 1551) Weight:  [81.2 kg] 81.2 kg (01/07 0932)   Constitutional: Appears well-developed and well-nourished.  Psych: Nonverbal at baseline Eyes: No scleral injection HENT: No OP obstrucion Head: Normocephalic.  Cardiovascular: Normal rate and regular rhythm.  Respiratory: Effort normal, non-labored breathing GI: Soft.  No distension. There is no tenderness.  Skin: WDI  Neuro: Mental Status: Patient is awake  and responds to noxious stimuli by saying "ow".  Patient is nonverbal at baseline.  No clinical seizures visualized. Cranial Nerves: II: Blinks to threat bilaterally III,IV, VI: EOMI without ptosis or diploplia. Pupils equal, round and reactive to light V: Winces to facial noxious stimuli VII: Facial movement is symmetric.  VIII: Looks toward voice  Motor: Moving all extremities antigravity to noxious stimuli.  Increased tone bilateral lower extremities. No jerking or twitching movements noted Sensory: Winces face and states "ow" with noxious stimuli Deep Tendon Reflexes: 2+ and symmetric in the biceps and patellae.  Plantars: Toes are downgoing bilaterally.  Cerebellar: Unable to obtain  Labs I have reviewed labs in epic and the results pertinent to this consultation are:   CBC    Component Value Date/Time   WBC 6.9 02/02/2019 0938   RBC 4.70 02/02/2019 0938   HGB 13.6 02/02/2019 0938   HCT 43.2 02/02/2019 0938   PLT 229 02/02/2019 0938   MCV 91.9 02/02/2019 0938   MCH 28.9 02/02/2019 0938   MCHC 31.5 02/02/2019 0938   RDW 13.0 02/02/2019 0938   LYMPHSABS 1.8 02/02/2019 0938   MONOABS 0.6 02/02/2019 0938   EOSABS 0.2 02/02/2019 0938   BASOSABS 0.0 02/02/2019 0938    CMP     Component Value Date/Time   NA 144 02/02/2019 0938   K 3.9 02/02/2019 0938   CL 107 02/02/2019 0938   CO2 28 02/02/2019 0938   GLUCOSE 105 (H) 02/02/2019 0938   BUN 10 02/02/2019 0938   CREATININE 0.72 02/02/2019 0938   CALCIUM 9.1 02/02/2019 0938   PROT 6.3 (L) 02/02/2019 0938   ALBUMIN 3.2 (L) 02/02/2019 0938   AST 23 02/02/2019 0938   ALT 75 (H) 02/02/2019 0938   ALKPHOS 56 02/02/2019 0938   BILITOT 0.6 02/02/2019 0938   GFRNONAA >60 02/02/2019 0938   GFRAA >60 02/02/2019 0938  Ammonia - 29  Imaging I have reviewed the images obtained: CT-scan of the brain-no acute intracranial abnormality  -- Etta Quill PA-C [facility] [phone]  M-F  (9:00 am- 5:00  PM)  02/02/2019, 5:25 PM   ATTENDING ADDENDUM Seen and examined. Agree with history and physical above. I independently examined the patient. She is not able to provide history. [facility] personally reviewed. No acute changes.  Extensive, advanced for age atrophy (ventricular and sulcal)  Assessment: This is a 64 year old female who resides at skilled nursing facility memory unit and is nonverbal at baseline.  Patient has no known history of seizures however does have known early onset advanced Alzheimer's.  Patient presented to the ED following 1 seizure at skilled nursing facility and then had second witnessed seizure while in the emergency department.  Currently patient is awake and responds to noxious stimuli and showing no clinical seizures.  Patients with dementia have a higher tendency to develop seizures and this could be the etiology for her, but seizure  like activity could also be related to behavioral disturbance in advanced cases. She is on depakote (based on dose, likely for mood or behavior) and the dose is not likely therapeutic for seizure prevention. Imaging and labs unremarkable for acute process. Can adjust Depakote dose and discharge to facility with outpatient follow up.  Impression: -New onset seizure in the setting of dementia  Recommendations: -Check Depakote level--based on level we will recommend loading dose -Start Depakote 500 mg BID at home after loading IV in the ER once the level results. -Follow up with [facility] as out patient -Seizure precautions  D/W Dr. Eulis Foster in the ER  Will follow Depakote level and recommend loading dose.  -- Amie Portland, MD [facility] Pager: [phone] If 7pm to 7am, please call on call as listed on AMION.  ADDENDUM Depakote level 22 Give load IV 1000mg  and then start 500 BID  -- Amie Portland, MD [facility] Pager: [phone] If 7pm to 7am, please call on call as listed on AMION.

## 2019-02-02 NOTE — ED Provider Notes (Addendum)
[facility] EMERGENCY DEPARTMENT Provider Note   CSN: [id number] Arrival date & time: 02/02/19  [id number]     History Chief Complaint  Patient presents with  . Seizures    Barbara Gill is a 64 y.o. female.  Patient brought in from nursing facility.  Known to have Alzheimer's.  Patient is from [facility] unit.  Staff witnessed patient shaking eyes rolling back in her head and then snoring.  EMS reported snoring in route but able to arouse her.  No history of seizures patient nonverbal at baseline.  Upon arrival here patient is looking around moving all extremities.  Patient afebrile temp 97.7 heart rate 81 respirations 12 blood pressure 123/84 oxygen sats 95%.        Past Medical History:  Diagnosis Date  . Alzheimer's disease ([place])   . High cholesterol   . Hyperlipemia   . Hypertension     Patient Active Problem List   Diagnosis Date Noted  . COVID-19 virus infection 01/01/2019  . Acute encephalopathy 01/01/2019  . Hypotension 01/01/2019  . Hip tenderness, left 01/01/2019  . Sinus bradycardia 01/01/2019  . Diabetes ([place]) 11/08/2015  . Alzheimer's disease ([place]) 11/08/2015  . OSA (obstructive sleep apnea) 11/08/2015  . Nicotine dependence 11/08/2015  . Hyperlipemia 11/08/2015  . ADD (attention deficit disorder) 11/08/2015  . Generalized anxiety disorder 11/08/2015  . Depression 11/08/2015    History reviewed. No pertinent surgical history.   OB History   No obstetric history on file.     Family History  Problem Relation Age of Onset  . Cancer Father   . Hypothyroidism Father   . Hypertension Mother        borderline  . Diabetes Mother   . Breast cancer Mother     Social History   Tobacco Use  . Smoking status: Current Every Day Smoker  . Smokeless tobacco: Never Used  . Tobacco comment: 1 pack weekly   Substance Use Topics  . Alcohol use: Yes    Alcohol/week: 0.0 standard drinks    Comment: occas.,once every two  months  . Drug use: No    Home Medications Prior to Admission medications   Medication Sig Start Date End Date Taking? Authorizing Provider  cetirizine (ZYRTEC) 10 MG tablet Take 10 mg by mouth daily.    [provider]  divalproex (DEPAKOTE ER) 250 MG 24 hr tablet Take 1 tablet (250 mg total) by mouth 2 (two) times daily. 01/12/19   Nita Sells, MD  escitalopram (LEXAPRO) 10 MG tablet Take 1 tablet (10 mg total) by mouth daily. 01/12/19   Nita Sells, MD  memantine (NAMENDA XR) 28 MG CP24 24 hr capsule Take 1 capsule (28 mg total) by mouth daily. 01/12/19   Nita Sells, MD  saccharomyces boulardii (FLORASTOR) 250 MG capsule Take 250 mg by mouth daily.    [provider]    Allergies    Seasonal ic [cholestatin] and Poison ivy extract [poison ivy extract]  Review of Systems   Review of Systems  Unable to perform ROS: Dementia  Skin: Negative for rash.  Psychiatric/Behavioral: Positive for confusion.    Physical Exam Updated Vital Signs BP 134/81   Pulse 72   Temp 97.7 F (36.5 C) (Oral)   Resp 16   Ht 1.6 m (5\' 3" )   Wt 81.2 kg   SpO2 100%   BMI 31.71 kg/m   Physical Exam Vitals and nursing note reviewed.  Constitutional:  General: She is not in acute distress.    Appearance: Normal appearance. She is well-developed.  HENT:     Head: Normocephalic and atraumatic.  Eyes:     Conjunctiva/sclera: Conjunctivae normal.     Pupils: Pupils are equal, round, and reactive to light.  Cardiovascular:     Rate and Rhythm: Normal rate and regular rhythm.     Heart sounds: No murmur.  Pulmonary:     Effort: Pulmonary effort is normal. No respiratory distress.     Breath sounds: Normal breath sounds.  Abdominal:     Palpations: Abdomen is soft.     Tenderness: There is no abdominal tenderness.  Musculoskeletal:        General: Normal range of motion.     Cervical back: Normal range of motion and neck supple.  Skin:     General: Skin is warm and dry.     Capillary Refill: Capillary refill takes less than 2 seconds.  Neurological:     Mental Status: She is alert.     Comments: Patient alert moving all extremities spontaneously.  Will look at you.  Will verbalize.  Patient known to be nonverbal according to nursing facility.     ED Results / Procedures / Treatments   Labs (all labs ordered are listed, but only abnormal results are displayed) Labs Reviewed  COMPREHENSIVE METABOLIC PANEL - Abnormal; Notable for the following components:      Result Value   Glucose, Bld 105 (*)    Total Protein 6.3 (*)    Albumin 3.2 (*)    ALT 75 (*)    All other components within normal limits  CBC WITH DIFFERENTIAL/PLATELET  MAGNESIUM  AMMONIA  VALPROIC ACID LEVEL  I-STAT BETA HCG BLOOD, ED ([facility], [facility], [facility] ONLY)    EKG EKG Interpretation  Date/Time:  Thursday February 02 2019 09:42:15 EST Ventricular Rate:  83 PR Interval:    QRS Duration: 98 QT Interval:  408 QTC Calculation: 480 R Axis:   -79 Text Interpretation: Atrial flutter RSR' in V1 or V2, right VCD or RVH Inferior infarct, old Artifact in lead(s) I III aVR aVL V1 V2 Interpretation limited secondary to artifact Confirmed by Fredia Sorrow [phone]) on 02/02/2019 9:44:26 AM   Radiology CT Head Wo Contrast  Result Date: 02/02/2019 CLINICAL DATA:  Altered mental status.  Possible seizure. EXAM: CT HEAD WITHOUT CONTRAST TECHNIQUE: Contiguous axial images were obtained from the base of the skull through the vertex without intravenous contrast. COMPARISON:  MR brain dated January 02, 2019. CT head dated January 01, 2019. FINDINGS: Brain: No evidence of acute infarction, hemorrhage, hydrocephalus, extra-axial collection or mass lesion/mass effect. Unchanged severe cerebral atrophy. Vascular: Calcified atherosclerosis at the skullbase. No hyperdense vessel. Skull: Normal. Negative for fracture or focal lesion. Sinuses/Orbits: No acute finding. Other: None.  IMPRESSION: 1.  No acute intracranial abnormality. Electronically Signed   By: Titus Dubin M.D.   On: 02/02/2019 12:45   DG Chest Port 1 View  Result Date: 02/02/2019 CLINICAL DATA:  Altered mental status with seizure EXAM: PORTABLE CHEST 1 VIEW COMPARISON:  January 06, 2019 FINDINGS: No edema or consolidation. Heart size and pulmonary vascularity are normal. No adenopathy. No bone lesions. No pneumothorax. There is apparent calcification in the left carotid artery. IMPRESSION: No edema or consolidation. Cardiac silhouette within normal limits. Calcification in left carotid artery. Electronically Signed   By: Lowella Grip III M.D.   On: 02/02/2019 10:21    Procedures Procedures (including critical care time)  Medications Ordered in ED Medications - No data to display  ED Course  I have reviewed the triage vital signs and the nursing notes.  Pertinent labs & imaging results that were available during my care of the patient were reviewed by me and considered in my medical decision making (see chart for details).    MDM Rules/Calculators/A&P                       Patient at baseline mental status while here.  No further seizure activity.  Work-up including labs chest x-ray CT head without any acute findings.     Patient's labs without any significant abnormalities.  Feel patient can be discharged back to nursing facility follow-up with outpatient neurology.     Final Clinical Impression(s) / ED Diagnoses Final diagnoses:  Seizure [facility])    Rx / [place] Orders ED Discharge Orders    None       Fredia Sorrow, MD 02/02/19 1549    Fredia Sorrow, MD 02/02/19 1555   Addendum:  Patient was waiting for discharge back to nursing facility. And then had tonic-clonic seizure activity. Now postictal. Patient's IV was already out will reestablish put her on cardiac monitor. Give her 1 mg of Ativan. And that this point will arrange admission. Patient's primary care doctor  is Dr. Darrick Grinder. Still followed by the hospitalist service. And will also notify the neuro hospitalist.  Patient's previous work-up was complete except for a Depakote level. Patient supposedly does not have a history of seizures so think that she is on the Depakote may be more from memory care issues.    Fredia Sorrow, MD 02/02/19 [phone]

## 2019-02-02 NOTE — ED Notes (Signed)
Attempted to call family, Luana Herzberger, x2

## 2019-02-02 NOTE — ED Provider Notes (Signed)
5:10 PM-she has been seen by neurology, who recommend loading with Depacon based on Depakote level.  Patient can then be discharged back to her facility and maintained on Depakote 500 mg twice daily.  I informed Dr. Loletha Grayer, the hospitalist, that he does not need to admit this patient.    Screening test for Covid done and returns as positive.  Patient had COVID-19 infection, 01/01/2019.  This likely indicates persistence of positivity and not acute infection.  IV Depacon ordered by me    Patient Vitals for the past 24 hrs:  BP Temp Temp src Pulse Resp SpO2 Height Weight  02/02/19 2130 119/78 -- -- 82 (!) 22 95 % -- --  02/02/19 1551 128/75 -- -- 64 16 100 % -- --  02/02/19 1200 134/81 -- -- -- 16 -- -- --  02/02/19 1130 135/81 -- -- 72 20 100 % -- --  02/02/19 1100 118/87 -- -- -- (!) 21 -- -- --  02/02/19 1030 119/78 -- -- -- 18 96 % -- --  02/02/19 1000 121/83 -- -- 80 19 96 % -- --  02/02/19 0936 123/84 97.7 F (36.5 C) Oral 81 19 95 % -- --  02/02/19 0932 -- -- -- -- -- -- 5\' 3"  (1.6 m) 81.2 kg    10:10 PM Reevaluation with update and discussion. After initial assessment and treatment, an updated evaluation reveals no change in status at this time.  I discussed these findings with her son, [name], who is her POA.  I answered all of his questions.  He understands that she will be treated with an increased dose of Depakote. Daleen Bo   Medical Decision Making: Seizures, new onset likely related to dementia.  Covid positive status, without evidence for acute Covid infection.  No evidence for acute metabolic abnormality, that would potentiate seizure disorder.  Doubt serious bacterial infection or impending vascular collapse.  CRITICAL CARE-yes Performed by: Daleen Bo   .Critical Care Performed by: Daleen Bo, MD Authorized by: Daleen Bo, MD   Critical care provider statement:    Critical care time (minutes):  35   Critical care start time:  02/02/2019 7:25 PM    Critical care end time:  02/02/2019 10:12 PM   Critical care time was exclusive of:  Separately billable procedures and treating other patients   Critical care was necessary to treat or prevent imminent or life-threatening deterioration of the following conditions:  CNS failure or compromise   Critical care was time spent personally by me on the following activities:  Blood draw for specimens, development of treatment plan with patient or surrogate, discussions with consultants, evaluation of patient's response to treatment, examination of patient, obtaining history from patient or surrogate, ordering and performing treatments and interventions, ordering and review of laboratory studies, pulse oximetry, re-evaluation of patient's condition, review of old charts and ordering and review of radiographic studies     Nursing Notes Reviewed/ Care Coordinated Applicable Imaging Reviewed Interpretation of Laboratory Data incorporated into ED treatment  The patient appears reasonably screened and/or stabilized for discharge and I doubt any other medical condition or other [facility] requiring further screening, evaluation, or treatment in the ED at this time prior to discharge.  Plan: Home Medications-continue usual except increase Depakote to 500 twice daily; Home Treatments-seizure precautions; return here if the recommended treatment, does not improve the symptoms; Recommended follow up-PCP follow-up 1 to 2 weeks for repeat evaluation, and repeat Depakote level.     Daleen Bo, MD 02/02/19 2215

## 2019-02-03 NOTE — ED Notes (Signed)
[facility] called by RN to advised [facility] is returning the patient at this time-Monique,RN

## 2019-02-03 NOTE — ED Notes (Signed)
Red crocs left behind when patient was transferred; Shoes placed in belongings bag with patient label and placed in Lost and found in ED-Monique,RN

## 2019-02-05 ENCOUNTER — Inpatient Hospital Stay (HOSPITAL_COMMUNITY)
Admission: EM | Admit: 2019-02-05 | Discharge: 2019-02-09 | DRG: 871 | Disposition: A | Discharge status: Skilled Nursing Facility | Payer: Medicare (Managed Care) | Source: Skilled Nursing Facility | Attending: Internal Medicine | Admitting: Internal Medicine

## 2019-02-05 ENCOUNTER — Emergency Department (HOSPITAL_COMMUNITY): Payer: Medicare (Managed Care)

## 2019-02-05 ENCOUNTER — Other Ambulatory Visit: Payer: Self-pay

## 2019-02-05 ENCOUNTER — Encounter (HOSPITAL_COMMUNITY): Payer: Self-pay

## 2019-02-05 DIAGNOSIS — G9340 Encephalopathy, unspecified: Secondary | ICD-10-CM | POA: Diagnosis present

## 2019-02-05 DIAGNOSIS — I10 Essential (primary) hypertension: Secondary | ICD-10-CM | POA: Diagnosis present

## 2019-02-05 DIAGNOSIS — E7800 Pure hypercholesterolemia, unspecified: Secondary | ICD-10-CM | POA: Diagnosis present

## 2019-02-05 DIAGNOSIS — G4730 Sleep apnea, unspecified: Secondary | ICD-10-CM | POA: Diagnosis present

## 2019-02-05 DIAGNOSIS — A4189 Other specified sepsis: Principal | ICD-10-CM | POA: Diagnosis present

## 2019-02-05 DIAGNOSIS — F32A Depression, unspecified: Secondary | ICD-10-CM | POA: Diagnosis present

## 2019-02-05 DIAGNOSIS — Z8616 Personal history of COVID-19: Secondary | ICD-10-CM

## 2019-02-05 DIAGNOSIS — E119 Type 2 diabetes mellitus without complications: Secondary | ICD-10-CM | POA: Diagnosis not present

## 2019-02-05 DIAGNOSIS — E876 Hypokalemia: Secondary | ICD-10-CM | POA: Diagnosis present

## 2019-02-05 DIAGNOSIS — E785 Hyperlipidemia, unspecified: Secondary | ICD-10-CM | POA: Diagnosis present

## 2019-02-05 DIAGNOSIS — R6510 Systemic inflammatory response syndrome (SIRS) of non-infectious origin without acute organ dysfunction: Secondary | ICD-10-CM | POA: Diagnosis present

## 2019-02-05 DIAGNOSIS — N390 Urinary tract infection, site not specified: Secondary | ICD-10-CM | POA: Diagnosis present

## 2019-02-05 DIAGNOSIS — G9341 Metabolic encephalopathy: Secondary | ICD-10-CM | POA: Diagnosis present

## 2019-02-05 DIAGNOSIS — B961 Klebsiella pneumoniae [K. pneumoniae] as the cause of diseases classified elsewhere: Secondary | ICD-10-CM | POA: Diagnosis present

## 2019-02-05 DIAGNOSIS — Z66 Do not resuscitate: Secondary | ICD-10-CM | POA: Diagnosis present

## 2019-02-05 DIAGNOSIS — Z833 Family history of diabetes mellitus: Secondary | ICD-10-CM

## 2019-02-05 DIAGNOSIS — R6520 Severe sepsis without septic shock: Secondary | ICD-10-CM | POA: Diagnosis present

## 2019-02-05 DIAGNOSIS — G309 Alzheimer's disease, unspecified: Secondary | ICD-10-CM | POA: Diagnosis not present

## 2019-02-05 DIAGNOSIS — Z79899 Other long term (current) drug therapy: Secondary | ICD-10-CM

## 2019-02-05 DIAGNOSIS — F0280 Dementia in other diseases classified elsewhere without behavioral disturbance: Secondary | ICD-10-CM | POA: Diagnosis present

## 2019-02-05 DIAGNOSIS — R569 Unspecified convulsions: Secondary | ICD-10-CM | POA: Diagnosis present

## 2019-02-05 DIAGNOSIS — F329 Major depressive disorder, single episode, unspecified: Secondary | ICD-10-CM | POA: Diagnosis present

## 2019-02-05 DIAGNOSIS — F319 Bipolar disorder, unspecified: Secondary | ICD-10-CM | POA: Diagnosis present

## 2019-02-05 DIAGNOSIS — F17200 Nicotine dependence, unspecified, uncomplicated: Secondary | ICD-10-CM | POA: Diagnosis present

## 2019-02-05 DIAGNOSIS — Z20822 Contact with and (suspected) exposure to covid-19: Secondary | ICD-10-CM | POA: Diagnosis present

## 2019-02-05 DIAGNOSIS — Z8249 Family history of ischemic heart disease and other diseases of the circulatory system: Secondary | ICD-10-CM

## 2019-02-05 LAB — CBC WITH DIFFERENTIAL/PLATELET
Abs Immature Granulocytes: 0.07 10*3/uL (ref 0.00–0.07)
Basophils Absolute: 0 10*3/uL (ref 0.0–0.1)
Basophils Relative: 0 %
Eosinophils Absolute: 0 10*3/uL (ref 0.0–0.5)
Eosinophils Relative: 0 %
HCT: 50.2 % — ABNORMAL HIGH (ref 36.0–46.0)
Hemoglobin: 15.9 g/dL — ABNORMAL HIGH (ref 12.0–15.0)
Immature Granulocytes: 1 %
Lymphocytes Relative: 10 %
Lymphs Abs: 1.5 10*3/uL (ref 0.7–4.0)
MCH: 29.2 pg (ref 26.0–34.0)
MCHC: 31.7 g/dL (ref 30.0–36.0)
MCV: 92.1 fL (ref 80.0–100.0)
Monocytes Absolute: 1.3 10*3/uL — ABNORMAL HIGH (ref 0.1–1.0)
Monocytes Relative: 9 %
Neutro Abs: 11.6 10*3/uL — ABNORMAL HIGH (ref 1.7–7.7)
Neutrophils Relative %: 80 %
Platelets: 377 10*3/uL (ref 150–400)
RBC: 5.45 MIL/uL — ABNORMAL HIGH (ref 3.87–5.11)
RDW: 13.6 % (ref 11.5–15.5)
WBC: 14.4 10*3/uL — ABNORMAL HIGH (ref 4.0–10.5)
nRBC: 0 % (ref 0.0–0.2)

## 2019-02-05 LAB — URINALYSIS, ROUTINE W REFLEX MICROSCOPIC
Bilirubin Urine: NEGATIVE
Glucose, UA: NEGATIVE mg/dL
Hgb urine dipstick: NEGATIVE
Ketones, ur: 20 mg/dL — AB
Nitrite: NEGATIVE
Protein, ur: 100 mg/dL — AB
Specific Gravity, Urine: 1.028 (ref 1.005–1.030)
pH: 6 (ref 5.0–8.0)

## 2019-02-05 LAB — PROTIME-INR
INR: 1 (ref 0.8–1.2)
Prothrombin Time: 12.6 seconds (ref 11.4–15.2)

## 2019-02-05 LAB — COMPREHENSIVE METABOLIC PANEL
ALT: 84 U/L — ABNORMAL HIGH (ref 0–44)
AST: 40 U/L (ref 15–41)
Albumin: 4.2 g/dL (ref 3.5–5.0)
Alkaline Phosphatase: 67 U/L (ref 38–126)
Anion gap: 14 (ref 5–15)
BUN: 13 mg/dL (ref 8–23)
CO2: 28 mmol/L (ref 22–32)
Calcium: 9.9 mg/dL (ref 8.9–10.3)
Chloride: 99 mmol/L (ref 98–111)
Creatinine, Ser: 0.75 mg/dL (ref 0.44–1.00)
GFR calc Af Amer: >60 mL/min (ref >60)
GFR calc non Af Amer: >60 mL/min (ref >60)
Glucose, Bld: 142 mg/dL — ABNORMAL HIGH (ref 70–99)
Potassium: 3.8 mmol/L (ref 3.5–5.1)
Sodium: 141 mmol/L (ref 135–145)
Total Bilirubin: 0.7 mg/dL (ref 0.3–1.2)
Total Protein: 8.2 g/dL — ABNORMAL HIGH (ref 6.5–8.1)

## 2019-02-05 LAB — APTT: aPTT: 31 seconds (ref 24–36)

## 2019-02-05 LAB — CBG MONITORING, ED: Glucose-Capillary: 141 mg/dL — ABNORMAL HIGH (ref 70–99)

## 2019-02-05 LAB — LIPASE, BLOOD: Lipase: 17 U/L (ref 11–51)

## 2019-02-05 LAB — POC SARS CORONAVIRUS 2 AG -  ED: SARS Coronavirus 2 Ag: NEGATIVE

## 2019-02-05 LAB — LACTIC ACID, PLASMA
Lactic Acid, Venous: 2.6 mmol/L (ref 0.5–1.9)
Lactic Acid, Venous: 2.3 mmol/L (ref 0.5–1.9)

## 2019-02-05 LAB — VALPROIC ACID LEVEL: Valproic Acid Lvl: 24 ug/mL — ABNORMAL LOW (ref 50.0–100.0)

## 2019-02-05 MED ORDER — LACTATED RINGERS IV SOLN: INTRAVENOUS | Status: DC

## 2019-02-05 MED ORDER — SODIUM CHLORIDE 0.9 % IV SOLN
1.0000 g | Freq: Once | INTRAVENOUS | Status: AC
  Administered 2019-02-05: 21:00:00 1 g via INTRAVENOUS

## 2019-02-05 MED ORDER — ACETAMINOPHEN 650 MG RE SUPP
650.0000 mg | Freq: Once | RECTAL | Status: AC
  Administered 2019-02-05: 650 mg via RECTAL
  Filled 2019-02-05: qty 1

## 2019-02-05 MED ORDER — SODIUM CHLORIDE 0.9 % IV SOLN
INTRAVENOUS | Status: AC
  Filled 2019-02-05: qty 10

## 2019-02-05 MED ORDER — SODIUM CHLORIDE 0.9 % IV SOLN
1.0000 g | INTRAVENOUS | Status: DC
  Administered 2019-02-06 – 2019-02-07 (×2): 1 g via INTRAVENOUS
  Filled 2019-02-05 (×2): qty 1

## 2019-02-05 MED ORDER — ACETAMINOPHEN 325 MG PO TABS: 650.0000 mg | ORAL_TABLET | Freq: Four times a day (QID) | ORAL | Status: DC | PRN

## 2019-02-05 MED ORDER — BISACODYL 5 MG PO TBEC: 5.0000 mg | DELAYED_RELEASE_TABLET | Freq: Every day | ORAL | Status: DC | PRN

## 2019-02-05 MED ORDER — ONDANSETRON HCL 4 MG/2ML IJ SOLN: 4.0000 mg | Freq: Four times a day (QID) | INTRAMUSCULAR | Status: DC | PRN

## 2019-02-05 MED ORDER — ACETAMINOPHEN 650 MG RE SUPP: 650.0000 mg | Freq: Four times a day (QID) | RECTAL | Status: DC | PRN

## 2019-02-05 MED ORDER — ENOXAPARIN SODIUM 40 MG/0.4ML ~~LOC~~ SOLN
40.0000 mg | SUBCUTANEOUS | Status: DC
  Administered 2019-02-06 – 2019-02-09 (×4): 40 mg via SUBCUTANEOUS
  Filled 2019-02-05 (×4): qty 0.4

## 2019-02-05 MED ORDER — MAGNESIUM CITRATE PO SOLN: 1.0000 | Freq: Once | ORAL | Status: DC | PRN

## 2019-02-05 MED ORDER — POLYETHYLENE GLYCOL 3350 17 G PO PACK: 17.0000 g | PACK | Freq: Every day | ORAL | Status: DC | PRN

## 2019-02-05 MED ORDER — INSULIN ASPART 100 UNIT/ML ~~LOC~~ SOLN
0.0000 [IU] | Freq: Three times a day (TID) | SUBCUTANEOUS | Status: DC
  Filled 2019-02-05: qty 0.09

## 2019-02-05 MED ORDER — ONDANSETRON HCL 4 MG PO TABS: 4.0000 mg | ORAL_TABLET | Freq: Four times a day (QID) | ORAL | Status: DC | PRN

## 2019-02-05 MED ORDER — SODIUM CHLORIDE 0.9 % IV BOLUS
1000.0000 mL | Freq: Once | INTRAVENOUS | Status: AC
  Administered 2019-02-05: 1000 mL via INTRAVENOUS

## 2019-02-05 NOTE — ED Notes (Signed)
Patient transported to X-ray 

## 2019-02-05 NOTE — ED Provider Notes (Signed)
[facility] DEPT Provider Note   CSN: [id number] Arrival date & time: 02/05/19  [id number]     History Chief Complaint  Patient presents with  . Weakness    Barbara Gill is a 64 y.o. female.  HPI    Patient presents from nursing home with concern for decreased interactivity, malodorous urine, fever. Patient has a history of Alzheimer's, baseline is nonverbal, but does interact, reportedly. Level 5 caveat secondary to dementia. Per report, obtained by EMS providers, the patient has been less interactive than usual, with no fever, malodorous urine. Patient cannot provide any details of her history. Past Medical History:  Diagnosis Date  . Alzheimer's disease ([place])   . High cholesterol   . Hyperlipemia   . Hypertension     Patient Active Problem List   Diagnosis Date Noted  . COVID-19 virus infection 01/01/2019  . Acute encephalopathy 01/01/2019  . Hypotension 01/01/2019  . Hip tenderness, left 01/01/2019  . Sinus bradycardia 01/01/2019  . Diabetes ([place]) 11/08/2015  . Alzheimer's disease ([place]) 11/08/2015  . OSA (obstructive sleep apnea) 11/08/2015  . Nicotine dependence 11/08/2015  . Hyperlipemia 11/08/2015  . ADD (attention deficit disorder) 11/08/2015  . Generalized anxiety disorder 11/08/2015  . Depression 11/08/2015    No past surgical history on file.   OB History   No obstetric history on file.     Family History  Problem Relation Age of Onset  . Cancer Father   . Hypothyroidism Father   . Hypertension Mother        borderline  . Diabetes Mother   . Breast cancer Mother     Social History   Tobacco Use  . Smoking status: Current Every Day Smoker  . Smokeless tobacco: Never Used  . Tobacco comment: 1 pack weekly   Substance Use Topics  . Alcohol use: Yes    Alcohol/week: 0.0 standard drinks    Comment: occas.,once every two months  . Drug use: No    Home Medications Prior to Admission medications     Medication Sig Start Date End Date Taking? Authorizing Provider  divalproex (DEPAKOTE) 500 MG DR tablet Take 1 tablet (500 mg total) by mouth 2 (two) times daily. 02/02/19  Yes Daleen Bo, MD  escitalopram (LEXAPRO) 10 MG tablet Take 1 tablet (10 mg total) by mouth daily. 01/12/19  Yes Nita Sells, MD  loratadine (CLARITIN) 10 MG tablet Take 10 mg by mouth daily.   Yes [provider]  memantine (NAMENDA XR) 28 MG CP24 24 hr capsule Take 1 capsule (28 mg total) by mouth daily. 01/12/19  Yes Nita Sells, MD  saccharomyces boulardii (FLORASTOR) 250 MG capsule Take 250 mg by mouth daily.   Yes [provider]    Allergies    Seasonal ic [cholestatin] and Poison ivy extract [poison ivy extract]  Review of Systems   Review of Systems  Unable to perform ROS: Dementia    Physical Exam Updated Vital Signs BP 128/75 (BP Location: Right Arm)   Pulse (!) 113   Temp (!) 101.7 F (38.7 C) (Rectal)   Resp 17   SpO2 93%   Physical Exam Vitals and nursing note reviewed.  Constitutional:      Appearance: She is well-developed. She is ill-appearing.     Comments: Unwell appearing large elderly female nonverbal  HENT:     Head: Normocephalic and atraumatic.  Eyes:     Conjunctiva/sclera: Conjunctivae normal.  Cardiovascular:     Rate and Rhythm: Regular  rhythm. Tachycardia present.  Pulmonary:     Effort: Pulmonary effort is normal. No respiratory distress.     Breath sounds: Normal breath sounds. No stridor.  Abdominal:     General: There is no distension.     Tenderness: There is no guarding.  Musculoskeletal:        General: No deformity.  Skin:    General: Skin is warm and dry.  Neurological:     Cranial Nerves: No cranial nerve deficit.     Comments: Noninteractive, moves all extremities minimally, spontaneously, does not follow commands.  Psychiatric:        Cognition and Memory: Cognition is impaired.     ED Results / Procedures /  Treatments   Labs (all labs ordered are listed, but only abnormal results are displayed) Labs Reviewed  COMPREHENSIVE METABOLIC PANEL - Abnormal; Notable for the following components:      Result Value   Glucose, Bld 142 (*)    Total Protein 8.2 (*)    ALT 84 (*)    All other components within normal limits  CBC WITH DIFFERENTIAL/PLATELET - Abnormal; Notable for the following components:   WBC 14.4 (*)    RBC 5.45 (*)    Hemoglobin 15.9 (*)    HCT 50.2 (*)    Neutro Abs 11.6 (*)    Monocytes Absolute 1.3 (*)    All other components within normal limits  URINALYSIS, ROUTINE W REFLEX MICROSCOPIC - Abnormal; Notable for the following components:   Color, Urine AMBER (*)    APPearance CLOUDY (*)    Ketones, ur 20 (*)    Protein, ur 100 (*)    Leukocytes,Ua TRACE (*)    Bacteria, UA MANY (*)    All other components within normal limits  LACTIC ACID, PLASMA - Abnormal; Notable for the following components:   Lactic Acid, Venous 2.6 (*)    All other components within normal limits  VALPROIC ACID LEVEL - Abnormal; Notable for the following components:   Valproic Acid Lvl 24 (*)    All other components within normal limits  CBG MONITORING, ED - Abnormal; Notable for the following components:   Glucose-Capillary 141 (*)    All other components within normal limits  CULTURE, BLOOD (ROUTINE X 2)  CULTURE, BLOOD (ROUTINE X 2)  URINE CULTURE  LIPASE, BLOOD  APTT  PROTIME-INR  LACTIC ACID, PLASMA  POC SARS CORONAVIRUS 2 AG -  ED    Radiology CT Head Wo Contrast  Result Date: 02/05/2019 CLINICAL DATA:  Early-onset dementia. Mental status change. Malodorous urine. EXAM: CT HEAD WITHOUT CONTRAST TECHNIQUE: Contiguous axial images were obtained from the base of the skull through the vertex without intravenous contrast. COMPARISON:  February 02, 2019 FINDINGS: Brain: No subdural, epidural, or subarachnoid hemorrhage. Ventricles and sulci are prominent but stable. Cerebellum, brainstem, and  basal cisterns are normal. No mass effect or midline shift. White matter changes again noted. No acute cortical ischemia or infarct. Vascular: No hyperdense vessel or unexpected calcification. Skull: Normal. Negative for fracture or focal lesion. Sinuses/Orbits: No acute finding. Other: None. IMPRESSION: No acute intracranial abnormalities or changes. Electronically Signed   By: Dorise Bullion III M.D   On: 02/05/2019 19:43   DG Chest Port 1 View  Result Date: 02/05/2019 CLINICAL DATA:  64 year old female with altered mental status and fever. History of dementia. EXAM: PORTABLE CHEST 1 VIEW COMPARISON:  Portable chest 02/02/2019 and earlier. FINDINGS: Portable AP semi upright view at 1922 hours. Lower lung volumes.  Normal cardiac size and mediastinal contours. Visualized tracheal air column is within normal limits. Allowing for portable technique the lungs remain clear. Paucity of bowel gas in the upper abdomen. No acute osseous abnormality identified. IMPRESSION: Lower lung volumes.  No acute cardiopulmonary abnormality. Electronically Signed   By: Genevie Ann M.D.   On: 02/05/2019 19:38    Procedures Procedures (including critical care time)  Medications Ordered in ED Medications  cefTRIAXone (ROCEPHIN) 1 g in sodium chloride 0.9 % 100 mL IVPB (has no administration in time range)  sodium chloride 0.9 % bolus 1,000 mL (0 mLs Intravenous Stopped 02/05/19 2103)  acetaminophen (TYLENOL) suppository 650 mg (650 mg Rectal Given 02/05/19 1941)    ED Course  I have reviewed the triage vital signs and the nursing notes.  Pertinent labs & imaging results that were available during my care of the patient were reviewed by me and considered in my medical decision making (see chart for details).  On arrival patient is tachycardic, febrile, but not hypotensive.  She meets multiple SIRS criteria.  Chart review notable for recent evaluation for seizures, the patient is seemingly taking Depakote as well  now.  Patient with lactic acidosis, leukocytosis, and urinalysis concerning for infection.  9:08 PM Patient moderately tachycardic.  9:58 PM Covid negative, unknowingly, the patient had a positive test result 1 month ago.  He notes that beyond recent evaluation for seizures, initiation of Depakote she had been recovering generally well from her hospitalization for Covid. However, today the patient was less interactive than usual, had one episode of vomiting, and was sent here for evaluation. We discussed goals of care, and the patient's husband notes that she is in the latter stages of dementia, and is DNR.   MDM Rules/Calculators/A&P                     This elderly female with dementia presents with a decreased interactivity, febrile, tachycardic from her nursing home. Patient with multiple lab abnormalities concerning for infection, she meets multiple SIRS criteria, but is not hypotensive initially. Patient received antibiotics for likely urinary source, Covid test pending, and with altered mental status, SIRS, UTI, she will require admission for further monitoring, management.  Final Clinical Impression(s) / ED Diagnoses Final diagnoses:  SIRS (systemic inflammatory response syndrome) ([place])  Lower urinary tract infectious disease     Carmin Muskrat, MD 02/05/19 2204

## 2019-02-05 NOTE — H&P (Addendum)
History and Physical    Barbara Gill [id number] DOB: 26-Dec-1955 DOA: 02/05/2019  PCP: Marton Redwood, MD   Patient coming from: SNF  I have personally briefly reviewed patient's old medical records in [place]  Chief Complaint: less interactive  HPI: Barbara Gill is a 64 y.o. female with medical history significant of advanced Alzheimer's dementia (nonverbal at baseline), hypertension, bipolar disorder who presented to the ED from her nursing home being less interactive at her baseline malodorous urine.  Patient also reportedly had a fever at the facility.  She is nonverbal at baseline, but does interact with others typically.  Patient is unable to history due to advanced dementia.  History was obtained from prior records and ED report.  During my encounter, patient sleeping comfortably, snoring, awoke to voice but did not interact.  No sign of distress or discomfort.  Patient was admitted with COVID-19 infection back in December, from 01/01/2019 through 01/13/2019.  She initially did have acute respiratory failure which resolved prior to discharge.  Patient also had E. coli UTI during that admission but was asymptomatic per records and only received single dose fosfomycin on 12/8.  Review of culture from that time grew pansensitive E. coli, negative ESBL.  ED Course: Febrile 101.7 F, tachycardic 113 O2 sat 93% on room air, normotensive.  Labs notable for ALT 84, lactic acid 2.6, leukocytosis 14.4k (neutrophil predominant).  UA showed cloudy urine, with many bacteria, 11-20 WBC, and trace leukocytes.  Urine and blood cultures obtained.  POC COVID-19 test negative.  CT head without acute findings.  Chest x-ray also negative.  Patient was treated for UTI with Rocephin and admitted as obs to hospitalist service for further management.   Review of Systems: Unable to obtain due to patient with advanced dementia and nonverbal.   Past Medical History:  Diagnosis Date  . Alzheimer's  disease ([place])   . High cholesterol   . Hyperlipemia   . Hypertension     No past surgical history on file.   reports that she has been smoking. She has never used smokeless tobacco. She reports current alcohol use. She reports that she does not use drugs.  Allergies  Allergen Reactions  . Seasonal Ic [Cholestatin]     Runny nose, itchy eyes  . Poison Ivy Extract [Poison Ivy Extract] Rash    Family History  Problem Relation Age of Onset  . Cancer Father   . Hypothyroidism Father   . Hypertension Mother        borderline  . Diabetes Mother   . Breast cancer Mother      Prior to Admission medications   Medication Sig Start Date End Date Taking? Authorizing Provider  divalproex (DEPAKOTE) 500 MG DR tablet Take 1 tablet (500 mg total) by mouth 2 (two) times daily. 02/02/19  Yes Daleen Bo, MD  escitalopram (LEXAPRO) 10 MG tablet Take 1 tablet (10 mg total) by mouth daily. 01/12/19  Yes Nita Sells, MD  loratadine (CLARITIN) 10 MG tablet Take 10 mg by mouth daily.   Yes [provider]  memantine (NAMENDA XR) 28 MG CP24 24 hr capsule Take 1 capsule (28 mg total) by mouth daily. 01/12/19  Yes Nita Sells, MD  saccharomyces boulardii (FLORASTOR) 250 MG capsule Take 250 mg by mouth daily.   Yes [provider]    Physical Exam: Vitals:   02/05/19 1852  BP: 128/75  Pulse: (!) 113  Resp: 17  Temp: (!) 101.7 F (38.7 C)  TempSrc:  Rectal  SpO2: 93%    Constitutional: NAD, mildly ill appearing Eyes: conjunctival injection bilaterally, lids normal, PERRL, unable to test EOM's ENMT: Mucous membranes are dry.  Unable to evaluate posterior oropharynx due to patient not following commands Neck: supple, nontender, no cervical lymaphadenopathy Respiratory: clear to auscultation bilaterally, normal respiratory effort.  Cardiovascular: Regular rate and rhythm, no murmurs / rubs / gallops. No extremity edema. 2+ pedal pulses.  Abdomen: no  tenderness, no distention, no guarding, positive bowel sounds Musculoskeletal: no clubbing / cyanosis. No joint deformity upper and lower extremities.  Skin: Dry, warm to touch, no rashes seen Neurologic: Nonverbal.  Does not follow commands. Psychiatric: Unable to evaluate due to advanced dementia.  Not appear to be having hallucinations.   Labs on Admission: I have personally reviewed following labs and imaging studies  CBC: Recent Labs  Lab 02/02/19 0938 02/05/19 1913  WBC 6.9 14.4*  NEUTROABS 4.2 11.6*  HGB 13.6 15.9*  HCT 43.2 50.2*  MCV 91.9 92.1  PLT 229 [id number]   Basic Metabolic Panel: Recent Labs  Lab 02/02/19 0938 02/05/19 1913  NA 144 141  K 3.9 3.8  CL 107 99  CO2 28 28  GLUCOSE 105* 142*  BUN 10 13  CREATININE 0.72 0.75  CALCIUM 9.1 9.9  MG 2.0  --    GFR: Estimated Creatinine Clearance: 74.5 mL/min (by C-G formula based on SCr of 0.75 mg/dL). Liver Function Tests: Recent Labs  Lab 02/02/19 0938 02/05/19 1913  AST 23 40  ALT 75* 84*  ALKPHOS 56 67  BILITOT 0.6 0.7  PROT 6.3* 8.2*  ALBUMIN 3.2* 4.2   Recent Labs  Lab 02/05/19 1913  LIPASE 17   Recent Labs  Lab 02/02/19 1013  AMMONIA 29   Coagulation Profile: Recent Labs  Lab 02/05/19 1913  INR 1.0   Cardiac Enzymes: No results for input(s): CKTOTAL, CKMB, CKMBINDEX, TROPONINI in the last 168 hours. BNP (last 3 results) No results for input(s): PROBNP in the last 8760 hours. HbA1C: No results for input(s): HGBA1C in the last 72 hours. CBG: Recent Labs  Lab 02/05/19 1858  GLUCAP 141*   Lipid Profile: No results for input(s): CHOL, HDL, LDLCALC, TRIG, CHOLHDL, LDLDIRECT in the last 72 hours. Thyroid Function Tests: No results for input(s): TSH, T4TOTAL, FREET4, T3FREE, THYROIDAB in the last 72 hours. Anemia Panel: No results for input(s): VITAMINB12, FOLATE, FERRITIN, TIBC, IRON, RETICCTPCT in the last 72 hours. Urine analysis:    Component Value Date/Time   COLORURINE AMBER  (A) 02/05/2019 1839   APPEARANCEUR CLOUDY (A) 02/05/2019 1839   LABSPEC 1.028 02/05/2019 1839   PHURINE 6.0 02/05/2019 1839   GLUCOSEU NEGATIVE 02/05/2019 1839   HGBUR NEGATIVE 02/05/2019 1839   BILIRUBINUR NEGATIVE 02/05/2019 1839   KETONESUR 20 (A) 02/05/2019 1839   PROTEINUR 100 (A) 02/05/2019 1839   NITRITE NEGATIVE 02/05/2019 1839   LEUKOCYTESUR TRACE (A) 02/05/2019 1839    Radiological Exams on Admission: CT Head Wo Contrast  Result Date: 02/05/2019 CLINICAL DATA:  Early-onset dementia. Mental status change. Malodorous urine. EXAM: CT HEAD WITHOUT CONTRAST TECHNIQUE: Contiguous axial images were obtained from the base of the skull through the vertex without intravenous contrast. COMPARISON:  February 02, 2019 FINDINGS: Brain: No subdural, epidural, or subarachnoid hemorrhage. Ventricles and sulci are prominent but stable. Cerebellum, brainstem, and basal cisterns are normal. No mass effect or midline shift. White matter changes again noted. No acute cortical ischemia or infarct. Vascular: No hyperdense vessel or unexpected calcification. Skull: Normal. Negative  for fracture or focal lesion. Sinuses/Orbits: No acute finding. Other: None. IMPRESSION: No acute intracranial abnormalities or changes. Electronically Signed   By: Dorise Bullion III M.D   On: 02/05/2019 19:43   DG Chest Port 1 View  Result Date: 02/05/2019 CLINICAL DATA:  64 year old female with altered mental status and fever. History of dementia. EXAM: PORTABLE CHEST 1 VIEW COMPARISON:  Portable chest 02/02/2019 and earlier. FINDINGS: Portable AP semi upright view at 1922 hours. Lower lung volumes. Normal cardiac size and mediastinal contours. Visualized tracheal air column is within normal limits. Allowing for portable technique the lungs remain clear. Paucity of bowel gas in the upper abdomen. No acute osseous abnormality identified. IMPRESSION: Lower lung volumes.  No acute cardiopulmonary abnormality. Electronically Signed    By: Genevie Ann M.D.   On: 02/05/2019 19:38    EKG: Independently reviewed.   Assessment/Plan Principal Problem:   Acute lower UTI Active Problems:   Acute encephalopathy   SIRS (systemic inflammatory response syndrome) ([facility])   Diabetes ([facility])   Alzheimer's disease ([place])   Depression   Acute lower UTI -presented meeting SIRS criteria with fever and tachycardia, UA showing possible UTI.  Normotensive, without endorgan damage and not septic appearing patient.  Treated with Rocephin and LR bolus in the ED.  Prior urine culture grew pansensitive E. coli in December 2020.  Unclear if patient symptomatic as she is nonverbal. -Continue Rocephin for now -Follow-up urine culture --Repeat lactic acid pending --continue LR @ 75 cc/hr x 1 day  Acute encephalopathy -superimposed on advanced Alzheimer's dementia.  Per nursing home and husband's report, patient has been less interactive than at baseline.  Suspect this is secondary to UTI. --Monitor for improvement with treatment as above  SIRS (systemic inflammatory response syndrome) ([facility]) -patient presented with fever, tachycardia, leukocytosis and UA showing possible UTI.  No sign of endorgan damage or overt sepsis present.  UA with bacteruria, unknown if symptomatic due to nonverbal, advanced dementia. --Monitor response to above treatment --Follow-up repeat lactic acid  Type 2 Diabetes ([facility]) -does not appear to be on medications outpatient.  No A1c on file.  Glucose on metabolic panel [id number]. --Sensitive sliding scale NovoLog with meals --Follow-up A1c, pending  Alzheimer's disease ([place]) -advanced, nonverbal at baseline -Continue Namenda  Depression, Bipolar Disorder -chronic, appears stable -Continue Lexapro, Depakote  DVT prophylaxis: Lovenox Code Status: DNR -ED physician confirmed with patient's husband prior to admission Family Communication: ED physician informed patient's husband of plan for admission and confirmed CODE  STATUS Disposition Plan: Expect medically stable for discharge in 24 to 48 hours Consults called: None  Admission status: obs    Ezekiel Slocumb, DO [facility]   If 7PM-7AM, please contact night-coverage [email or web address] Password [facility]  02/05/2019, 10:13 PM

## 2019-02-05 NOTE — ED Triage Notes (Signed)
She come to [place] from [place] (eraly-onset dementia. She is nearly non-verbal at baseline. Her husband phoned EMS as she was not as "reactive as usual". Paramedics noted a malodorous urine smell upon their arrival. Her eyes are open and she mutters only guttural sounds. She is breathing normally and in no distress.

## 2019-02-05 NOTE — ED Notes (Signed)
Called lab to add on A1C.  

## 2019-02-05 NOTE — ED Notes (Signed)
ED TO INPATIENT HANDOFF REPORT  Name/Age/Gender Barbara Gill Duty 64 y.o. female  Code Status    Code Status Orders  (From admission, onward)         Start     Ordered   02/05/19 2206  Do not attempt resuscitation (DNR)  Continuous    Question Answer Comment  In the event of cardiac or respiratory ARREST Do not call a "code blue"   In the event of cardiac or respiratory ARREST Do not perform Intubation, CPR, defibrillation or ACLS   In the event of cardiac or respiratory ARREST Use medication by any route, position, wound care, and other measures to relive pain and suffering. May use oxygen, suction and manual treatment of airway obstruction as needed for comfort.      02/05/19 2212        Code Status History    Date Active Date Inactive Code Status Order ID Comments User Context   02/05/2019 2204 02/05/2019 2212 DNR [id number]  Carmin Muskrat, MD ED   01/01/2019 2331 01/13/2019 1713 Full Code [id number]  Shela Leff, MD ED   Advance Care Planning Activity      Home/SNF/Other Nursing Home  Chief Complaint UTI (urinary tract infection) [N39.0]  Level of Care/Admitting Diagnosis ED Disposition    ED Disposition Condition [facility]: [facility] [[id number]]  Level of Care: Med-Surg [16]  Covid Evaluation: Confirmed COVID Negative  Diagnosis: UTI (urinary tract infection) [867544]  Admitting Physician: Ezekiel Slocumb [[id number]]  Attending Physician: Ezekiel Slocumb [[id number]]  PT Class (Do Not Modify): Observation [104]  PT Acc Code (Do Not Modify): Observation [10022]       Medical History Past Medical History:  Diagnosis Date  . Alzheimer's disease ([place])   . High cholesterol   . Hyperlipemia   . Hypertension     Allergies Allergies  Allergen Reactions  . Seasonal Ic [Cholestatin]     Runny nose, itchy eyes  . Poison Ivy Extract [Poison Ivy Extract] Rash    IV Location/Drains/Wounds Patient  Lines/Drains/Airways Status   Active Line/Drains/Airways    Name:   Placement date:   Placement time:   Site:   Days:   Peripheral IV 02/05/19 Posterior;Right Hand   02/05/19    1810    Hand   less than 1          Labs/Imaging Results for orders placed or performed during the hospital encounter of 02/05/19 (from the past 48 hour(s))  Urinalysis, Routine w reflex microscopic     Status: Abnormal   Collection Time: 02/05/19  6:39 PM  Result Value Ref Range   Color, Urine AMBER (A) YELLOW    Comment: BIOCHEMICALS MAY BE AFFECTED BY COLOR   APPearance CLOUDY (A) CLEAR   Specific Gravity, Urine 1.028 1.005 - 1.030   pH 6.0 5.0 - 8.0   Glucose, UA NEGATIVE NEGATIVE mg/dL   Hgb urine dipstick NEGATIVE NEGATIVE   Bilirubin Urine NEGATIVE NEGATIVE   Ketones, ur 20 (A) NEGATIVE mg/dL   Protein, ur 100 (A) NEGATIVE mg/dL   Nitrite NEGATIVE NEGATIVE   Leukocytes,Ua TRACE (A) NEGATIVE   RBC / HPF 0-5 0 - 5 RBC/hpf   WBC, UA 11-20 0 - 5 WBC/hpf   Bacteria, UA MANY (A) NONE SEEN   Squamous Epithelial / LPF 0-5 0 - 5   WBC Clumps PRESENT     Comment: Performed at [facility], [address] [place], [place]  [address]  CBG monitoring, ED     Status: Abnormal   Collection Time: 02/05/19  6:58 PM  Result Value Ref Range   Glucose-Capillary 141 (H) 70 - 99 mg/dL  Comprehensive metabolic panel     Status: Abnormal   Collection Time: 02/05/19  7:13 PM  Result Value Ref Range   Sodium 141 135 - 145 mmol/L   Potassium 3.8 3.5 - 5.1 mmol/L   Chloride 99 98 - 111 mmol/L   CO2 28 22 - 32 mmol/L   Glucose, Bld 142 (H) 70 - 99 mg/dL   BUN 13 8 - 23 mg/dL   Creatinine, Ser 0.75 0.44 - 1.00 mg/dL   Calcium 9.9 8.9 - 10.3 mg/dL   Total Protein 8.2 (H) 6.5 - 8.1 g/dL   Albumin 4.2 3.5 - 5.0 g/dL   AST 40 15 - 41 U/L   ALT 84 (H) 0 - 44 U/L   Alkaline Phosphatase 67 38 - 126 U/L   Total Bilirubin 0.7 0.3 - 1.2 mg/dL   GFR calc non Af Amer >60 >60 mL/min   GFR calc Af Amer >60  >60 mL/min   Anion gap 14 5 - 15    Comment: Performed at [facility], [place] [address] [place], [place] [address]  Lipase, blood     Status: None   Collection Time: 02/05/19  7:13 PM  Result Value Ref Range   Lipase 17 11 - 51 U/L    Comment: Performed at [facility], [place] [address] [place], [place] [address]  CBC with Differential     Status: Abnormal   Collection Time: 02/05/19  7:13 PM  Result Value Ref Range   WBC 14.4 (H) 4.0 - 10.5 K/uL   RBC 5.45 (H) 3.87 - 5.11 MIL/uL   Hemoglobin 15.9 (H) 12.0 - 15.0 g/dL   HCT 50.2 (H) 36.0 - 46.0 %   MCV 92.1 80.0 - 100.0 fL   MCH 29.2 26.0 - 34.0 pg   MCHC 31.7 30.0 - 36.0 g/dL   RDW 13.6 11.5 - 15.5 %   Platelets 377 150 - 400 K/uL   nRBC 0.0 0.0 - 0.2 %   Neutrophils Relative % 80 %   Neutro Abs 11.6 (H) 1.7 - 7.7 K/uL   Lymphocytes Relative 10 %   Lymphs Abs 1.5 0.7 - 4.0 K/uL   Monocytes Relative 9 %   Monocytes Absolute 1.3 (H) 0.1 - 1.0 K/uL   Eosinophils Relative 0 %   Eosinophils Absolute 0.0 0.0 - 0.5 K/uL   Basophils Relative 0 %   Basophils Absolute 0.0 0.0 - 0.1 K/uL   Immature Granulocytes 1 %   Abs Immature Granulocytes 0.07 0.00 - 0.07 K/uL    Comment: Performed at [facility], [place] [address], [place], [place] [address]  Lactic acid, plasma     Status: Abnormal   Collection Time: 02/05/19  7:13 PM  Result Value Ref Range   Lactic Acid, Venous 2.6 (HH) 0.5 - 1.9 mmol/L    Comment: CRITICAL RESULT CALLED TO, READ BACK BY AND VERIFIED WITH: J.Everlean Patterson [id number] '@2036'$  BY V.WILKINS Performed at [facility], [place] [address], [place], [place] [address]   APTT     Status: None   Collection Time: 02/05/19  7:13 PM  Result Value Ref Range   aPTT 31 24 - 36 seconds    Comment: Performed at [facility], [place] [address] [place], [place] [address]  Protime-INR     Status: None   Collection Time: 02/05/19  7:13 PM  Result  Value Ref Range   Prothrombin Time 12.6 11.4 - 15.2 seconds   INR 1.0 0.8 - 1.2    Comment: (NOTE) INR goal varies based on device and disease states. Performed at [facility], [place] [address], [place], [place] [address]   Valproic acid level     Status: Abnormal   Collection Time: 02/05/19  7:13 PM  Result Value Ref Range   Valproic Acid Lvl 24 (L) 50.0 - 100.0 ug/mL    Comment: Performed at [facility], [place] [address], [place], [place] [address]  Lactic acid, plasma     Status: Abnormal   Collection Time: 02/05/19  8:56 PM  Result Value Ref Range   Lactic Acid, Venous 2.3 (HH) 0.5 - 1.9 mmol/L    Comment: CRITICAL RESULT CALLED TO, READ BACK BY AND VERIFIED WITH: Delight Ovens, RN ON 02/05/19 @ 2326 BY LE Performed at [facility], [place] [address] [place], [place] [address]   POC SARS Coronavirus 2 Ag-ED - Nasal Swab (BD Veritor Kit)     Status: None   Collection Time: 02/05/19  9:34 PM  Result Value Ref Range   SARS Coronavirus 2 Ag NEGATIVE NEGATIVE    Comment: (NOTE) SARS-CoV-2 antigen NOT DETECTED.  Negative results are presumptive.  Negative results do not preclude SARS-CoV-2 infection and should not be used as the sole basis for treatment or other patient management decisions, including infection  control decisions, particularly in the presence of clinical signs and  symptoms consistent with COVID-19, or in those who have been in contact with the virus.  Negative results must be combined with clinical observations, patient history, and epidemiological information. The expected result is Negative. Fact Sheet for Patients: PodPark.tn Fact Sheet for Healthcare Providers: GiftContent.is This test is not yet approved or cleared by the [place] FDA and  has been authorized for detection and/or diagnosis of SARS-CoV-2 by FDA under an Emergency Use Authorization  (EUA).  This EUA will remain in effect (meaning this test can be used) for the duration of  the COVID-19 de claration under Section 564(b)(1) of the Act, 21 U.S.C. section 360bbb-3(b)(1), unless the authorization is terminated or revoked sooner.    CT Head Wo Contrast  Result Date: 02/05/2019 CLINICAL DATA:  Early-onset dementia. Mental status change. Malodorous urine. EXAM: CT HEAD WITHOUT CONTRAST TECHNIQUE: Contiguous axial images were obtained from the base of the skull through the vertex without intravenous contrast. COMPARISON:  February 02, 2019 FINDINGS: Brain: No subdural, epidural, or subarachnoid hemorrhage. Ventricles and sulci are prominent but stable. Cerebellum, brainstem, and basal cisterns are normal. No mass effect or midline shift. White matter changes again noted. No acute cortical ischemia or infarct. Vascular: No hyperdense vessel or unexpected calcification. Skull: Normal. Negative for fracture or focal lesion. Sinuses/Orbits: No acute finding. Other: None. IMPRESSION: No acute intracranial abnormalities or changes. Electronically Signed   By: Dorise Bullion III M.D   On: 02/05/2019 19:43   DG Chest Port 1 View  Result Date: 02/05/2019 CLINICAL DATA:  64 year old female with altered mental status and fever. History of dementia. EXAM: PORTABLE CHEST 1 VIEW COMPARISON:  Portable chest 02/02/2019 and earlier. FINDINGS: Portable AP semi upright view at 1922 hours. Lower lung volumes. Normal cardiac size and mediastinal contours. Visualized tracheal air column is within normal limits. Allowing for portable technique the lungs remain clear. Paucity of bowel  gas in the upper abdomen. No acute osseous abnormality identified. IMPRESSION: Lower lung volumes.  No acute cardiopulmonary abnormality. Electronically Signed   By: Genevie Ann M.D.   On: 02/05/2019 19:38    Pending Labs Unresulted Labs (From admission, onward)    Start     Ordered   02/06/19 9563  Basic metabolic panel  Tomorrow  morning,   R     02/05/19 2212   02/06/19 0500  CBC  Tomorrow morning,   R     02/05/19 2212   02/05/19 2234  Hemoglobin A1c  Once,   STAT    Comments: To assess prior glycemic control    02/05/19 2234   02/05/19 2211  Urine culture  Once,   STAT     02/05/19 2212   02/05/19 1856  Blood Culture (routine x 2)  BLOOD CULTURE X 2,   STAT     02/05/19 1855   02/05/19 1856  Urine culture  ONCE - STAT,   STAT     02/05/19 1855          Vitals/Pain Today's Vitals   02/05/19 1852  BP: 128/75  Pulse: (!) 113  Resp: 17  Temp: (!) 101.7 F (38.7 C)  TempSrc: Rectal  SpO2: 93%    Isolation Precautions No active isolations  Medications Medications  sodium chloride 0.9 % with cefTRIAXone (ROCEPHIN) ADS Med (has no administration in time range)  enoxaparin (LOVENOX) injection 40 mg (has no administration in time range)  acetaminophen (TYLENOL) tablet 650 mg (has no administration in time range)    Or  acetaminophen (TYLENOL) suppository 650 mg (has no administration in time range)  polyethylene glycol (MIRALAX / GLYCOLAX) packet 17 g (has no administration in time range)  bisacodyl (DULCOLAX) EC tablet 5 mg (has no administration in time range)  magnesium citrate solution 1 Bottle (has no administration in time range)  ondansetron (ZOFRAN) tablet 4 mg (has no administration in time range)    Or  ondansetron (ZOFRAN) injection 4 mg (has no administration in time range)  cefTRIAXone (ROCEPHIN) 1 g in sodium chloride 0.9 % 100 mL IVPB (has no administration in time range)  insulin aspart (novoLOG) injection 0-9 Units (has no administration in time range)  lactated ringers infusion (has no administration in time range)  sodium chloride 0.9 % bolus 1,000 mL (0 mLs Intravenous Stopped 02/05/19 2103)  acetaminophen (TYLENOL) suppository 650 mg (650 mg Rectal Given 02/05/19 1941)  cefTRIAXone (ROCEPHIN) 1 g in sodium chloride 0.9 % 100 mL IVPB (0 g Intravenous Stopped 02/05/19 2221)     Mobility non-ambulatory

## 2019-02-05 NOTE — ED Notes (Addendum)
Admitting MD paged regarding covid test

## 2019-02-06 DIAGNOSIS — G300 Alzheimer's disease with early onset: Secondary | ICD-10-CM

## 2019-02-06 DIAGNOSIS — Z20822 Contact with and (suspected) exposure to covid-19: Secondary | ICD-10-CM | POA: Diagnosis present

## 2019-02-06 DIAGNOSIS — G9340 Encephalopathy, unspecified: Secondary | ICD-10-CM | POA: Diagnosis not present

## 2019-02-06 DIAGNOSIS — G9341 Metabolic encephalopathy: Secondary | ICD-10-CM | POA: Diagnosis present

## 2019-02-06 DIAGNOSIS — F0280 Dementia in other diseases classified elsewhere without behavioral disturbance: Secondary | ICD-10-CM | POA: Diagnosis present

## 2019-02-06 DIAGNOSIS — B961 Klebsiella pneumoniae [K. pneumoniae] as the cause of diseases classified elsewhere: Secondary | ICD-10-CM | POA: Diagnosis present

## 2019-02-06 DIAGNOSIS — Z8249 Family history of ischemic heart disease and other diseases of the circulatory system: Secondary | ICD-10-CM | POA: Diagnosis not present

## 2019-02-06 DIAGNOSIS — I10 Essential (primary) hypertension: Secondary | ICD-10-CM | POA: Diagnosis present

## 2019-02-06 DIAGNOSIS — Z66 Do not resuscitate: Secondary | ICD-10-CM | POA: Diagnosis present

## 2019-02-06 DIAGNOSIS — R6510 Systemic inflammatory response syndrome (SIRS) of non-infectious origin without acute organ dysfunction: Secondary | ICD-10-CM | POA: Diagnosis not present

## 2019-02-06 DIAGNOSIS — E785 Hyperlipidemia, unspecified: Secondary | ICD-10-CM | POA: Diagnosis present

## 2019-02-06 DIAGNOSIS — A4189 Other specified sepsis: Secondary | ICD-10-CM | POA: Diagnosis present

## 2019-02-06 DIAGNOSIS — E1165 Type 2 diabetes mellitus with hyperglycemia: Secondary | ICD-10-CM

## 2019-02-06 DIAGNOSIS — N390 Urinary tract infection, site not specified: Secondary | ICD-10-CM | POA: Diagnosis present

## 2019-02-06 DIAGNOSIS — R569 Unspecified convulsions: Secondary | ICD-10-CM | POA: Diagnosis present

## 2019-02-06 DIAGNOSIS — Z79899 Other long term (current) drug therapy: Secondary | ICD-10-CM | POA: Diagnosis not present

## 2019-02-06 DIAGNOSIS — R6520 Severe sepsis without septic shock: Secondary | ICD-10-CM | POA: Diagnosis present

## 2019-02-06 DIAGNOSIS — E119 Type 2 diabetes mellitus without complications: Secondary | ICD-10-CM | POA: Diagnosis present

## 2019-02-06 DIAGNOSIS — F17200 Nicotine dependence, unspecified, uncomplicated: Secondary | ICD-10-CM | POA: Diagnosis present

## 2019-02-06 DIAGNOSIS — F319 Bipolar disorder, unspecified: Secondary | ICD-10-CM | POA: Diagnosis present

## 2019-02-06 DIAGNOSIS — Z833 Family history of diabetes mellitus: Secondary | ICD-10-CM | POA: Diagnosis not present

## 2019-02-06 DIAGNOSIS — E7800 Pure hypercholesterolemia, unspecified: Secondary | ICD-10-CM | POA: Diagnosis present

## 2019-02-06 DIAGNOSIS — G4730 Sleep apnea, unspecified: Secondary | ICD-10-CM | POA: Diagnosis present

## 2019-02-06 DIAGNOSIS — Z8616 Personal history of COVID-19: Secondary | ICD-10-CM | POA: Diagnosis not present

## 2019-02-06 DIAGNOSIS — E876 Hypokalemia: Secondary | ICD-10-CM | POA: Diagnosis present

## 2019-02-06 DIAGNOSIS — N3000 Acute cystitis without hematuria: Secondary | ICD-10-CM

## 2019-02-06 DIAGNOSIS — G309 Alzheimer's disease, unspecified: Secondary | ICD-10-CM | POA: Diagnosis present

## 2019-02-06 LAB — BASIC METABOLIC PANEL
Anion gap: 11 (ref 5–15)
BUN: 11 mg/dL (ref 8–23)
CO2: 25 mmol/L (ref 22–32)
Calcium: 8.6 mg/dL — ABNORMAL LOW (ref 8.9–10.3)
Chloride: 106 mmol/L (ref 98–111)
Creatinine, Ser: 0.54 mg/dL (ref 0.44–1.00)
GFR calc Af Amer: >60 mL/min (ref >60)
GFR calc non Af Amer: >60 mL/min (ref >60)
Glucose, Bld: 101 mg/dL — ABNORMAL HIGH (ref 70–99)
Potassium: 4.6 mmol/L (ref 3.5–5.1)
Sodium: 142 mmol/L (ref 135–145)

## 2019-02-06 LAB — CBC
HCT: 39.5 % (ref 36.0–46.0)
Hemoglobin: 12.8 g/dL (ref 12.0–15.0)
MCH: 29.2 pg (ref 26.0–34.0)
MCHC: 32.4 g/dL (ref 30.0–36.0)
MCV: 90.2 fL (ref 80.0–100.0)
Platelets: 255 10*3/uL (ref 150–400)
RBC: 4.38 MIL/uL (ref 3.87–5.11)
RDW: 13.6 % (ref 11.5–15.5)
WBC: 12 10*3/uL — ABNORMAL HIGH (ref 4.0–10.5)
nRBC: 0 % (ref 0.0–0.2)

## 2019-02-06 LAB — GLUCOSE, CAPILLARY
Glucose-Capillary: 90 mg/dL (ref 70–99)
Glucose-Capillary: 76 mg/dL (ref 70–99)
Glucose-Capillary: 100 mg/dL — ABNORMAL HIGH (ref 70–99)
Glucose-Capillary: 77 mg/dL (ref 70–99)

## 2019-02-06 LAB — HEMOGLOBIN A1C
Hgb A1c MFr Bld: 5.1 % (ref 4.8–5.6)
Mean Plasma Glucose: 99.67 mg/dL

## 2019-02-06 MED ORDER — LORATADINE 10 MG PO TABS
10.0000 mg | ORAL_TABLET | Freq: Every day | ORAL | Status: DC
  Administered 2019-02-07 – 2019-02-09 (×3): 10 mg via ORAL
  Filled 2019-02-06 (×4): qty 1

## 2019-02-06 MED ORDER — ESCITALOPRAM OXALATE 10 MG PO TABS
10.0000 mg | ORAL_TABLET | Freq: Every day | ORAL | Status: DC
  Administered 2019-02-07 – 2019-02-09 (×3): 10 mg via ORAL
  Filled 2019-02-06 (×4): qty 1

## 2019-02-06 MED ORDER — SACCHAROMYCES BOULARDII 250 MG PO CAPS
250.0000 mg | ORAL_CAPSULE | Freq: Every day | ORAL | Status: DC
  Administered 2019-02-07 – 2019-02-09 (×3): 250 mg via ORAL
  Filled 2019-02-06 (×4): qty 1

## 2019-02-06 MED ORDER — LACTATED RINGERS IV SOLN: INTRAVENOUS | Status: AC

## 2019-02-06 MED ORDER — MEMANTINE HCL ER 28 MG PO CP24
28.0000 mg | ORAL_CAPSULE | Freq: Every day | ORAL | Status: DC
  Administered 2019-02-07 – 2019-02-09 (×3): 28 mg via ORAL
  Filled 2019-02-06 (×4): qty 1

## 2019-02-06 MED ORDER — DIVALPROEX SODIUM 250 MG PO DR TAB
500.0000 mg | DELAYED_RELEASE_TABLET | Freq: Two times a day (BID) | ORAL | Status: DC
  Administered 2019-02-06 – 2019-02-09 (×6): 500 mg via ORAL
  Filled 2019-02-06 (×7): qty 2

## 2019-02-06 NOTE — Progress Notes (Signed)
This shift pt pended with MEWS 6. Spoke with Brett Fairy and ED  Nurse of unit MEWS requirements before transfer to the unit. Writer requested vitals to be updated and MEWS review prior to approval of transfer.After review of updated vitals OK to transfer.

## 2019-02-06 NOTE — Evaluation (Signed)
Clinical/Bedside Swallow Evaluation Patient Details  Name: Barbara Gill MRN: [id number] Date of Birth: [birth year]  Today's Date: 02/06/2019 Time: SLP Start Time (ACUTE ONLY): 20 SLP Stop Time (ACUTE ONLY): 1640 SLP Time Calculation (min) (ACUTE ONLY): 39 min  Past Medical History:  Past Medical History:  Diagnosis Date  . Alzheimer's disease ([place])   . High cholesterol   . Hyperlipemia   . Hypertension    Past Surgical History: No past surgical history on file. HPI:  64 y.o. female with medical history significant of advanced Alzheimer's dementia (nonverbal at baseline), hypertension, bipolar disordersent to ED from SNF in concern for AMS, fever and  malodorous urine.Patient was admitted with COVID-19 infection back in December, from 01/01/2019 through 01/13/2019.  She initially did have acute respiratory failure which resolved prior to discharge.  Patient also had E. coli UTI during that admission but was asymptomatic per records and only received single dose fosfomycin on 12/8.  Review of culture from that time grew pansensitive E. coli, negative ESBL   Assessment / Plan / Recommendation Clinical Impression  Patient presents with cognitive based dysphagia without focal CN deficits.  She did not form adequate suction on straw.  Positioning was suboptimal due to pt being on a low bed preventing adequate HOB elevation.  Pt also with neck extension upward which opens her airway and will increase her aspiration risk most notably with thinner liquids.  Pt is easily distracted and tends to looks around constantly but use of touching spoon or edge of cup to lip aided her opening her mouth to accept po.  No indication of aspiration but pt does demonstrates decreased acceptance of intake and delayed swallow. Her mastication of solids was slow but adequate without residuals.   Recommend dys3/nectar and allow thin water between meals after oral care.  This diet recommentation is temporary until pt  improves medically.  Wiill follow up to assure tolerance. SLP Visit Diagnosis: Dysphagia, oral phase (R13.11)    Aspiration Risk  Moderate aspiration risk;Risk for inadequate nutrition/hydration(do not anticipate pt to  have adequate intake at this time)    Diet Recommendation Dysphagia 3 (Mech soft);Nectar-thick liquid;Other (Comment);Free water protocol after oral care   Liquid Administration via: Cup;Spoon Medication Administration: Crushed with puree Supervision: Staff to assist with self feeding;Full supervision/cueing for compensatory strategies Compensations: Slow rate;Small sips/bites Postural Changes: Remain upright for at least 30 minutes after po intake;Seated upright at 90 degrees    Other  Recommendations Oral Care Recommendations: Oral care BID Other Recommendations: Order thickener from pharmacy   Follow up Recommendations None;24 hour supervision/assistance      Frequency and Duration min 2x/week  1 week       Prognosis Prognosis for Safe Diet Advancement: Fair Barriers to Reach Goals: Cognitive deficits;Motivation      Swallow Study   General Date of Onset: 02/06/19 HPI: 64 y.o. female with medical history significant of advanced Alzheimer's dementia (nonverbal at baseline), hypertension, bipolar disordersent to ED from SNF in concern for AMS, fever and  malodorous urine.Patient was admitted with COVID-19 infection back in December, from 01/01/2019 through 01/13/2019.  She initially did have acute respiratory failure which resolved prior to discharge.  Patient also had E. coli UTI during that admission but was asymptomatic per records and only received single dose fosfomycin on 12/8.  Review of culture from that time grew pansensitive E. coli, negative ESBL Type of Study: Bedside Swallow Evaluation Temperature Spikes Noted: Yes Respiratory Status: Room air History of Recent Intubation: No  Behavior/Cognition: Alert;Doesn't follow directions Oral Cavity Assessment:  Within Functional Limits Oral Care Completed by SLP: No Oral Cavity - Dentition: Adequate natural dentition Self-Feeding Abilities: Total assist(attempts to help hold cup) Patient Positioning: Upright in bed Baseline Vocal Quality: Normal Volitional Cough: Cognitively unable to elicit Volitional Swallow: Unable to elicit    Oral/Motor/Sensory Function Overall Oral Motor/Sensory Function: Generalized oral weakness(no focal cn deficits, pt did not consistently follow directions)   Ice Chips Ice chips: Not tested   Thin Liquid Thin Liquid: Impaired Presentation: Cup;Spoon;Straw Oral Phase Functional Implications: Prolonged oral transit Pharyngeal  Phase Impairments: Suspected delayed Swallow Other Comments: pt did not form suction on straw    Nectar Thick Nectar Thick Liquid: Impaired Presentation: Cup;Spoon Oral phase functional implications: Prolonged oral transit Pharyngeal Phase Impairments: Suspected delayed Swallow   Honey Thick Honey Thick Liquid: Not tested   Puree Puree: Impaired Presentation: Spoon Oral Phase Functional Implications: Prolonged oral transit Pharyngeal Phase Impairments: Suspected delayed Swallow   Solid     Solid: Impaired Oral Phase Impairments: Impaired mastication Oral Phase Functional Implications: Prolonged oral transit;Impaired mastication Pharyngeal Phase Impairments: Suspected delayed Swallow      Macario Golds 02/06/2019,6:40 PM    Kathleen Lime, MS [place] [facility] [phone]

## 2019-02-06 NOTE — Progress Notes (Signed)
   Vital Signs MEWS/VS Documentation      02/06/2019 0513 02/06/2019 0824 02/06/2019 0918 02/06/2019 0918   MEWS Score:  3  1  1  1    MEWS Score Color:  Yellow  Green  Barbara Gill  Green   Resp:  (!) 24  --  --  (!) 22   Pulse:  86  --  --  85   BP:  (!) 145/88  --  --  119/90   Temp:  99.4 F (37.4 C)  --  --  99.1 F (37.3 C)   O2 Device:  Room Air  --  --  Room Air   Level of Consciousness:  --  Alert  Alert  --       Patient more alert at this time.  Will continue to monitor for any further needed interventions.    Barbara Gill 02/06/2019,1:45 PM

## 2019-02-06 NOTE — Progress Notes (Addendum)
   02/06/19 0113  Vitals  Temp 98.3 F (36.8 C)  Temp Source Axillary  BP 109/84  MAP (mmHg) 93  BP Location Left Arm  BP Method Automatic  Patient Position (if appropriate) Lying  Pulse Rate 95  Pulse Rate Source Monitor  Resp (!) 24  Oxygen Therapy  SpO2 100 %  O2 Device Room Air   Patient transferred from ED, MD and charge nurse aware of patient's MEWs score before transfer. Protocol started and vitals will be monitored. Note made in charge that this was not an acute change. Dawson Bills, RN

## 2019-02-06 NOTE — Progress Notes (Signed)
PROGRESS NOTE    Barbara Gill  [id number]  DOB: 03/22/1955  PCP: Marton Redwood, MD Admit date:02/05/2019  64 y.o. female with medical history significant of advanced Alzheimer's dementia (nonverbal at baseline), hypertension, bipolar disordersent to ED from SNF in concern for AMS, fever and  malodorous urine.Patient was admitted with COVID-19 infection back in December, from 01/01/2019 through 01/13/2019.  She initially did have acute respiratory failure which resolved prior to discharge.  Patient also had E. coli UTI during that admission but was asymptomatic per records and only received single dose fosfomycin on 12/8.  Review of culture from that time grew pansensitive E. coli, negative ESBL ED Course: Febrile 101.7 F, tachycardic 113 O2 sat 93% on room air, normotensive.  Labs notable for ALT 84, lactic acid 2.6, leukocytosis 14.4k (neutrophil predominant).  UA showed cloudy urine, with many bacteria, 11-20 WBC, and trace leukocytes.  Urine and blood cultures obtained.  POC COVID-19 test negative.  CT head without acute findings.  Chest x-ray also negative.   Hospital course: Patient was treated for UTI with Rocephin and admitted as obs to hospitalist service for further management. Subjective:  Patient appears more awake today per nurse. Nurse tech concerned that patient is not safe to swallow as she is not able to follow commands/sip from a straw  Objective: Vitals:   02/06/19 0113 02/06/19 0320 02/06/19 0513 02/06/19 0918  BP: 109/84 121/80 (!) 145/88 119/90  Pulse: 95 89 86 85  Resp: (!) 24 (!) 24 (!) 24 (!) 22  Temp: 98.3 F (36.8 C) 99.2 F (37.3 C) 99.4 F (37.4 C) 99.1 F (37.3 C)  TempSrc: Axillary   Oral  SpO2: 100% 97% 98% 95%    Intake/Output Summary (Last 24 hours) at 02/06/2019 0958 Last data filed at 02/06/2019 0646 Gross per 24 hour  Intake 329.47 ml  Output 50 ml  Net 279.47 ml   There were no vitals filed for this visit.  Physical  Examination:  General exam: Appears calm and comfortable  Respiratory system: Clear to auscultation. Respiratory effort normal. Cardiovascular system: S1 & S2 heard, RRR. No JVD, murmurs, rubs, gallops or clicks. No pedal edema. Gastrointestinal system: Abdomen is nondistended, soft and nontender. Normal bowel sounds heard. Central nervous system: Alert and oriented. No new focal neurological deficits. Extremities: No contractures, edema or joint deformities.  Skin: No rashes, lesions or ulcers Psychiatry: Judgement and insight appear normal. Mood & affect appropriate.   Data Reviewed: I have personally reviewed following labs and imaging studies  CBC: Recent Labs  Lab 02/02/19 0938 02/05/19 1913 02/06/19 0601  WBC 6.9 14.4* 12.0*  NEUTROABS 4.2 11.6*  --   HGB 13.6 15.9* 12.8  HCT 43.2 50.2* 39.5  MCV 91.9 92.1 90.2  PLT 229 377 [id number]   Basic Metabolic Panel: Recent Labs  Lab 02/02/19 0938 02/05/19 1913 02/06/19 0601  NA 144 141 142  K 3.9 3.8 4.6  CL 107 99 106  CO2 28 28 25   GLUCOSE 105* 142* 101*  BUN 10 13 11   CREATININE 0.72 0.75 0.54  CALCIUM 9.1 9.9 8.6*  MG 2.0  --   --    GFR: Estimated Creatinine Clearance: 74.5 mL/min (by C-G formula based on SCr of 0.54 mg/dL). Liver Function Tests: Recent Labs  Lab 02/02/19 0938 02/05/19 1913  AST 23 40  ALT 75* 84*  ALKPHOS 56 67  BILITOT 0.6 0.7  PROT 6.3* 8.2*  ALBUMIN 3.2* 4.2   Recent Labs  Lab 02/05/19 1913  LIPASE 17   Recent Labs  Lab 02/02/19 1013  AMMONIA 29   Coagulation Profile: Recent Labs  Lab 02/05/19 1913  INR 1.0   Cardiac Enzymes: No results for input(s): CKTOTAL, CKMB, CKMBINDEX, TROPONINI in the last 168 hours. BNP (last 3 results) No results for input(s): PROBNP in the last 8760 hours. HbA1C: Recent Labs    02/05/19 2234  HGBA1C 5.1   CBG: Recent Labs  Lab 02/05/19 1858 02/06/19 0819  GLUCAP 141* 90   Lipid Profile: No results for input(s): CHOL, HDL, LDLCALC,  TRIG, CHOLHDL, LDLDIRECT in the last 72 hours. Thyroid Function Tests: No results for input(s): TSH, T4TOTAL, FREET4, T3FREE, THYROIDAB in the last 72 hours. Anemia Panel: No results for input(s): VITAMINB12, FOLATE, FERRITIN, TIBC, IRON, RETICCTPCT in the last 72 hours. Sepsis Labs: Recent Labs  Lab 02/05/19 1913 02/05/19 2056  LATICACIDVEN 2.6* 2.3*    Recent Results (from the past 240 hour(s))  SARS CORONAVIRUS 2 (TAT 6-24 HRS) Nasopharyngeal Nasopharyngeal Swab     Status: None   Collection Time: 02/02/19  6:50 PM   Specimen: Nasopharyngeal Swab  Result Value Ref Range Status   SARS Coronavirus 2 NEGATIVE NEGATIVE Final    Comment: (NOTE) SARS-CoV-2 target nucleic acids are NOT DETECTED. The SARS-CoV-2 RNA is generally detectable in upper and lower respiratory specimens during the acute phase of infection. Negative results do not preclude SARS-CoV-2 infection, do not rule out co-infections with other pathogens, and should not be used as the sole basis for treatment or other patient management decisions. Negative results must be combined with clinical observations, patient history, and epidemiological information. The expected result is Negative. Fact Sheet for Patients: SugarRoll.be Fact Sheet for Healthcare Providers: https://www.woods-mathews.com/ This test is not yet approved or cleared by the [place] FDA and  has been authorized for detection and/or diagnosis of SARS-CoV-2 by FDA under an Emergency Use Authorization (EUA). This EUA will remain  in effect (meaning this test can be used) for the duration of the COVID-19 declaration under Section 56 4(b)(1) of the Act, 21 U.S.C. section 360bbb-3(b)(1), unless the authorization is terminated or revoked sooner. Performed at [facility], [place] [address], [place], [place] [address]   Blood Culture (routine x 2)     Status: None (Preliminary result)   Collection Time:  02/05/19  7:01 PM   Specimen: BLOOD  Result Value Ref Range Status   Specimen Description   Final    BLOOD RIGHT ANTECUBITAL Performed at [facility], [place] [address], [place], [place] [address]    Special Requests   Final    BOTTLES DRAWN AEROBIC AND ANAEROBIC Blood Culture adequate volume Performed at [facility] [address], [place], [place] [address]    Culture PENDING  Incomplete   Report Status PENDING  Incomplete      Radiology Studies: CT Head Wo Contrast  Result Date: 02/05/2019 CLINICAL DATA:  Early-onset dementia. Mental status change. Malodorous urine. EXAM: CT HEAD WITHOUT CONTRAST TECHNIQUE: Contiguous axial images were obtained from the base of the skull through the vertex without intravenous contrast. COMPARISON:  February 02, 2019 FINDINGS: Brain: No subdural, epidural, or subarachnoid hemorrhage. Ventricles and sulci are prominent but stable. Cerebellum, brainstem, and basal cisterns are normal. No mass effect or midline shift. White matter changes again noted. No acute cortical ischemia or infarct. Vascular: No hyperdense vessel or unexpected calcification. Skull: Normal. Negative for fracture or focal lesion. Sinuses/Orbits: No acute finding. Other: None. IMPRESSION: No  acute intracranial abnormalities or changes. Electronically Signed   By: Dorise Bullion III M.D   On: 02/05/2019 19:43   DG Chest Port 1 View  Result Date: 02/05/2019 CLINICAL DATA:  64 year old female with altered mental status and fever. History of dementia. EXAM: PORTABLE CHEST 1 VIEW COMPARISON:  Portable chest 02/02/2019 and earlier. FINDINGS: Portable AP semi upright view at 1922 hours. Lower lung volumes. Normal cardiac size and mediastinal contours. Visualized tracheal air column is within normal limits. Allowing for portable technique the lungs remain clear. Paucity of bowel gas in the upper abdomen. No acute osseous abnormality identified. IMPRESSION: Lower lung  volumes.  No acute cardiopulmonary abnormality. Electronically Signed   By: Genevie Ann M.D.   On: 02/05/2019 19:38        Scheduled Meds: . divalproex  500 mg Oral BID  . enoxaparin (LOVENOX) injection  40 mg Subcutaneous Q24H  . escitalopram  10 mg Oral Daily  . insulin aspart  0-9 Units Subcutaneous TID WC  . loratadine  10 mg Oral Daily  . memantine  28 mg Oral Daily  . saccharomyces boulardii  250 mg Oral Daily   Continuous Infusions: . cefTRIAXone (ROCEPHIN)  IV    . lactated ringers 75 mL/hr at 02/06/19 0135    Assessment & Plan:   Acute lower UTI with Metabolic encephalopathy and meeting SIRS criteria with fever and tachycardia, altered mental status, lactate 2.6->2.3. Treated with Rocephin and LR bolus in the ED.  Prior urine culture grew pansensitive E. coli in December 2020. -Continue Rocephin for now-Follow-up urine culture. Continue IVF until cleared by sppech and able to take PO.   Acute metabolic encephalopathy -superimposed on advanced Alzheimer's dementia. Per nursing home and husband's report, patient has been less interactive than at baseline.  Suspect this is secondary to UTI.Monitor for improvement with treatment as above  Type 2 Diabetes ([place]) -does not appear to be on medications outpatient.  No A1c on file.  Glucose on metabolic panel [id number]. --Sensitive sliding scale NovoLog with meals --Follow-up A1c, pending  Alzheimer's disease ([place]) -advanced, nonverbal at baseline -Continue Namenda  Depression, Bipolar Disorder -chronic, appears stable -Continue Lexapro, Depakote  DVT prophylaxis: Lovenox Code Status: DNR -ED physician confirmed with patient's husband prior to admission Family / Patient Communication: Family not at bedside. Husband is guardian. D/W bedside nurse  Disposition Plan: Back to SNF when medically cleared. Awaiting urine cx/sensitivity, improvement in mental status, ability to tolerate po     LOS: 0 days    Time spent:      Guilford Shi, MD [facility] Pager [phone]  If 7PM-7AM, please contact night-coverage [email or web address] Password [facility] 02/06/2019, 9:58 AM

## 2019-02-07 ENCOUNTER — Encounter (HOSPITAL_COMMUNITY): Payer: Self-pay | Admitting: Internal Medicine

## 2019-02-07 ENCOUNTER — Other Ambulatory Visit: Payer: Self-pay

## 2019-02-07 DIAGNOSIS — G9340 Encephalopathy, unspecified: Secondary | ICD-10-CM

## 2019-02-07 DIAGNOSIS — F0280 Dementia in other diseases classified elsewhere without behavioral disturbance: Secondary | ICD-10-CM

## 2019-02-07 DIAGNOSIS — G300 Alzheimer's disease with early onset: Secondary | ICD-10-CM

## 2019-02-07 DIAGNOSIS — N390 Urinary tract infection, site not specified: Secondary | ICD-10-CM

## 2019-02-07 LAB — GLUCOSE, CAPILLARY
Glucose-Capillary: 83 mg/dL (ref 70–99)
Glucose-Capillary: 75 mg/dL (ref 70–99)
Glucose-Capillary: 67 mg/dL — ABNORMAL LOW (ref 70–99)
Glucose-Capillary: 69 mg/dL — ABNORMAL LOW (ref 70–99)
Glucose-Capillary: 82 mg/dL (ref 70–99)
Glucose-Capillary: 105 mg/dL — ABNORMAL HIGH (ref 70–99)

## 2019-02-07 MED ORDER — RESOURCE THICKENUP CLEAR PO POWD
ORAL | Status: DC | PRN
  Filled 2019-02-07: qty 125

## 2019-02-07 NOTE — Progress Notes (Signed)
PROGRESS NOTE    Barbara Gill  [id number]  DOB: 07/26/1955  PCP: Marton Redwood, MD   HPI  64 y.o. female with medical history significant of advanced Alzheimer's dementia (nonverbal at baseline), hypertension, bipolar disorder, sent to ED from SNF in concern for AMS, fever and malodorous urine. Patient was admitted with COVID-19 infection back in December, from 01/01/2019 through 01/13/2019.  She initially did have acute respiratory failure which resolved prior to discharge. Patient also had E. coli UTI during that admission but was asymptomatic per records and only received single dose fosfomycin on 12/8.  Review of culture from that time grew pansensitive E. coli, negative ESBL. In the ED, pt febrile 101.7 F, tachycardic 113, O2 sat 93% on room air, normotensive.  Labs notable for lactic acid 2.6, leukocytosis 14.4k. UA showed many bacteria, 11-20 WBC, and trace leukocytes. POC COVID-19 test negative.  CT head without acute findings.  Chest x-ray also negative. Patient admitted to hospitalist service for further management.    Subjective: Met patient sleeping comfortably, nonverbal at baseline.     Objective: Vitals:   02/06/19 2145 02/07/19 0000 02/07/19 0450 02/07/19 0750  BP: (!) 121/95  120/80   Pulse: (!) 104 88 91   Resp: 20  16   Temp: 100 F (37.8 C)  99.7 F (37.6 C)   TempSrc: Oral  Oral   SpO2: 97% 91% 94%   Weight:    81 kg  Height:    '5\' 3"'$  (1.6 m)    Intake/Output Summary (Last 24 hours) at 02/07/2019 1529 Last data filed at 02/07/2019 1100 Gross per 24 hour  Intake 175 ml  Output 1650 ml  Net -1475 ml   Filed Weights   02/07/19 0750  Weight: 81 kg    Physical Examination:  General: NAD   Cardiovascular: S1, S2 present  Respiratory: CTAB  Abdomen: Soft, nontender, nondistended, bowel sounds present  Musculoskeletal: No bilateral pedal edema noted  Skin: Normal  Psychiatry:  Unable to assess, poor insight   Data Reviewed: I have  personally reviewed following labs and imaging studies  CBC: Recent Labs  Lab 02/02/19 0938 02/05/19 1913 02/06/19 0601  WBC 6.9 14.4* 12.0*  NEUTROABS 4.2 11.6*  --   HGB 13.6 15.9* 12.8  HCT 43.2 50.2* 39.5  MCV 91.9 92.1 90.2  PLT 229 377 697   Basic Metabolic Panel: Recent Labs  Lab 02/02/19 0938 02/05/19 1913 02/06/19 0601  NA 144 141 142  K 3.9 3.8 4.6  CL 107 99 106  CO2 '28 28 25  '$ GLUCOSE 105* 142* 101*  BUN '10 13 11  '$ CREATININE 0.72 0.75 0.54  CALCIUM 9.1 9.9 8.6*  MG 2.0  --   --    GFR: Estimated Creatinine Clearance: 74.4 mL/min (by C-G formula based on SCr of 0.54 mg/dL). Liver Function Tests: Recent Labs  Lab 02/02/19 0938 02/05/19 1913  AST 23 40  ALT 75* 84*  ALKPHOS 56 67  BILITOT 0.6 0.7  PROT 6.3* 8.2*  ALBUMIN 3.2* 4.2   Recent Labs  Lab 02/05/19 1913  LIPASE 17   Recent Labs  Lab 02/02/19 1013  AMMONIA 29   Coagulation Profile: Recent Labs  Lab 02/05/19 1913  INR 1.0   Cardiac Enzymes: No results for input(s): CKTOTAL, CKMB, CKMBINDEX, TROPONINI in the last 168 hours. BNP (last 3 results) No results for input(s): PROBNP in the last 8760 hours. HbA1C: Recent Labs    02/05/19 2234  HGBA1C 5.1   CBG: Recent  Labs  Lab 02/06/19 1147 02/06/19 1716 02/06/19 2148 02/07/19 0721 02/07/19 1143  GLUCAP 76 100* 77 83 75   Lipid Profile: No results for input(s): CHOL, HDL, LDLCALC, TRIG, CHOLHDL, LDLDIRECT in the last 72 hours. Thyroid Function Tests: No results for input(s): TSH, T4TOTAL, FREET4, T3FREE, THYROIDAB in the last 72 hours. Anemia Panel: No results for input(s): VITAMINB12, FOLATE, FERRITIN, TIBC, IRON, RETICCTPCT in the last 72 hours. Sepsis Labs: Recent Labs  Lab 02/05/19 1913 02/05/19 2056  LATICACIDVEN 2.6* 2.3*    Recent Results (from the past 240 hour(s))  SARS CORONAVIRUS 2 (TAT 6-24 HRS) Nasopharyngeal Nasopharyngeal Swab     Status: None   Collection Time: 02/02/19  6:50 PM   Specimen:  Nasopharyngeal Swab  Result Value Ref Range Status   SARS Coronavirus 2 NEGATIVE NEGATIVE Final    Comment: (NOTE) SARS-CoV-2 target nucleic acids are NOT DETECTED. The SARS-CoV-2 RNA is generally detectable in upper and lower respiratory specimens during the acute phase of infection. Negative results do not preclude SARS-CoV-2 infection, do not rule out co-infections with other pathogens, and should not be used as the sole basis for treatment or other patient management decisions. Negative results must be combined with clinical observations, patient history, and epidemiological information. The expected result is Negative. Fact Sheet for Patients: SugarRoll.be Fact Sheet for Healthcare Providers: https://www.woods-mathews.com/ This test is not yet approved or cleared by the [place] FDA and  has been authorized for detection and/or diagnosis of SARS-CoV-2 by FDA under an Emergency Use Authorization (EUA). This EUA will remain  in effect (meaning this test can be used) for the duration of the COVID-19 declaration under Section 56 4(b)(1) of the Act, 21 U.S.C. section 360bbb-3(b)(1), unless the authorization is terminated or revoked sooner. Performed at [facility], [place] [address], [place], [place] [address]   Urine culture     Status: Abnormal (Preliminary result)   Collection Time: 02/05/19  6:56 PM   Specimen: In/Out Cath Urine  Result Value Ref Range Status   Specimen Description   Final    IN/OUT CATH URINE Performed at [facility] [address], [place], [place] [address]    Special Requests   Final    NONE Performed at [facility], [place] [address], [place], [place] [address]    Culture (A)  Final    >=100,000 COLONIES/mL KLEBSIELLA PNEUMONIAE CULTURE REINCUBATED FOR BETTER GROWTH Performed at [facility], [place] [address] [place],  [address]    Report Status  PENDING  Incomplete   Organism ID, Bacteria KLEBSIELLA PNEUMONIAE (A)  Final      Susceptibility   Klebsiella pneumoniae - MIC*    AMPICILLIN >=32 RESISTANT Resistant     CEFAZOLIN <=4 SENSITIVE Sensitive     CEFTRIAXONE <=0.25 SENSITIVE Sensitive     CIPROFLOXACIN <=0.25 SENSITIVE Sensitive     GENTAMICIN <=1 SENSITIVE Sensitive     IMIPENEM <=0.25 SENSITIVE Sensitive     NITROFURANTOIN 64 INTERMEDIATE Intermediate     TRIMETH/SULFA <=20 SENSITIVE Sensitive     AMPICILLIN/SULBACTAM 4 SENSITIVE Sensitive     PIP/TAZO <=4 SENSITIVE Sensitive     * >=100,000 COLONIES/mL KLEBSIELLA PNEUMONIAE  Blood Culture (routine x 2)     Status: None (Preliminary result)   Collection Time: 02/05/19  7:01 PM   Specimen: BLOOD  Result Value Ref Range Status   Specimen Description   Final    BLOOD RIGHT ANTECUBITAL Performed at [facility],  [address], [place], [place] [address]    Special Requests   Final    BOTTLES DRAWN AEROBIC AND ANAEROBIC Blood Culture adequate volume Performed at [facility] [address] [place], [place] [address]    Culture   Final    NO GROWTH 1 DAY Performed at [facility], [place] [address] [place], [place] [address]    Report Status PENDING  Incomplete      Radiology Studies: CT Head Wo Contrast  Result Date: 02/05/2019 CLINICAL DATA:  Early-onset dementia. Mental status change. Malodorous urine. EXAM: CT HEAD WITHOUT CONTRAST TECHNIQUE: Contiguous axial images were obtained from the base of the skull through the vertex without intravenous contrast. COMPARISON:  February 02, 2019 FINDINGS: Brain: No subdural, epidural, or subarachnoid hemorrhage. Ventricles and sulci are prominent but stable. Cerebellum, brainstem, and basal cisterns are normal. No mass effect or midline shift. White matter changes again noted. No acute cortical ischemia or infarct. Vascular: No hyperdense vessel or unexpected calcification. Skull: Normal. Negative  for fracture or focal lesion. Sinuses/Orbits: No acute finding. Other: None. IMPRESSION: No acute intracranial abnormalities or changes. Electronically Signed   By: Dorise Bullion III M.D   On: 02/05/2019 19:43   DG Chest Port 1 View  Result Date: 02/05/2019 CLINICAL DATA:  64 year old female with altered mental status and fever. History of dementia. EXAM: PORTABLE CHEST 1 VIEW COMPARISON:  Portable chest 02/02/2019 and earlier. FINDINGS: Portable AP semi upright view at 1922 hours. Lower lung volumes. Normal cardiac size and mediastinal contours. Visualized tracheal air column is within normal limits. Allowing for portable technique the lungs remain clear. Paucity of bowel gas in the upper abdomen. No acute osseous abnormality identified. IMPRESSION: Lower lung volumes.  No acute cardiopulmonary abnormality. Electronically Signed   By: Genevie Ann M.D.   On: 02/05/2019 19:38        Scheduled Meds: . divalproex  500 mg Oral BID  . enoxaparin (LOVENOX) injection  40 mg Subcutaneous Q24H  . escitalopram  10 mg Oral Daily  . insulin aspart  0-9 Units Subcutaneous TID WC  . loratadine  10 mg Oral Daily  . memantine  28 mg Oral Daily  . saccharomyces boulardii  250 mg Oral Daily   Continuous Infusions: . cefTRIAXone (ROCEPHIN)  IV Stopped (02/06/19 1821)    Assessment & Plan:   Sepsis likely 2/2 acute lower Klebsiella pneumoniae UTI Fever, tachycardia, lactate 2.6->2.3 on presentation, repeat LA in the a.m. Currently afebrile, with leukocytosis UA positive for UTI UC grew Klebsiella pneumonia, sensitive to ceftriaxone Continue ceftriaxone for another day, and may de-escalate to p.o. Keflex on 02/08/2019 for total of 5 days Monitor closely  Acute metabolic encephalopathy Likely 2/2 above Continue management as above  ??Type 2 Diabetes mellitus  CBG between 72-90 A1c 5.1 Hypoglycemic protocol, Accu-Cheks DC SSI  Alzheimer's disease Advanced, nonverbal at baseline Delirium  precautions Continue Namenda  Depression, Bipolar Disorder  Continue Lexapro, Depakote     DVT prophylaxis: Lovenox Code Status: DNR -ED physician confirmed with patient's husband prior to admission Family / Patient Communication: None at bedside Disposition Plan: Back to SNF when medically cleared      LOS: 1 day    Time spent:     Alma Friendly, MD [facility]  If 7PM-7AM, please contact night-coverage [email or web address] 02/07/2019, 3:29 PM

## 2019-02-07 NOTE — Evaluation (Signed)
Physical Therapy Evaluation-1x Patient Details Name: Barbara Gill MRN: [id number] DOB: 1956-01-16 Today's Date: 02/07/2019   History of Present Illness  64 yo female admitted with UTI. Hx of Advanced dementia. OSA, primary progressive aphasia, chronic depression.  Clinical Impression  Limited eval. Pt is unable to follow commands. She cannot participate meaningfully with PT. 1x eval. Recommend return to facility. Will sign off.     Follow Up Recommendations (return to facility)    Equipment Recommendations  None recommended by PT    Recommendations for Other Services       Precautions / Restrictions Precautions Precautions: Fall Restrictions Weight Bearing Restrictions: No      Mobility  Bed Mobility               General bed mobility comments: NT-pt not following commands  Transfers                    Ambulation/Gait                Stairs            Wheelchair Mobility    Modified Rankin (Stroke Patients Only)       Balance                                             Pertinent Vitals/Pain Pain Assessment: Faces Faces Pain Scale: No hurt    Home Living Family/patient expects to be discharged to:: Unsure                      Prior Function Level of Independence: Needs assistance   Gait / Transfers Assistance Needed: unsure of PLOF-pt was requiring Total +2 assist for mobility as of early Dec  ADL's / Homemaking Assistance Needed: likely requires assistance        Hand Dominance        Extremity/Trunk Assessment   Upper Extremity Assessment Upper Extremity Assessment: Difficult to assess due to impaired cognition    Lower Extremity Assessment Lower Extremity Assessment: Difficult to assess due to impaired cognition       Communication   Communication: Expressive difficulties  Cognition Arousal/Alertness: Awake/alert Behavior During Therapy: WFL for tasks  assessed/performed Overall Cognitive Status: History of cognitive impairments - at baseline                                        General Comments      Exercises     Assessment/Plan    PT Assessment Patent does not need any further PT services  PT Problem List         PT Treatment Interventions      PT Goals (Current goals can be found in the Care Plan section)  Acute Rehab PT Goals Patient Stated Goal: pt unable to state PT Goal Formulation: Patient unable to participate in goal setting    Frequency     Barriers to discharge        Co-evaluation               AM-PAC PT "6 Clicks" Mobility  Outcome Measure Help needed turning from your back to your side while in a flat bed without using bedrails?: Total Help needed moving from lying on your back  to sitting on the side of a flat bed without using bedrails?: Total Help needed moving to and from a bed to a chair (including a wheelchair)?: Total Help needed standing up from a chair using your arms (e.g., wheelchair or bedside chair)?: Total Help needed to walk in hospital room?: Total Help needed climbing 3-5 steps with a railing? : Total 6 Click Score: 6    End of Session   Activity Tolerance: (session limited by cognition) Patient left: in bed;with call bell/phone within reach;with bed alarm set        Time: 1422-1430 PT Time Calculation (min) (ACUTE ONLY): 8 min   Charges:   PT Evaluation $PT Eval Low Complexity: 1 Low            Imagene Boss P, PT Acute Rehabilitation

## 2019-02-07 NOTE — Progress Notes (Signed)
Hypoglycemic Event  CBG: 67  Treatment: 4 oz juice/soda and 8 oz juice/soda  Symptoms: None  Follow-up CBG: Time:1719 CBG Result:69 Follow up CBG time after another 4 oz of juice: 1750 Result: 82  Possible Reasons for Event: Inadequate meal intake  Comments/MD notified:    Barbara Gill D

## 2019-02-08 LAB — GLUCOSE, CAPILLARY
Glucose-Capillary: 76 mg/dL (ref 70–99)
Glucose-Capillary: 73 mg/dL (ref 70–99)
Glucose-Capillary: 80 mg/dL (ref 70–99)
Glucose-Capillary: 91 mg/dL (ref 70–99)

## 2019-02-08 LAB — CBC WITH DIFFERENTIAL/PLATELET
Abs Immature Granulocytes: 0.05 10*3/uL (ref 0.00–0.07)
Basophils Absolute: 0.1 10*3/uL (ref 0.0–0.1)
Basophils Relative: 1 %
Eosinophils Absolute: 0.4 10*3/uL (ref 0.0–0.5)
Eosinophils Relative: 5 %
HCT: 36.3 % (ref 36.0–46.0)
Hemoglobin: 11.3 g/dL — ABNORMAL LOW (ref 12.0–15.0)
Immature Granulocytes: 1 %
Lymphocytes Relative: 29 %
Lymphs Abs: 2.2 10*3/uL (ref 0.7–4.0)
MCH: 28.8 pg (ref 26.0–34.0)
MCHC: 31.1 g/dL (ref 30.0–36.0)
MCV: 92.6 fL (ref 80.0–100.0)
Monocytes Absolute: 0.8 10*3/uL (ref 0.1–1.0)
Monocytes Relative: 10 %
Neutro Abs: 4.3 10*3/uL (ref 1.7–7.7)
Neutrophils Relative %: 54 %
Platelets: 251 10*3/uL (ref 150–400)
RBC: 3.92 MIL/uL (ref 3.87–5.11)
RDW: 13.4 % (ref 11.5–15.5)
WBC: 7.8 10*3/uL (ref 4.0–10.5)
nRBC: 0 % (ref 0.0–0.2)

## 2019-02-08 LAB — BASIC METABOLIC PANEL
Anion gap: 9 (ref 5–15)
BUN: 8 mg/dL (ref 8–23)
CO2: 26 mmol/L (ref 22–32)
Calcium: 8.3 mg/dL — ABNORMAL LOW (ref 8.9–10.3)
Chloride: 104 mmol/L (ref 98–111)
Creatinine, Ser: 0.6 mg/dL (ref 0.44–1.00)
GFR calc Af Amer: >60 mL/min (ref >60)
GFR calc non Af Amer: >60 mL/min (ref >60)
Glucose, Bld: 83 mg/dL (ref 70–99)
Potassium: 3.4 mmol/L — ABNORMAL LOW (ref 3.5–5.1)
Sodium: 139 mmol/L (ref 135–145)

## 2019-02-08 LAB — LACTIC ACID, PLASMA: Lactic Acid, Venous: 0.8 mmol/L (ref 0.5–1.9)

## 2019-02-08 MED ORDER — POTASSIUM CHLORIDE CRYS ER 20 MEQ PO TBCR
20.0000 meq | EXTENDED_RELEASE_TABLET | Freq: Two times a day (BID) | ORAL | Status: DC
  Administered 2019-02-08 – 2019-02-09 (×3): 20 meq via ORAL
  Filled 2019-02-08 (×3): qty 1

## 2019-02-08 MED ORDER — CEFDINIR 300 MG PO CAPS
300.0000 mg | ORAL_CAPSULE | Freq: Two times a day (BID) | ORAL | Status: DC
  Administered 2019-02-09: 09:00:00 300 mg via ORAL
  Filled 2019-02-08 (×2): qty 1

## 2019-02-08 MED ORDER — SODIUM CHLORIDE 0.9 % IV SOLN
1.0000 g | Freq: Once | INTRAVENOUS | Status: AC
  Administered 2019-02-08: 17:00:00 1 g via INTRAVENOUS
  Filled 2019-02-08: qty 1

## 2019-02-08 NOTE — Plan of Care (Signed)

## 2019-02-08 NOTE — NC FL2 (Signed)
[name] LEVEL OF Gill SCREENING TOOL     IDENTIFICATION  Patient Name: Barbara Gill Birthdate: May 08, 1955 Sex: female Admission Date (Current Location): 02/05/2019  Barbara Gill and [place] Number:  Herbalist and Address:  Barbara Gill,  [place] [address] [place]      Provider Number: [id number]  Attending Physician Name and Address:  Barbara Merino, MD  Relative Name and Phone Number:       Current Level of Gill: Gill Recommended Level of Gill: Memory Gill Prior Approval Number:    Date Approved/Denied:   PASRR Number:    Discharge Plan: Domiciliary (Rest home)(Barbara Gill)    Current Diagnoses: Patient Active Problem List   Diagnosis Date Noted  . Metabolic encephalopathy [id number]  . Acute lower UTI 02/05/2019  . SIRS (systemic inflammatory response syndrome) (Barbara Gill) 02/05/2019  . COVID-19 virus infection 01/01/2019  . Acute encephalopathy 01/01/2019  . Hypotension 01/01/2019  . Hip tenderness, left 01/01/2019  . Sinus bradycardia 01/01/2019  . Diabetes (Barbara Gill) 11/08/2015  . Alzheimer's disease (Barbara Gill) 11/08/2015  . OSA (obstructive sleep apnea) 11/08/2015  . Nicotine dependence 11/08/2015  . Hyperlipemia 11/08/2015  . ADD (attention deficit disorder) 11/08/2015  . Generalized anxiety disorder 11/08/2015  . Depression 11/08/2015    Orientation RESPIRATION BLADDER Height & Weight     (Poor attention, unable to follow commands)  Normal Incontinent Weight: 81 kg Height:  5\' 3"  (160 cm)  BEHAVIORAL SYMPTOMS/MOOD NEUROLOGICAL BOWEL NUTRITION STATUS  (none) (None) Incontinent (DYS 3, Nectar thick)  AMBULATORY STATUS COMMUNICATION OF NEEDS Skin   Total Gill Does not communicate Normal                       Personal Gill Assistance Level of Assistance  Bathing, Feeding, Dressing Bathing Assistance: Maximum assistance Feeding assistance: Maximum assistance Dressing Assistance: Maximum  assistance     Functional Limitations Info  Sight, Hearing, Speech Sight Info: Adequate Hearing Info: Adequate Speech Info: Impaired(Non-communicative)    SPECIAL Gill FACTORS FREQUENCY                       Contractures Contractures Info: Not present    Additional Factors Info  Code Status, Allergies Code Status Info: DNR Allergies Info: Cholestatin, Poison Ivy Extract           Current Medications (02/08/2019):  This is the current Gill active medication list    Discharge Medications:  cefdinir 300 MG capsule Commonly known as: OMNICEF Take 1 capsule (300 mg total) by mouth every 12 (twelve) hours for 4 days.           divalproex 500 MG DR tablet Commonly known as: Depakote Take 1 tablet (500 mg total) by mouth 2 (two) times daily.          escitalopram 10 MG tablet Commonly known as: Lexapro Take 1 tablet (10 mg total) by mouth daily.          loratadine 10 MG tablet Commonly known as: CLARITIN Take 10 mg by mouth daily.          memantine 28 MG Cp24 24 hr capsule Commonly known as: Namenda XR Take 1 capsule (28 mg total) by mouth daily.          potassium chloride 10 MEQ tablet Commonly known as: KLOR-CON Take 1 tablet (10 mEq total) by mouth daily for 4 days.  saccharomyces boulardii 250 MG capsule Commonly known as: FLORASTOR Take 250 mg by mouth daily.             Relevant Imaging Results:  Relevant Lab Results:   Additional Information SSN: [id number]  Barbara Gill, [place]

## 2019-02-08 NOTE — Progress Notes (Signed)
PROGRESS NOTE    Barbara Gill  [id number]  DOB: May 29, 1955  PCP: Marton Redwood, MD   HPI  64 y.o. female with medical history significant of advanced Alzheimer's dementia (nonverbal at baseline), hypertension, bipolar disorder, sent to ED from SNF in concern for AMS, fever and malodorous urine. Patient was admitted with COVID-19 infection back in December, from 01/01/2019 through 01/13/2019.  She initially did have acute respiratory failure which resolved prior to discharge. Patient also had E. coli UTI during that admission but was asymptomatic per records and only received single dose fosfomycin on 12/8.  Review of culture from that time grew pansensitive E. coli, negative ESBL.  In the ED, pt febrile 101.7 F, tachycardic 113, O2 sat 93% on room air, normotensive.  Labs notable for lactic acid 2.6, leukocytosis 14.4k. UA showed many bacteria, 11-20 WBC, and trace leukocytes. POC COVID-19 test negative.  CT head without acute findings. Chest x-ray also negative. Patient admitted to hospitalist service for further management.    Subjective: Seen and examined.  Afebrile.  No overnight events.  Nursing reported that she is more awake and able to eat with support.    Objective: Vitals:   02/07/19 0750 02/07/19 1451 02/07/19 2042 02/08/19 0452  BP:  123/81 118/64 109/72  Pulse:  94 84 82  Resp:  20 16 20   Temp:  98.4 F (36.9 C) 100.2 F (37.9 C) 99.3 F (37.4 C)  TempSrc:  Axillary Oral Oral  SpO2:  94% 97% 95%  Weight: 81 kg     Height: 5\' 3"  (1.6 m)       Intake/Output Summary (Last 24 hours) at 02/08/2019 1300 Last data filed at 02/08/2019 [id number] Gross per 24 hour  Intake 625.79 ml  Output 1175 ml  Net -549.21 ml   Filed Weights   02/07/19 0750  Weight: 81 kg    Physical Examination:  General: NAD   Cardiovascular: S1, S2 present  Respiratory: CTAB  Abdomen: Soft, nontender, nondistended, bowel sounds present  Musculoskeletal: No bilateral pedal edema  noted  Skin: Normal  Psychiatry:  Alert but not oriented.   Data Reviewed: I have personally reviewed following labs and imaging studies  CBC: Recent Labs  Lab 02/02/19 0938 02/05/19 1913 02/06/19 0601 02/08/19 0620  WBC 6.9 14.4* 12.0* 7.8  NEUTROABS 4.2 11.6*  --  4.3  HGB 13.6 15.9* 12.8 11.3*  HCT 43.2 50.2* 39.5 36.3  MCV 91.9 92.1 90.2 92.6  PLT 229 377 255 [id number]   Basic Metabolic Panel: Recent Labs  Lab 02/02/19 0938 02/05/19 1913 02/06/19 0601 02/08/19 0620  NA 144 141 142 139  K 3.9 3.8 4.6 3.4*  CL 107 99 106 104  CO2 28 28 25 26   GLUCOSE 105* 142* 101* 83  BUN 10 13 11 8   CREATININE 0.72 0.75 0.54 0.60  CALCIUM 9.1 9.9 8.6* 8.3*  MG 2.0  --   --   --    GFR: Estimated Creatinine Clearance: 74.4 mL/min (by C-G formula based on SCr of 0.6 mg/dL). Liver Function Tests: Recent Labs  Lab 02/02/19 0938 02/05/19 1913  AST 23 40  ALT 75* 84*  ALKPHOS 56 67  BILITOT 0.6 0.7  PROT 6.3* 8.2*  ALBUMIN 3.2* 4.2   Recent Labs  Lab 02/05/19 1913  LIPASE 17   Recent Labs  Lab 02/02/19 1013  AMMONIA 29   Coagulation Profile: Recent Labs  Lab 02/05/19 1913  INR 1.0   Cardiac Enzymes: No results for input(s): CKTOTAL, CKMB,  CKMBINDEX, TROPONINI in the last 168 hours. BNP (last 3 results) No results for input(s): PROBNP in the last 8760 hours. HbA1C: Recent Labs    02/05/19 2234  HGBA1C 5.1   CBG: Recent Labs  Lab 02/07/19 1719 02/07/19 1750 02/07/19 2044 02/08/19 0746 02/08/19 1149  GLUCAP 69* 82 105* 76 73   Lipid Profile: No results for input(s): CHOL, HDL, LDLCALC, TRIG, CHOLHDL, LDLDIRECT in the last 72 hours. Thyroid Function Tests: No results for input(s): TSH, T4TOTAL, FREET4, T3FREE, THYROIDAB in the last 72 hours. Anemia Panel: No results for input(s): VITAMINB12, FOLATE, FERRITIN, TIBC, IRON, RETICCTPCT in the last 72 hours. Sepsis Labs: Recent Labs  Lab 02/05/19 1913 02/05/19 2056 02/08/19 0620  LATICACIDVEN 2.6*  2.3* 0.8    Recent Results (from the past 240 hour(s))  SARS CORONAVIRUS 2 (TAT 6-24 HRS) Nasopharyngeal Nasopharyngeal Swab     Status: None   Collection Time: 02/02/19  6:50 PM   Specimen: Nasopharyngeal Swab  Result Value Ref Range Status   SARS Coronavirus 2 NEGATIVE NEGATIVE Final    Comment: (NOTE) SARS-CoV-2 target nucleic acids are NOT DETECTED. The SARS-CoV-2 RNA is generally detectable in upper and lower respiratory specimens during the acute phase of infection. Negative results do not preclude SARS-CoV-2 infection, do not rule out co-infections with other pathogens, and should not be used as the sole basis for treatment or other patient management decisions. Negative results must be combined with clinical observations, patient history, and epidemiological information. The expected result is Negative. Fact Sheet for Patients: SugarRoll.be Fact Sheet for Healthcare Providers: https://www.woods-mathews.com/ This test is not yet approved or cleared by the [place] FDA and  has been authorized for detection and/or diagnosis of SARS-CoV-2 by FDA under an Emergency Use Authorization (EUA). This EUA will remain  in effect (meaning this test can be used) for the duration of the COVID-19 declaration under Section 56 4(b)(1) of the Act, 21 U.S.C. section 360bbb-3(b)(1), unless the authorization is terminated or revoked sooner. Performed at [facility], [place] [address], [place], [place] [address]   Urine culture     Status: Abnormal (Preliminary result)   Collection Time: 02/05/19  6:56 PM   Specimen: In/Out Cath Urine  Result Value Ref Range Status   Specimen Description   Final    IN/OUT CATH URINE Performed at [facility] [address] [place], [place] [address]    Special Requests   Final    NONE Performed at [facility], [place] [address] [place], [place] [address]    Culture  (A)  Final    >=100,000 COLONIES/mL KLEBSIELLA PNEUMONIAE CULTURE REINCUBATED FOR BETTER GROWTH Performed at [facility], [place] [address] [place], [place] [address]    Report Status PENDING  Incomplete   Organism ID, Bacteria KLEBSIELLA PNEUMONIAE (A)  Final      Susceptibility   Klebsiella pneumoniae - MIC*    AMPICILLIN >=32 RESISTANT Resistant     CEFAZOLIN <=4 SENSITIVE Sensitive     CEFTRIAXONE <=0.25 SENSITIVE Sensitive     CIPROFLOXACIN <=0.25 SENSITIVE Sensitive     GENTAMICIN <=1 SENSITIVE Sensitive     IMIPENEM <=0.25 SENSITIVE Sensitive     NITROFURANTOIN 64 INTERMEDIATE Intermediate     TRIMETH/SULFA <=20 SENSITIVE Sensitive     AMPICILLIN/SULBACTAM 4 SENSITIVE Sensitive     PIP/TAZO <=4 SENSITIVE Sensitive     * >=100,000 COLONIES/mL KLEBSIELLA PNEUMONIAE  Blood Culture (routine x 2)     Status: None (  Preliminary result)   Collection Time: 02/05/19  7:01 PM   Specimen: BLOOD  Result Value Ref Range Status   Specimen Description   Final    BLOOD RIGHT ANTECUBITAL Performed at [facility], [place] [address], [place], [place] [address]    Special Requests   Final    BOTTLES DRAWN AEROBIC AND ANAEROBIC Blood Culture adequate volume Performed at [facility] [address], [place],  [address]    Culture   Final    NO GROWTH 2 DAYS Performed at [facility] [address], [place],  [address]    Report Status PENDING  Incomplete      Radiology Studies: No results found.      Scheduled Meds: . [START ON 02/09/2019] cefdinir  300 mg Oral Q12H  . divalproex  500 mg Oral BID  . enoxaparin (LOVENOX) injection  40 mg Subcutaneous Q24H  . escitalopram  10 mg Oral Daily  . loratadine  10 mg Oral Daily  . memantine  28 mg Oral Daily  . potassium chloride  20 mEq Oral BID  . saccharomyces boulardii  250 mg Oral Daily   Continuous Infusions: . cefTRIAXone (ROCEPHIN)  IV      Assessment & Plan:   Sepsis  likely 2/2 acute lower Klebsiella pneumoniae UTI Sepsis present on admission improving. Urine culture and sensitive, on Rocephin day 3/3, will change to [facility] for 4 more days on discharge.  Acute metabolic encephalopathy with history of underlying advanced dementia. Likely 2/2 above Continue management as above, no focal deficit.  Alzheimer's disease Advanced, nonverbal at baseline Delirium precautions Continue Namenda Long-term nursing home resident.  Depression, Bipolar Disorder  Continue Lexapro, Depakote     DVT prophylaxis: Lovenox Code Status: DNR  Family / Patient Communication: Called and notified patient's husband.  Patient may probably go back to nursing home tomorrow. Disposition Plan: Back to SNF tomorrow morning.  COVID-19 test in anticipation of discharge.      LOS: 2 days    Time spent: 25 minutes     [name], MD [facility]

## 2019-02-09 LAB — GLUCOSE, CAPILLARY
Glucose-Capillary: 82 mg/dL (ref 70–99)
Glucose-Capillary: 88 mg/dL (ref 70–99)

## 2019-02-09 LAB — URINE CULTURE: Culture: >=100000 — AB

## 2019-02-09 MED ORDER — CEFDINIR 300 MG PO CAPS: 300.0000 mg | ORAL_CAPSULE | Freq: Two times a day (BID) | ORAL | 0 refills | Status: AC

## 2019-02-09 MED ORDER — POTASSIUM CHLORIDE CRYS ER 10 MEQ PO TBCR: 10.0000 meq | EXTENDED_RELEASE_TABLET | Freq: Every day | ORAL | 0 refills | Status: AC

## 2019-02-09 NOTE — Progress Notes (Signed)
Report called to [place] [facility] at this time and given to [place]

## 2019-02-09 NOTE — TOC Transition Note (Signed)
Transition of Care [facility]) - CM/SW Discharge Note   Patient Details  Name: Barbara Gill MRN: [id number] Date of Birth: 29-Mar-1955  [facility]) CM/SW Contact:  Trish Mage, LCSW Phone Number: 02/09/2019, 11:42 AM   Clinical Narrative:   Patient to return to [facility] today.  Required paperwork faxed to facility.  PTAR set up.  Nursing, please call report to [phone].  TOC sign off.    Final next level of care: Memory Care Barriers to Discharge: No Barriers Identified   Patient Goals and CMS Choice        Discharge Placement                       Discharge Plan and Services                                     Social Determinants of Health (SDOH) Interventions     Readmission Risk Interventions No flowsheet data found.

## 2019-02-09 NOTE — Discharge Instructions (Signed)
uti  Urinary Tract Infection, Adult A urinary tract infection (UTI) is an infection of any part of the urinary tract. The urinary tract includes:  The kidneys.  The ureters.  The bladder.  The urethra. These organs make, store, and get rid of pee (urine) in the body. What are the causes? This is caused by germs (bacteria) in your genital area. These germs grow and cause swelling (inflammation) of your urinary tract. What increases the risk? You are more likely to develop this condition if:  You have a small, thin tube (catheter) to drain pee.  You cannot control when you pee or poop (incontinence).  You are female, and: ? You use these methods to prevent pregnancy:  A medicine that kills sperm (spermicide).  A device that blocks sperm (diaphragm). ? You have low levels of a female hormone (estrogen). ? You are pregnant.  You have genes that add to your risk.  You are sexually active.  You take antibiotic medicines.  You have trouble peeing because of: ? A prostate that is bigger than normal, if you are female. ? A blockage in the part of your body that drains pee from the bladder (urethra). ? A kidney stone. ? A nerve condition that affects your bladder (neurogenic bladder). ? Not getting enough to drink. ? Not peeing often enough.  You have other conditions, such as: ? Diabetes. ? A weak disease-fighting system (immune system). ? Sickle cell disease. ? Gout. ? Injury of the spine. What are the signs or symptoms? Symptoms of this condition include:  Needing to pee right away (urgently).  Peeing often.  Peeing small amounts often.  Pain or burning when peeing.  Blood in the pee.  Pee that smells bad or not like normal.  Trouble peeing.  Pee that is cloudy.  Fluid coming from the vagina, if you are female.  Pain in the belly or lower back. Other symptoms include:  Throwing up (vomiting).  No urge to eat.  Feeling mixed up (confused).  Being  tired and grouchy (irritable).  A fever.  Watery poop (diarrhea). How is this treated? This condition may be treated with:  Antibiotic medicine.  Other medicines.  Drinking enough water. Follow these instructions at home:  Medicines  Take over-the-counter and prescription medicines only as told by your doctor.  If you were prescribed an antibiotic medicine, take it as told by your doctor. Do not stop taking it even if you start to feel better. General instructions  Make sure you: ? Pee until your bladder is empty. ? Do not hold pee for a long time. ? Empty your bladder after sex. ? Wipe from front to back after pooping if you are a female. Use each tissue one time when you wipe.  Drink enough fluid to keep your pee pale yellow.  Keep all follow-up visits as told by your doctor. This is important. Contact a doctor if:  You do not get better after 1-2 days.  Your symptoms go away and then come back. Get help right away if:  You have very bad back pain.  You have very bad pain in your lower belly.  You have a fever.  You are sick to your stomach (nauseous).  You are throwing up. Summary  A urinary tract infection (UTI) is an infection of any part of the urinary tract.  This condition is caused by germs in your genital area.  There are many risk factors for a UTI. These include having a  small, thin tube to drain pee and not being able to control when you pee or poop.  Treatment includes antibiotic medicines for germs.  Drink enough fluid to keep your pee pale yellow. This information is not intended to replace advice given to you by your health care provider. Make sure you discuss any questions you have with your health care provider. Document Revised: 12/30/2017 Document Reviewed: 07/22/2017 Elsevier Patient Education  2020 [employer].

## 2019-02-09 NOTE — Discharge Summary (Signed)
Physician Discharge Summary  Barbara Gill [id number] DOB: 1955/06/25 DOA: 02/05/2019  PCP: Marton Redwood, MD  Admit date: 02/05/2019 Discharge date: 02/09/2019  Admitted From: Assisted living facility Disposition: Assisted living facility  Recommendations for Outpatient Follow-up:  1. Follow up with PCP in 1-2 weeks 2. Please obtain BMP/CBC in one week   Home Health: Not applicable Equipment/Devices: Not applicable  Discharge Condition: Fair CODE STATUS: DNR Diet recommendation: Regular diet, mechanical soft and nectar thick liquids, all time aspiration precautions.  Assisted feeding.    Discharge summary: 64 y.o.femalewith medical history significant ofadvanced Alzheimer's dementia (nonverbal at baseline), hypertension, bipolar disorder, sent to ED from ALF with concern for AMS, fever and malodorous urine. Patient was admitted with COVID-19 infection back in December, from 01/01/2019 through 01/13/2019. She initially did have acute respiratory failure which resolved prior to discharge. Patient also had E. coli UTI during that admission but was asymptomatic per records and only received single dose fosfomycin on 12/8. Review of culture from that time grew pansensitive E. coli, negative ESBL.  In the ED, pt febrile 101.76F, tachycardic 113, O2 sat 93% on room air, normotensive. Labs notable for lactic acid 2.6, leukocytosis 14.4k. UA showed many bacteria, 11-20 WBC, and trace leukocytes. POC COVID-19 test negative. CT head without acute findings. Chest x-ray also negative.   # Sepsis likely 2/2 acute lower Klebsiella pneumoniae UTI Sepsis improved. Urine culture with Klebsiella and Aerococcus.  Received 3 days of Rocephin.  will change to [facility] for 4 more days on discharge.  # Acute metabolic encephalopathy with history of underlying advanced dementia. Likely 2/2 above, CT head negative.  No focal deficit. Continue management as above, no focal deficit. All-time fall  precautions.  # Alzheimer's disease: Advanced dementia.  Total care. Delirium precautions Continue [facility]-term assisted living facility resident.  # Depression, Bipolar Disorder: Continue Lexapro, Depakote  #Hypokalemia: Replaced.  Will prescribe scheduled replacement for 5 days.  Patient is chronically sick with advanced dementia and needing total care.  Medically stable.  Discharge Diagnoses:  Principal Problem:   Acute lower UTI Active Problems:   Diabetes ([place])   Alzheimer's disease ([place])   Depression   Acute encephalopathy   SIRS (systemic inflammatory response syndrome) ([place])   Metabolic encephalopathy    Discharge Instructions  Discharge Instructions    Diet general   Complete by: As directed    Mechanical soft diet, nectar thick liquids, speech follow-up to advance diet.   Increase activity slowly   Complete by: As directed      Allergies as of 02/09/2019      Reactions   Seasonal Ic [cholestatin]    Runny nose, itchy eyes   Poison Ivy Extract [poison Ivy Extract] Rash      Medication List    TAKE these medications   cefdinir 300 MG capsule Commonly known as: OMNICEF Take 1 capsule (300 mg total) by mouth every 12 (twelve) hours for 4 days.   divalproex 500 MG DR tablet Commonly known as: Depakote Take 1 tablet (500 mg total) by mouth 2 (two) times daily.   escitalopram 10 MG tablet Commonly known as: Lexapro Take 1 tablet (10 mg total) by mouth daily.   loratadine 10 MG tablet Commonly known as: CLARITIN Take 10 mg by mouth daily.   memantine 28 MG Cp24 24 hr capsule Commonly known as: Namenda XR Take 1 capsule (28 mg total) by mouth daily.   potassium chloride 10 MEQ tablet Commonly known as: KLOR-CON Take 1 tablet (10  mEq total) by mouth daily for 4 days.   saccharomyces boulardii 250 MG capsule Commonly known as: FLORASTOR Take 250 mg by mouth daily.       Allergies  Allergen Reactions  . Seasonal Ic [Cholestatin]      Runny nose, itchy eyes  . Poison Ivy Extract [Poison Ivy Extract] Rash    Consultations:  None   Procedures/Studies: CT Head Wo Contrast  Result Date: 02/05/2019 CLINICAL DATA:  Early-onset dementia. Mental status change. Malodorous urine. EXAM: CT HEAD WITHOUT CONTRAST TECHNIQUE: Contiguous axial images were obtained from the base of the skull through the vertex without intravenous contrast. COMPARISON:  February 02, 2019 FINDINGS: Brain: No subdural, epidural, or subarachnoid hemorrhage. Ventricles and sulci are prominent but stable. Cerebellum, brainstem, and basal cisterns are normal. No mass effect or midline shift. White matter changes again noted. No acute cortical ischemia or infarct. Vascular: No hyperdense vessel or unexpected calcification. Skull: Normal. Negative for fracture or focal lesion. Sinuses/Orbits: No acute finding. Other: None. IMPRESSION: No acute intracranial abnormalities or changes. Electronically Signed   By: Dorise Bullion III M.D   On: 02/05/2019 19:43   CT Head Wo Contrast  Result Date: 02/02/2019 CLINICAL DATA:  Altered mental status.  Possible seizure. EXAM: CT HEAD WITHOUT CONTRAST TECHNIQUE: Contiguous axial images were obtained from the base of the skull through the vertex without intravenous contrast. COMPARISON:  MR brain dated January 02, 2019. CT head dated January 01, 2019. FINDINGS: Brain: No evidence of acute infarction, hemorrhage, hydrocephalus, extra-axial collection or mass lesion/mass effect. Unchanged severe cerebral atrophy. Vascular: Calcified atherosclerosis at the skullbase. No hyperdense vessel. Skull: Normal. Negative for fracture or focal lesion. Sinuses/Orbits: No acute finding. Other: None. IMPRESSION: 1.  No acute intracranial abnormality. Electronically Signed   By: Titus Dubin M.D.   On: 02/02/2019 12:45   DG Chest Port 1 View  Result Date: 02/05/2019 CLINICAL DATA:  64 year old female with altered mental status and fever. History of  dementia. EXAM: PORTABLE CHEST 1 VIEW COMPARISON:  Portable chest 02/02/2019 and earlier. FINDINGS: Portable AP semi upright view at 1922 hours. Lower lung volumes. Normal cardiac size and mediastinal contours. Visualized tracheal air column is within normal limits. Allowing for portable technique the lungs remain clear. Paucity of bowel gas in the upper abdomen. No acute osseous abnormality identified. IMPRESSION: Lower lung volumes.  No acute cardiopulmonary abnormality. Electronically Signed   By: Genevie Ann M.D.   On: 02/05/2019 19:38   DG Chest Port 1 View  Result Date: 02/02/2019 CLINICAL DATA:  Altered mental status with seizure EXAM: PORTABLE CHEST 1 VIEW COMPARISON:  January 06, 2019 FINDINGS: No edema or consolidation. Heart size and pulmonary vascularity are normal. No adenopathy. No bone lesions. No pneumothorax. There is apparent calcification in the left carotid artery. IMPRESSION: No edema or consolidation. Cardiac silhouette within normal limits. Calcification in left carotid artery. Electronically Signed   By: Lowella Grip III M.D.   On: 02/02/2019 10:21     Subjective: Patient seen and examined.  Being fed by nursing staff.  She was able to eat with the help.  Not talking.  She will just have blank stare.  Occasionally follows commands but not consistent.   Discharge Exam: Vitals:   02/08/19 2024 02/09/19 0542  BP: 139/85 (!) 125/95  Pulse: 93 76  Resp: 18 16  Temp: 99.3 F (37.4 C) 98.7 F (37.1 C)  SpO2: 96% 99%   Vitals:   02/08/19 0452 02/08/19 1334 02/08/19 2024 02/09/19  0542  BP: 109/72 128/79 139/85 (!) 125/95  Pulse: 82 77 93 76  Resp: 20 17 18 16   Temp: 99.3 F (37.4 C) 99 F (37.2 C) 99.3 F (37.4 C) 98.7 F (37.1 C)  TempSrc: Oral Axillary Axillary Axillary  SpO2: 95% 98% 96% 99%  Weight:      Height:        General: Pt is alert and awake, eating breakfast with staff members.  Looks at provider but does not respond. Cardiovascular: RRR, S1/S2 +,  no rubs, no gallops Respiratory: CTA bilaterally, no wheezing, no rhonchi Abdominal: Soft, NT, ND, bowel sounds + Extremities: no edema, no cyanosis    The results of significant diagnostics from this hospitalization (including imaging, microbiology, ancillary and laboratory) are listed below for reference.     Microbiology: Recent Results (from the past 240 hour(s))  SARS CORONAVIRUS 2 (TAT 6-24 HRS) Nasopharyngeal Nasopharyngeal Swab     Status: None   Collection Time: 02/02/19  6:50 PM   Specimen: Nasopharyngeal Swab  Result Value Ref Range Status   SARS Coronavirus 2 NEGATIVE NEGATIVE Final    Comment: (NOTE) SARS-CoV-2 target nucleic acids are NOT DETECTED. The SARS-CoV-2 RNA is generally detectable in upper and lower respiratory specimens during the acute phase of infection. Negative results do not preclude SARS-CoV-2 infection, do not rule out co-infections with other pathogens, and should not be used as the sole basis for treatment or other patient management decisions. Negative results must be combined with clinical observations, patient history, and epidemiological information. The expected result is Negative. Fact Sheet for Patients: SugarRoll.be Fact Sheet for Healthcare Providers: https://www.woods-mathews.com/ This test is not yet approved or cleared by the [place] FDA and  has been authorized for detection and/or diagnosis of SARS-CoV-2 by FDA under an Emergency Use Authorization (EUA). This EUA will remain  in effect (meaning this test can be used) for the duration of the COVID-19 declaration under Section 56 4(b)(1) of the Act, 21 U.S.C. section 360bbb-3(b)(1), unless the authorization is terminated or revoked sooner. Performed at [facility], [place] [address], [place], [place] [address]   Urine culture     Status: Abnormal   Collection Time: 02/05/19  6:56 PM   Specimen: In/Out Cath Urine  Result Value  Ref Range Status   Specimen Description   Final    IN/OUT CATH URINE Performed at [facility] [address] [place], [place] [address]    Special Requests   Final    NONE Performed at [facility], [place] [address], [place], [place] [address]    Culture (A)  Final    >=100,000 COLONIES/mL KLEBSIELLA PNEUMONIAE 80,000 COLONIES/mL AEROCOCCUS SPECIES Standardized susceptibility testing for this organism is not available. Performed at [facility], [place] [address], [place], [place] [address]    Report Status 02/09/2019 FINAL  Final   Organism ID, Bacteria KLEBSIELLA PNEUMONIAE (A)  Final      Susceptibility   Klebsiella pneumoniae - MIC*    AMPICILLIN >=32 RESISTANT Resistant     CEFAZOLIN <=4 SENSITIVE Sensitive     CEFTRIAXONE <=0.25 SENSITIVE Sensitive     CIPROFLOXACIN <=0.25 SENSITIVE Sensitive     GENTAMICIN <=1 SENSITIVE Sensitive     IMIPENEM <=0.25 SENSITIVE Sensitive     NITROFURANTOIN 64 INTERMEDIATE Intermediate     TRIMETH/SULFA <=20 SENSITIVE Sensitive     AMPICILLIN/SULBACTAM 4 SENSITIVE Sensitive     PIP/TAZO <=4 SENSITIVE Sensitive     * >=  100,000 COLONIES/mL KLEBSIELLA PNEUMONIAE  Blood Culture (routine x 2)     Status: None (Preliminary result)   Collection Time: 02/05/19  7:01 PM   Specimen: BLOOD  Result Value Ref Range Status   Specimen Description   Final    BLOOD RIGHT ANTECUBITAL Performed at [facility], [place] [address], [place], [place] [address]    Special Requests   Final    BOTTLES DRAWN AEROBIC AND ANAEROBIC Blood Culture adequate volume Performed at [facility] [address] [place], [place] [address]    Culture   Final    NO GROWTH 3 DAYS Performed at [facility], [place] [address] [place], [place] [address]    Report Status PENDING  Incomplete     Labs: BNP (last 3 results) No results for input(s): BNP in the last 8760 hours. Basic Metabolic Panel: Recent  Labs  Lab 02/05/19 1913 02/06/19 0601 02/08/19 0620  NA 141 142 139  K 3.8 4.6 3.4*  CL 99 106 104  CO2 28 25 26   GLUCOSE 142* 101* 83  BUN 13 11 8   CREATININE 0.75 0.54 0.60  CALCIUM 9.9 8.6* 8.3*   Liver Function Tests: Recent Labs  Lab 02/05/19 1913  AST 40  ALT 84*  ALKPHOS 67  BILITOT 0.7  PROT 8.2*  ALBUMIN 4.2   Recent Labs  Lab 02/05/19 1913  LIPASE 17   Recent Labs  Lab 02/02/19 1013  AMMONIA 29   CBC: Recent Labs  Lab 02/05/19 1913 02/06/19 0601 02/08/19 0620  WBC 14.4* 12.0* 7.8  NEUTROABS 11.6*  --  4.3  HGB 15.9* 12.8 11.3*  HCT 50.2* 39.5 36.3  MCV 92.1 90.2 92.6  PLT 377 255 251   Cardiac Enzymes: No results for input(s): CKTOTAL, CKMB, CKMBINDEX, TROPONINI in the last 168 hours. BNP: Invalid input(s): POCBNP CBG: Recent Labs  Lab 02/08/19 0746 02/08/19 1149 02/08/19 1629 02/08/19 2027 02/09/19 0756  GLUCAP 76 73 80 91 82   D-Dimer No results for input(s): DDIMER in the last 72 hours. Hgb A1c No results for input(s): HGBA1C in the last 72 hours. Lipid Profile No results for input(s): CHOL, HDL, LDLCALC, TRIG, CHOLHDL, LDLDIRECT in the last 72 hours. Thyroid function studies No results for input(s): TSH, T4TOTAL, T3FREE, THYROIDAB in the last 72 hours.  Invalid input(s): FREET3 Anemia work up No results for input(s): VITAMINB12, FOLATE, FERRITIN, TIBC, IRON, RETICCTPCT in the last 72 hours. Urinalysis    Component Value Date/Time   COLORURINE AMBER (A) 02/05/2019 1839   APPEARANCEUR CLOUDY (A) 02/05/2019 1839   LABSPEC 1.028 02/05/2019 1839   PHURINE 6.0 02/05/2019 1839   GLUCOSEU NEGATIVE 02/05/2019 1839   HGBUR NEGATIVE 02/05/2019 1839   BILIRUBINUR NEGATIVE 02/05/2019 1839   KETONESUR 20 (A) 02/05/2019 1839   PROTEINUR 100 (A) 02/05/2019 1839   NITRITE NEGATIVE 02/05/2019 1839   LEUKOCYTESUR TRACE (A) 02/05/2019 1839   Sepsis Labs Invalid input(s): PROCALCITONIN,  WBC,  LACTICIDVEN Microbiology Recent Results  (from the past 240 hour(s))  SARS CORONAVIRUS 2 (TAT 6-24 HRS) Nasopharyngeal Nasopharyngeal Swab     Status: None   Collection Time: 02/02/19  6:50 PM   Specimen: Nasopharyngeal Swab  Result Value Ref Range Status   SARS Coronavirus 2 NEGATIVE NEGATIVE Final    Comment: (NOTE) SARS-CoV-2 target nucleic acids are NOT DETECTED. The SARS-CoV-2 RNA is generally detectable in upper and lower respiratory specimens during the acute phase of infection. Negative results do not preclude SARS-CoV-2 infection,  do not rule out co-infections with other pathogens, and should not be used as the sole basis for treatment or other patient management decisions. Negative results must be combined with clinical observations, patient history, and epidemiological information. The expected result is Negative. Fact Sheet for Patients: SugarRoll.be Fact Sheet for Healthcare Providers: https://www.woods-mathews.com/ This test is not yet approved or cleared by the [place] FDA and  has been authorized for detection and/or diagnosis of SARS-CoV-2 by FDA under an Emergency Use Authorization (EUA). This EUA will remain  in effect (meaning this test can be used) for the duration of the COVID-19 declaration under Section 56 4(b)(1) of the Act, 21 U.S.C. section 360bbb-3(b)(1), unless the authorization is terminated or revoked sooner. Performed at [facility], [place] [address] [place], [place] [address]   Urine culture     Status: Abnormal   Collection Time: 02/05/19  6:56 PM   Specimen: In/Out Cath Urine  Result Value Ref Range Status   Specimen Description   Final    IN/OUT CATH URINE Performed at [facility] [address], [place], [place] [address]    Special Requests   Final    NONE Performed at [facility], [place] [address], [place], [place] [address]    Culture (A)  Final    >=100,000 COLONIES/mL KLEBSIELLA  PNEUMONIAE 80,000 COLONIES/mL AEROCOCCUS SPECIES Standardized susceptibility testing for this organism is not available. Performed at [facility], [place] [address], [place], [place] [address]    Report Status 02/09/2019 FINAL  Final   Organism ID, Bacteria KLEBSIELLA PNEUMONIAE (A)  Final      Susceptibility   Klebsiella pneumoniae - MIC*    AMPICILLIN >=32 RESISTANT Resistant     CEFAZOLIN <=4 SENSITIVE Sensitive     CEFTRIAXONE <=0.25 SENSITIVE Sensitive     CIPROFLOXACIN <=0.25 SENSITIVE Sensitive     GENTAMICIN <=1 SENSITIVE Sensitive     IMIPENEM <=0.25 SENSITIVE Sensitive     NITROFURANTOIN 64 INTERMEDIATE Intermediate     TRIMETH/SULFA <=20 SENSITIVE Sensitive     AMPICILLIN/SULBACTAM 4 SENSITIVE Sensitive     PIP/TAZO <=4 SENSITIVE Sensitive     * >=100,000 COLONIES/mL KLEBSIELLA PNEUMONIAE  Blood Culture (routine x 2)     Status: None (Preliminary result)   Collection Time: 02/05/19  7:01 PM   Specimen: BLOOD  Result Value Ref Range Status   Specimen Description   Final    BLOOD RIGHT ANTECUBITAL Performed at [facility], [place] [address], [place], [place] [address]    Special Requests   Final    BOTTLES DRAWN AEROBIC AND ANAEROBIC Blood Culture adequate volume Performed at [facility] [address], [place], [place] [address]    Culture   Final    NO GROWTH 3 DAYS Performed at [facility], [place] [address], [place], [place] [address]    Report Status PENDING  Incomplete     Time coordinating discharge:  40 minutes  SIGNED:   [name], MD  [facility] 02/09/2019, 9:38 AM

## 2019-02-09 NOTE — Plan of Care (Signed)

## 2019-02-11 LAB — CULTURE, BLOOD (ROUTINE X 2)
Culture: NO GROWTH
Special Requests: ADEQUATE

## 2019-05-14 ENCOUNTER — Emergency Department (HOSPITAL_COMMUNITY)

## 2019-05-14 ENCOUNTER — Encounter (HOSPITAL_COMMUNITY): Payer: Self-pay | Admitting: Obstetrics and Gynecology

## 2019-05-14 ENCOUNTER — Other Ambulatory Visit: Payer: Self-pay

## 2019-05-14 ENCOUNTER — Emergency Department (HOSPITAL_COMMUNITY)
Admission: EM | Admit: 2019-05-14 | Discharge: 2019-05-14 | Disposition: A | Discharge status: Skilled Nursing Facility | Attending: Emergency Medicine | Admitting: Emergency Medicine

## 2019-05-14 DIAGNOSIS — Y92128 Other place in nursing home as the place of occurrence of the external cause: Secondary | ICD-10-CM | POA: Insufficient documentation

## 2019-05-14 DIAGNOSIS — W01198A Fall on same level from slipping, tripping and stumbling with subsequent striking against other object, initial encounter: Secondary | ICD-10-CM | POA: Insufficient documentation

## 2019-05-14 DIAGNOSIS — Y9389 Activity, other specified: Secondary | ICD-10-CM | POA: Insufficient documentation

## 2019-05-14 DIAGNOSIS — E860 Dehydration: Secondary | ICD-10-CM | POA: Insufficient documentation

## 2019-05-14 DIAGNOSIS — F0390 Unspecified dementia without behavioral disturbance: Secondary | ICD-10-CM | POA: Diagnosis present

## 2019-05-14 DIAGNOSIS — Y999 Unspecified external cause status: Secondary | ICD-10-CM | POA: Diagnosis not present

## 2019-05-14 DIAGNOSIS — G309 Alzheimer's disease, unspecified: Secondary | ICD-10-CM | POA: Diagnosis not present

## 2019-05-14 DIAGNOSIS — I10 Essential (primary) hypertension: Secondary | ICD-10-CM | POA: Diagnosis not present

## 2019-05-14 DIAGNOSIS — E870 Hyperosmolality and hypernatremia: Secondary | ICD-10-CM | POA: Insufficient documentation

## 2019-05-14 DIAGNOSIS — W19XXXA Unspecified fall, initial encounter: Secondary | ICD-10-CM

## 2019-05-14 DIAGNOSIS — F17200 Nicotine dependence, unspecified, uncomplicated: Secondary | ICD-10-CM | POA: Insufficient documentation

## 2019-05-14 LAB — COMPREHENSIVE METABOLIC PANEL
ALT: 18 U/L (ref 0–44)
AST: 16 U/L (ref 15–41)
Albumin: 3.8 g/dL (ref 3.5–5.0)
Alkaline Phosphatase: 47 U/L (ref 38–126)
Anion gap: 14 (ref 5–15)
BUN: 14 mg/dL (ref 8–23)
CO2: 30 mmol/L (ref 22–32)
Calcium: 9 mg/dL (ref 8.9–10.3)
Chloride: 104 mmol/L (ref 98–111)
Creatinine, Ser: 0.64 mg/dL (ref 0.44–1.00)
GFR calc Af Amer: >60 mL/min (ref >60)
GFR calc non Af Amer: >60 mL/min (ref >60)
Glucose, Bld: 101 mg/dL — ABNORMAL HIGH (ref 70–99)
Potassium: 3.8 mmol/L (ref 3.5–5.1)
Sodium: 148 mmol/L — ABNORMAL HIGH (ref 135–145)
Total Bilirubin: 0.5 mg/dL (ref 0.3–1.2)
Total Protein: 6.9 g/dL (ref 6.5–8.1)

## 2019-05-14 LAB — URINALYSIS, ROUTINE W REFLEX MICROSCOPIC
Bilirubin Urine: NEGATIVE
Glucose, UA: NEGATIVE mg/dL
Ketones, ur: 20 mg/dL — AB
Nitrite: NEGATIVE
Protein, ur: 100 mg/dL — AB
RBC / HPF: >50 RBC/hpf — ABNORMAL HIGH (ref 0–5)
Specific Gravity, Urine: 1.033 — ABNORMAL HIGH (ref 1.005–1.030)
pH: 5 (ref 5.0–8.0)

## 2019-05-14 LAB — CBC
HCT: 49.6 % — ABNORMAL HIGH (ref 36.0–46.0)
Hemoglobin: 15.6 g/dL — ABNORMAL HIGH (ref 12.0–15.0)
MCH: 29.3 pg (ref 26.0–34.0)
MCHC: 31.5 g/dL (ref 30.0–36.0)
MCV: 93.1 fL (ref 80.0–100.0)
Platelets: 199 10*3/uL (ref 150–400)
RBC: 5.33 MIL/uL — ABNORMAL HIGH (ref 3.87–5.11)
RDW: 14.2 % (ref 11.5–15.5)
WBC: 8.8 10*3/uL (ref 4.0–10.5)
nRBC: 0 % (ref 0.0–0.2)

## 2019-05-14 LAB — PROTIME-INR
INR: 1 (ref 0.8–1.2)
Prothrombin Time: 12.8 seconds (ref 11.4–15.2)

## 2019-05-14 LAB — LACTIC ACID, PLASMA: Lactic Acid, Venous: 1.5 mmol/L (ref 0.5–1.9)

## 2019-05-14 LAB — TROPONIN I (HIGH SENSITIVITY): Troponin I (High Sensitivity): 5 ng/L (ref <18)

## 2019-05-14 MED ORDER — SODIUM CHLORIDE 0.9 % IV BOLUS
1000.0000 mL | Freq: Once | INTRAVENOUS | Status: AC
  Administered 2019-05-14: 1000 mL via INTRAVENOUS

## 2019-05-14 NOTE — ED Notes (Signed)
[facility] called for Transport. Report called to [facility]. Husband made aware patient is to be discharged back to [facility].

## 2019-05-14 NOTE — ED Notes (Signed)
RN updated husband on patient condition and status.

## 2019-05-14 NOTE — ED Notes (Signed)
RN called patient's husband and told him that his wife is here and that the doctor or RN will call him with updates. Patient's emergency contact reports understanding.

## 2019-05-14 NOTE — Progress Notes (Signed)
Manufacturing engineer [facility])  Barbara Gill is our active hospice patient at [facility].  [facility] will follow during ED visit.  Please reach out if any questions or concerns.  Venia Carbon RN, BSN, [facility] Liaison (in [place] or [place])

## 2019-05-14 NOTE — ED Provider Notes (Signed)
Barbara Gill:  [id number]  Arrival date & time: 05/14/19     Chief Complaint   Dementia and Fall   History of Present Illness   Barbara Gill is a 64 y.o. year-old female with a history of dementia presenting to the ED with chief complaint of fall.  Ground-level fall today, fell forward onto her forehead.  Patient is receiving hospice care, has advanced dementia.  I was unable to obtain an accurate HPI, PMH, or ROS due to the patient's dementia.  Level 5 caveat.  Review of Systems  Positive for fall, dementia.  Patient's Health History    Past Medical History:  Diagnosis Date  . Alzheimer's disease ([place])   . High cholesterol   . Hyperlipemia   . Hypertension     No past surgical history on file.  Family History  Problem Relation Age of Onset  . Cancer Father   . Hypothyroidism Father   . Hypertension Mother        borderline  . Diabetes Mother   . Breast cancer Mother     Social History   Socioeconomic History  . Marital status: Married    Spouse name: [name]  . Number of children: 2  . Years of education: 8  . Highest education level: Not on file  Occupational History    Comment: not employed  Tobacco Use  . Smoking status: Current Every Day Smoker  . Smokeless tobacco: Never Used  . Tobacco comment: 1 pack weekly   Substance and Sexual Activity  . Alcohol use: Yes    Alcohol/week: 0.0 standard drinks    Comment: occas.,once every two months  . Drug use: No  . Sexual activity: Not on file  Other Topics Concern  . Not on file  Social History Narrative   Patient is right handed and resides with husband   Social Determinants of Health   Financial Resource Strain:   . Difficulty of Paying Living Expenses:   Food Insecurity:   . Worried About Charity fundraiser in the Last Year:   . Arboriculturist in the Last Year:   Transportation Needs:   . Film/video editor (Medical):   Marland Kitchen  Lack of Transportation (Non-Medical):   Physical Activity:   . Days of Exercise per Week:   . Minutes of Exercise per Session:   Stress:   . Feeling of Stress :   Social Connections:   . Frequency of Communication with Friends and Family:   . Frequency of Social Gatherings with Friends and Family:   . Attends Religious Services:   . Active Member of Clubs or Organizations:   . Attends Archivist Meetings:   Marland Kitchen Marital Status:   Intimate Partner Violence:   . Fear of Current or Ex-Partner:   . Emotionally Abused:   Marland Kitchen Physically Abused:   . Sexually Abused:      Physical Exam   Vitals:   05/14/19 1545 05/14/19 1615  BP: 107/76 114/67  Pulse: (!) 59 66  Resp: 15 13  Temp:    SpO2: 97% 96%    CONSTITUTIONAL: Chronically ill-appearing, NAD NEURO: Alert, awake, stares into space, blinks to threat, moves all extremities and responds to painful stimuli EYES:  eyes equal and reactive ENT/NECK:  no LAD, no JVD CARDIO: Regular rate, well-perfused, normal S1 and S2 PULM:  CTAB no wheezing or rhonchi GI/GU:  normal bowel sounds, non-distended, non-tender MSK/SPINE:  No gross  deformities, no edema SKIN:  no rash, atraumatic PSYCH:  Appropriate speech and behavior  *Additional and/or pertinent findings included in MDM below  Diagnostic and Interventional Summary    EKG Interpretation  Date/Time:  Sunday May 14 2019 13:28:57 EDT Ventricular Rate:  67 PR Interval:    QRS Duration: 110 QT Interval:  560 QTC Calculation: 587 R Axis:   -67 Text Interpretation: Sinus rhythm Ventricular premature complex Abnormal R-wave progression, early transition Inferior infarct, old Consider anterior infarct Prolonged QT interval Confirmed by Gerlene Fee [phone]) on 05/14/2019 4:32:33 PM      Labs Reviewed  CBC - Abnormal; Notable for the following components:      Result Value   RBC 5.33 (*)    Hemoglobin 15.6 (*)    HCT 49.6 (*)    All other components within normal limits    COMPREHENSIVE METABOLIC PANEL - Abnormal; Notable for the following components:   Sodium 148 (*)    Glucose, Bld 101 (*)    All other components within normal limits  URINALYSIS, ROUTINE W REFLEX MICROSCOPIC - Abnormal; Notable for the following components:   APPearance TURBID (*)    Specific Gravity, Urine 1.033 (*)    Hgb urine dipstick Gill (*)    Ketones, ur 20 (*)    Protein, ur 100 (*)    Leukocytes,Ua TRACE (*)    RBC / HPF >50 (*)    Bacteria, UA MANY (*)    All other components within normal limits  CULTURE, BLOOD (SINGLE)  LACTIC ACID, PLASMA  PROTIME-INR  TROPONIN I (HIGH SENSITIVITY)  TROPONIN I (HIGH SENSITIVITY)    CT Head Wo Contrast  Final Result    CT CERVICAL SPINE WO CONTRAST  Final Result    DG Chest Port 1 View  Final Result      Medications  sodium chloride 0.9 % bolus 1,000 mL (1,000 mLs Intravenous New Bag/Given 05/14/19 1333)     Procedures  /  Critical Care Procedures  ED Course and Medical Decision Making  I have reviewed the triage vital signs, the nursing notes, and pertinent available records from the EMR.  Listed above are laboratory and imaging tests that I personally ordered, reviewed, and interpreted and then considered in my medical decision making (see below for details).      Ground-level fall, which reportedly happens frequently in this 64 year old female with advanced dementia.  Will obtain CT imaging to exclude intracranial bleeding and likely discharge back to care facility.  Patient exhibited brief hypotension, which triggered further evaluation and IV fluid resuscitation.  Quick return to her baseline blood pressure after liter bolus.  No fever, labs reveal hyponatremia.  Suspect dehydration as the cause.  No indication for admission or further testing, appropriate for discharge.  Barbara Gill. Barbara Gill, [place] [email or web address]  Final Clinical Impressions(s) / ED  Diagnoses     ICD-10-CM   1. Fall, initial encounter  W19.XXXA   2. Dehydration  E86.0   3. Hypernatremia  E87.0     ED Discharge Orders    None       Discharge Instructions Discussed with and Provided to Patient:     Discharge Instructions     You were evaluated in the Emergency Department and after careful evaluation, we did not find any emergent condition requiring admission or further testing in the hospital.  Your exam/testing today was overall reassuring.  Your labs suggested you are dehydrated.  Please drink more  water during the day.  Please return to the Emergency Department if you experience any worsening of your condition.  We encourage you to follow up with a primary care provider.  Thank you for allowing [place] to be a part of your care.        Maudie Flakes, MD 05/14/19 [phone]

## 2019-05-14 NOTE — ED Triage Notes (Signed)
Patient reprots to the ED from [place] after she fell forward and hit her head. Patient is a hospice patient who is followed by [facility] and is mostly non-verbal to due late stage dementia.  Patient was witnessed falling from her wheelchair.  Patient did hit her head, no LOC, no blood thinners. CBG 110

## 2019-05-14 NOTE — Discharge Instructions (Addendum)
You were evaluated in the Emergency Department and after careful evaluation, we did not find any emergent condition requiring admission or further testing in the hospital.  Your exam/testing today was overall reassuring.  Your labs suggested you are dehydrated.  Please drink more water during the day.  Please return to the Emergency Department if you experience any worsening of your condition.  We encourage you to follow up with a primary care provider.  Thank you for allowing [place] to be a part of your care.

## 2019-05-19 LAB — CULTURE, BLOOD (SINGLE): Culture: NO GROWTH

## 2019-06-27 DEATH — deceased

## 2020-12-07 IMAGING — CR DG HIP (WITH OR WITHOUT PELVIS) 2-3V*L*
3 series · 3 of 3 positions shown · non-contrast
Comparison: None.

CLINICAL DATA: Fall and left hip pain

EXAM:
DG HIP (WITH OR WITHOUT PELVIS) 2-3V LEFT

[pelvis ap]
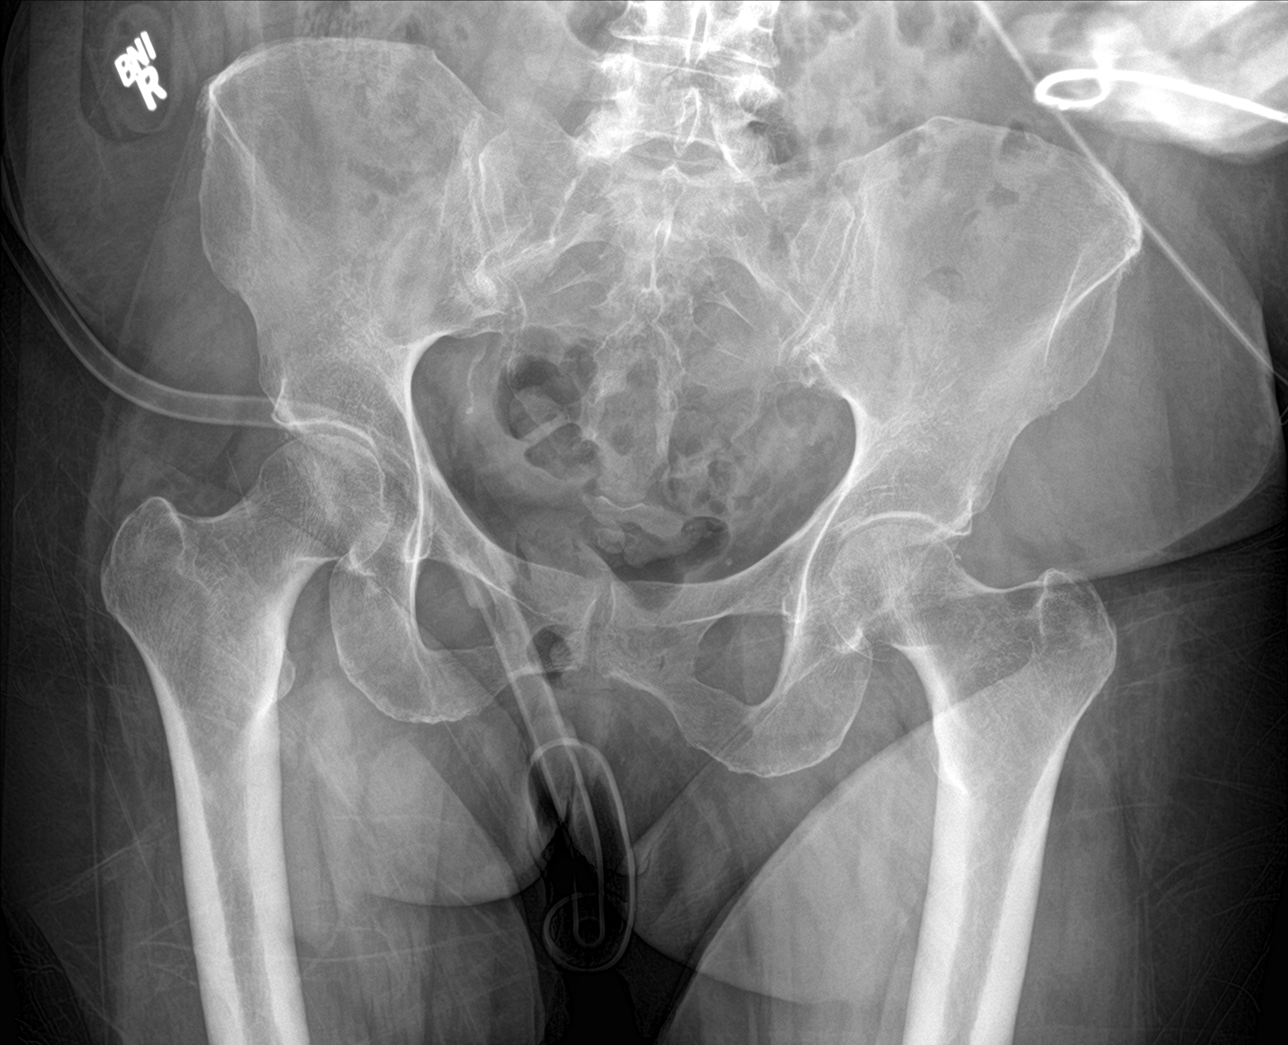

[hip ap]
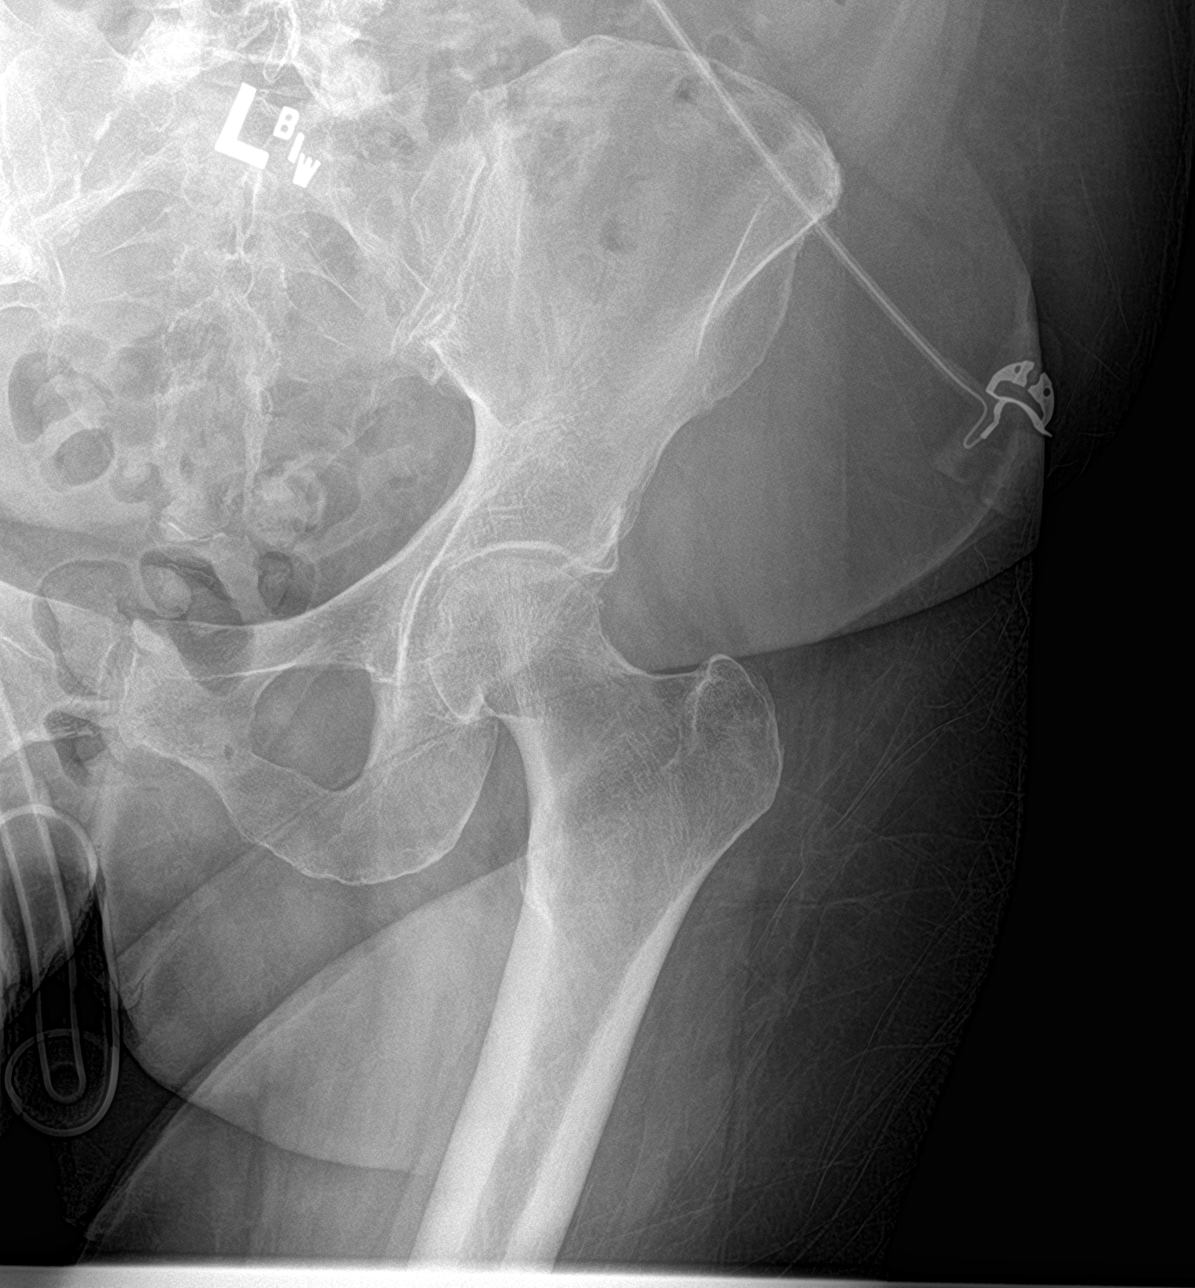

[hip lat]
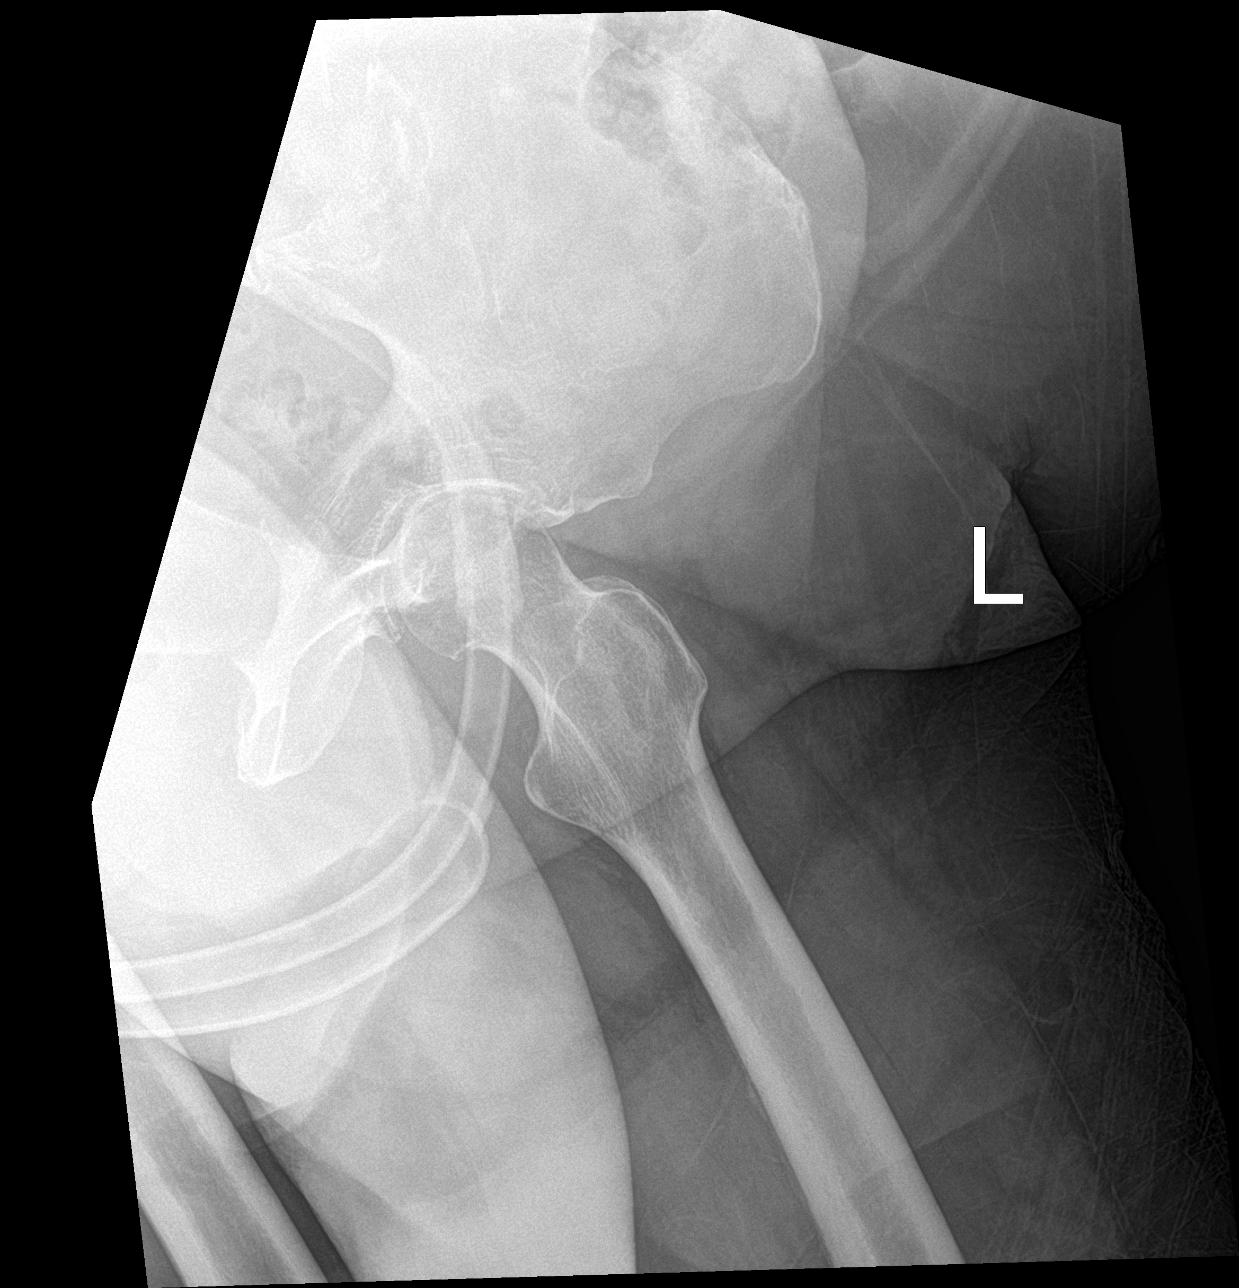

[3 of 3 positions shown; findings below may reference images not displayed]

FINDINGS: Suspected cortical irregularity in the subcapital region of the left
femoral neck seen on the AP view of the pelvis is indeterminate for
a nondisplaced subcapital fracture. This apparent cortical
irregularity is not seen on the other views of the hip. There is no
evidence of hip dislocation. Degenerative changes are seen in both
hips and in the lumbar spine.
IMPRESSION: 1. Findings indeterminate for a nondisplaced subcapital fracture of
the left femoral neck. If there is clinical concern, MR or CT of the
pelvis and left hip could be performed.

## 2020-12-08 IMAGING — MR MR HEAD W/O CM
9 of 10 series · 37 of 48 positions shown · non-contrast
Comparison: Head CT yesterday.

CLINICAL DATA: 63-year-old female with new onset altered mental
status. Encephalopathy. Alzheimer's disease.

EXAM:
MRI HEAD WITHOUT CONTRAST
TECHNIQUE: Multiplanar, multiecho pulse sequences of the brain and surrounding
structures were obtained without intravenous contrast.

[Series 3: DWI · axial · 3.0mm · 0.94mm/px · z∈[+1,+143]mm · 9 of 104 slices shown (1 of 4)]
[im 1/104]
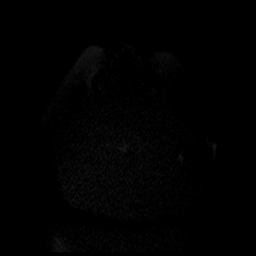
[im 13/104]
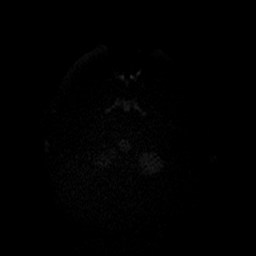
[im 26/104]
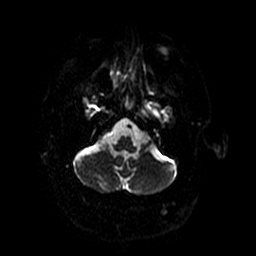
[im 39/104]
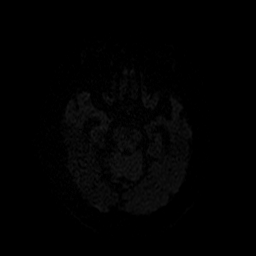
[im 52/104]
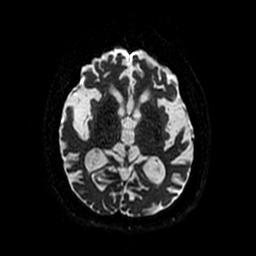
[im 65/104]
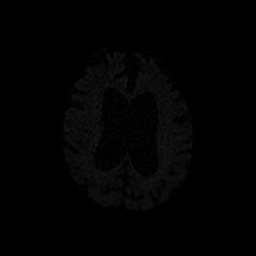
[im 78/104]
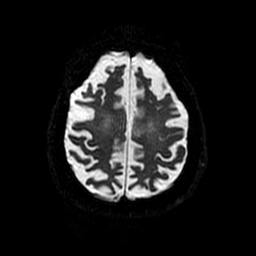
[im 91/104]
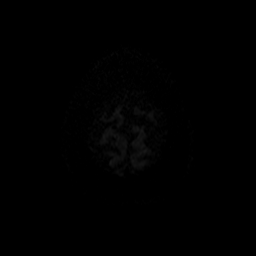
[im 104/104]
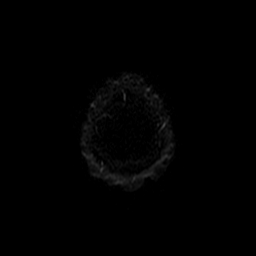

[Series 4: DWI · coronal · 5.0mm · 0.94mm/px · 6 of 78 slices shown (2 of 4)]
[im 1/78]
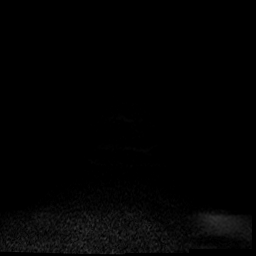
[im 16/78]
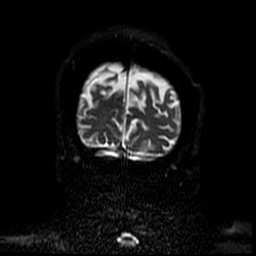
[im 31/78]
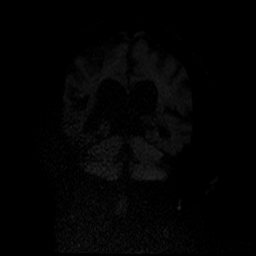
[im 47/78]
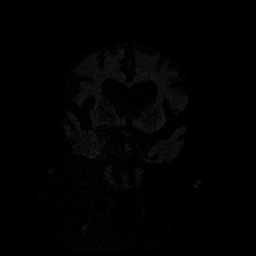
[im 62/78]
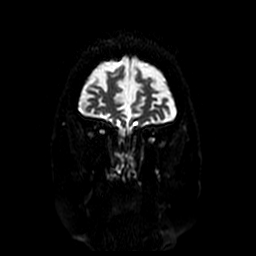
[im 78/78]
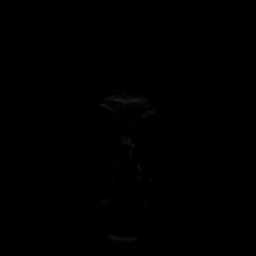

[Series 5: FLAIR · axial · 3.0mm · 0.43mm/px · z∈[-7,+137]mm · 3 of 27 slices shown]
[im 1/27]
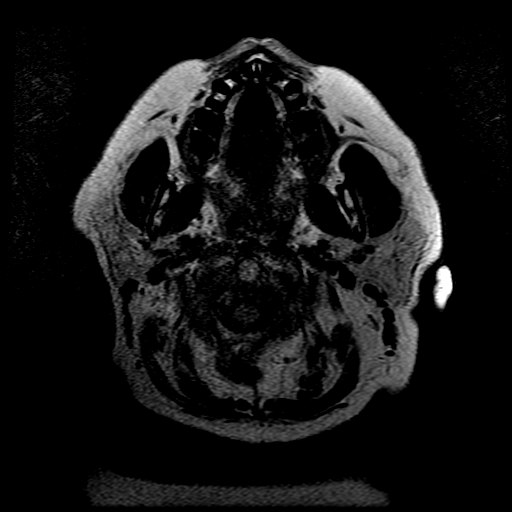
[im 14/27]
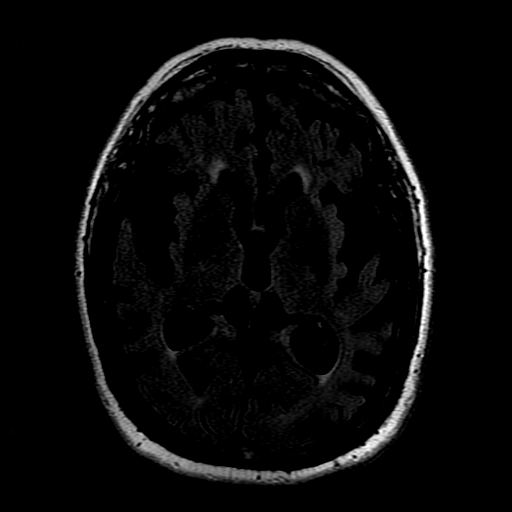
[im 27/27]
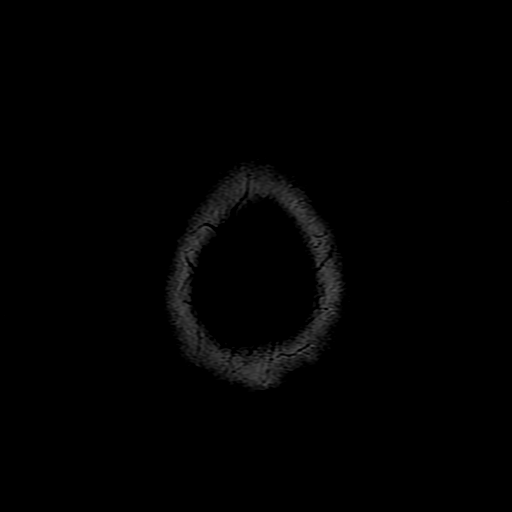

[Series 7: T1 · sagittal · 5.0mm · 0.94mm/px · 3 of 27 slices shown (1 of 2)]
[im 1/27]
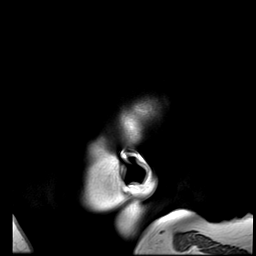
[im 14/27]
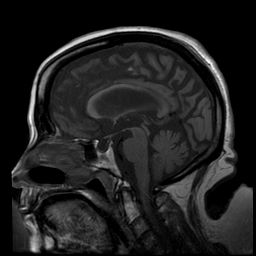
[im 27/27]
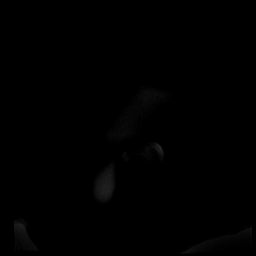

[Series 8: T2 · axial · 5.0mm · 0.47mm/px · z∈[-3,+142]mm · 3 of 27 slices shown (1 of 2)]
[im 1/27]
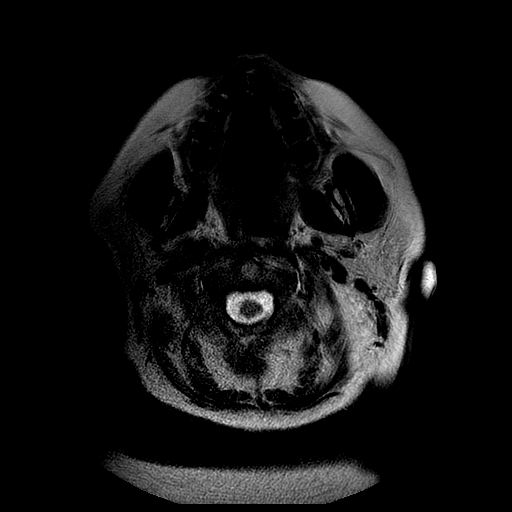
[im 14/27]
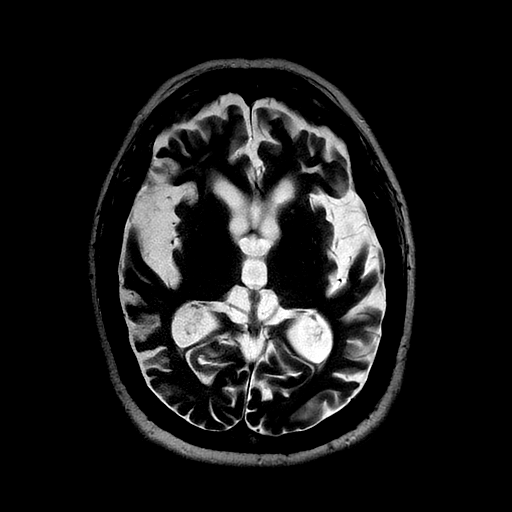
[im 27/27]
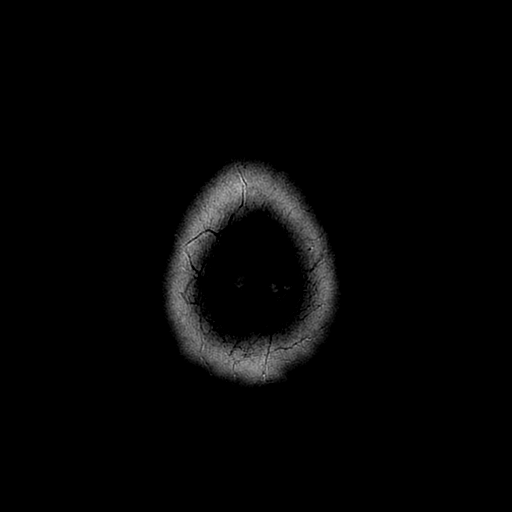

[Series 9: T1 · axial · 3.0mm · 0.47mm/px · 1 of 108 slices shown (2 of 2)]
[im 1/108]
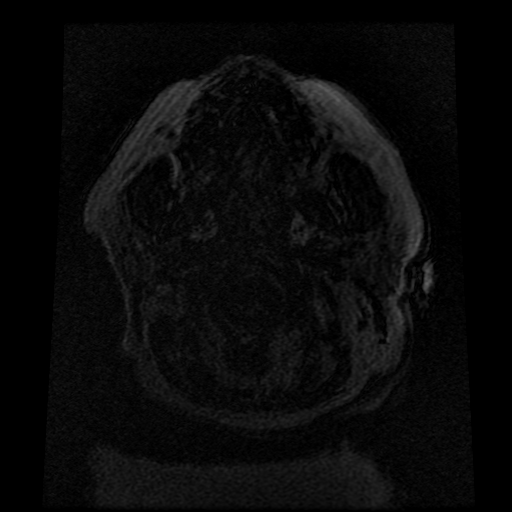

[Series 10: T2 · coronal · 5.0mm · 0.43mm/px · 3 of 33 slices shown (2 of 2)]
[im 1/33]
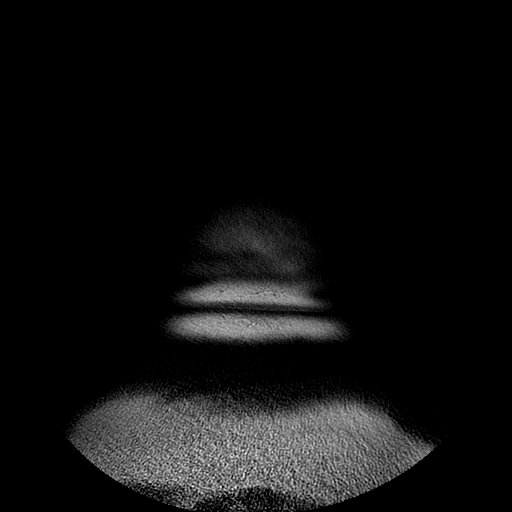
[im 17/33]
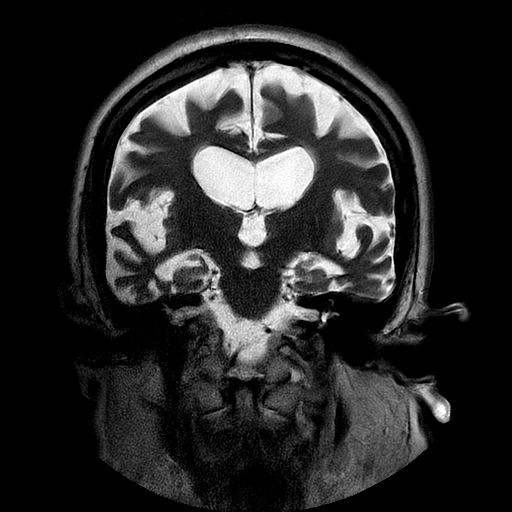
[im 33/33]
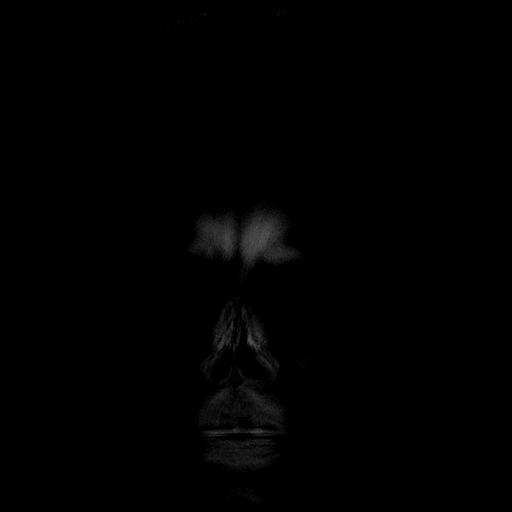

[Series 300: DWI · axial · 3.0mm · 0.94mm/px · z∈[+1,+143]mm · 5 of 51 slices shown (3 of 4)]
[im 1/51]
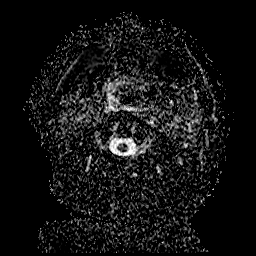
[im 13/51]
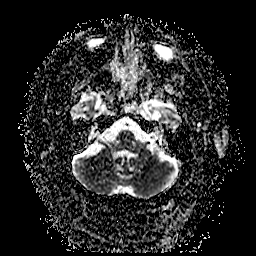
[im 26/51]
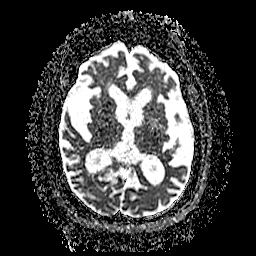
[im 38/51]
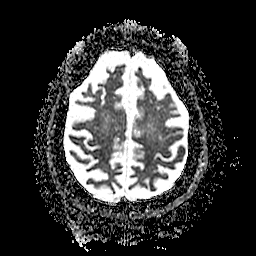
[im 51/51]
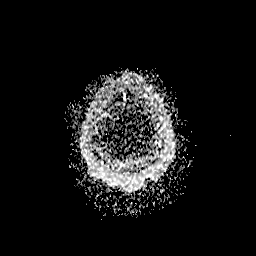

[Series 400: DWI · coronal · 5.0mm · 0.94mm/px · 4 of 39 slices shown (4 of 4)]
[im 1/39]
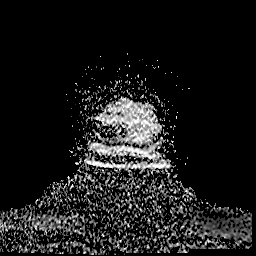
[im 13/39]
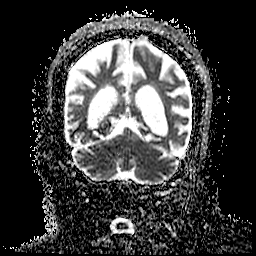
[im 26/39]
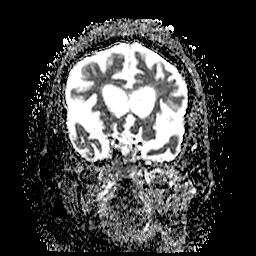
[im 39/39]
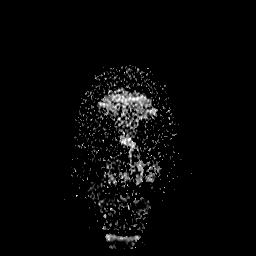

[37 of 48 positions shown; findings below may reference images not displayed]

FINDINGS: Brain: No restricted diffusion to suggest acute infarction. No
midline shift, mass effect, evidence of mass lesion, extra-axial
collection or acute intracranial hemorrhage. Cervicomedullary
junction within normal limits. Partially empty sella.

However, there is severe generalized cerebral hemisphere atrophy
which appears symmetric (series 10, image 16). Associated
ventricular enlargement appears to be ex vacuo. No superimposed
ischemic encephalomalacia or chronic cerebral blood products
identified. Signal in the deep gray nuclei, brainstem and cerebellum
remains normal. Brainstem and cerebellar volume is also better
preserved.

Vascular: Major intracranial vascular flow voids are preserved.

Skull and upper cervical spine: Negative visible cervical spine.
Visualized bone marrow signal is within normal limits.

Sinuses/Orbits: Negative orbits. Mild to moderate bilateral
paranasal sinus mucosal thickening. No sinus fluid level.

Other: Mastoids are clear. Visible internal auditory structures
appear normal. Scalp and face soft tissues appear negative.
IMPRESSION: 1. No acute intracranial abnormality.
2. Severe generalized cerebral atrophy probably related to advanced
Alzheimer's disease.
3. Mild paranasal sinus inflammation.

## 2020-12-09 IMAGING — DX DG CHEST 1V PORT
1 series · 1 of 1 positions shown · non-contrast
Comparison: Radiograph 01/01/2019

CLINICAL DATA: Altered mental status, fever, [id number] positive

EXAM:
PORTABLE CHEST 1 VIEW

[chest ap]
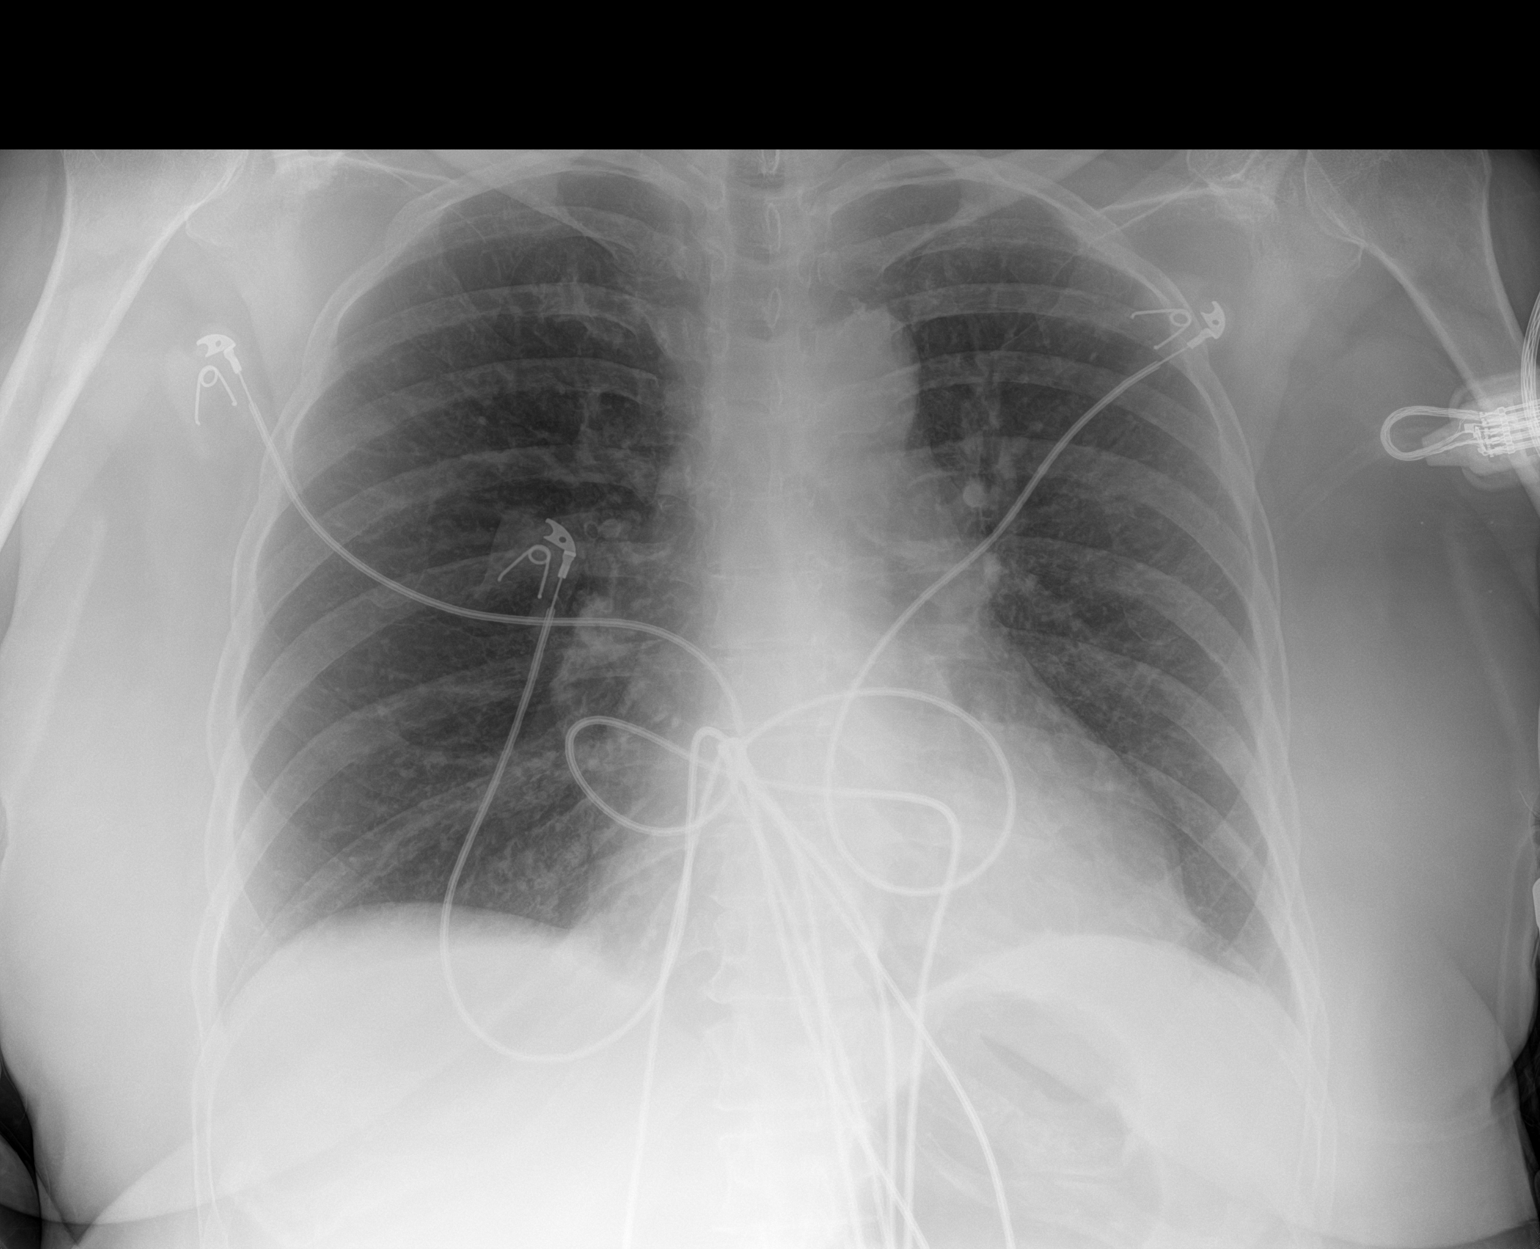

[1 of 1 positions shown; findings below may reference images not displayed]

FINDINGS: No consolidation, features of edema, pneumothorax, or effusion.
Pulmonary vascularity is normally distributed. The cardiomediastinal
contours are unremarkable. No acute osseous or soft tissue
abnormality.
IMPRESSION: No acute cardiopulmonary abnormality.

## 2020-12-12 IMAGING — DX DG CHEST 1V PORT
1 series · 1 of 1 positions shown · non-contrast
Comparison: January 03, 2019

CLINICAL DATA: Hypoxia.  Altered mental status.

EXAM:
PORTABLE CHEST 1 VIEW

[chest]
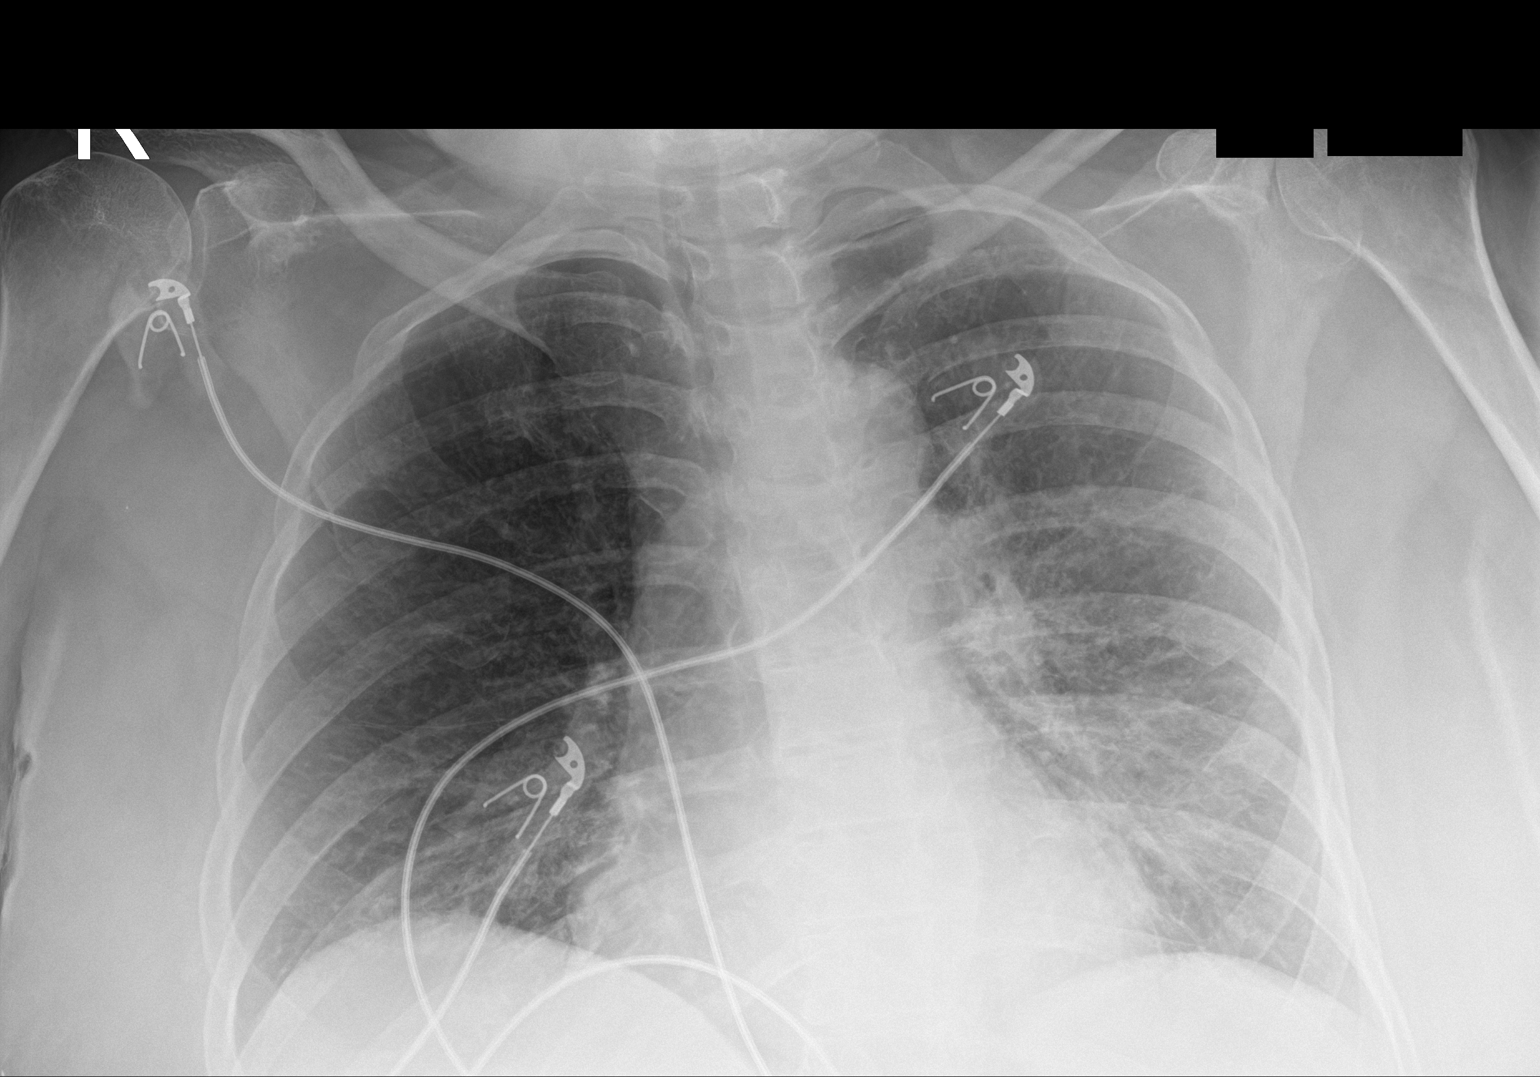

[1 of 1 positions shown; findings below may reference images not displayed]

FINDINGS: Developing opacity in the left lung, diffusely. The opacity is more
focal in the region of the lingula. The cardiomediastinal silhouette
is stable. Mild opacity seen in the right base. No other acute
abnormalities.
IMPRESSION: Developing opacities bilaterally, diffusely on the left and more
focal in the right base. The findings may represent developing
multifocal pneumonia in the appropriate clinical setting. Recommend
clinical correlation.
